# Patient Record
Sex: Female | Born: 1990 | Race: White | Hispanic: No | Marital: Married | Smoking: Never smoker
Health system: Southern US, Community
[De-identification: ages and names within clinical notes are randomized; demographics above are authoritative.]

## PROBLEM LIST (undated history)

## (undated) DIAGNOSIS — D219 Benign neoplasm of connective and other soft tissue, unspecified: Secondary | ICD-10-CM

## (undated) DIAGNOSIS — F419 Anxiety disorder, unspecified: Secondary | ICD-10-CM

## (undated) DIAGNOSIS — O139 Gestational [pregnancy-induced] hypertension without significant proteinuria, unspecified trimester: Secondary | ICD-10-CM

## (undated) DIAGNOSIS — F32A Depression, unspecified: Secondary | ICD-10-CM

## (undated) DIAGNOSIS — K219 Gastro-esophageal reflux disease without esophagitis: Secondary | ICD-10-CM

## (undated) DIAGNOSIS — Z8619 Personal history of other infectious and parasitic diseases: Secondary | ICD-10-CM

## (undated) HISTORY — DX: Anxiety disorder, unspecified: F41.9

## (undated) HISTORY — DX: Benign neoplasm of connective and other soft tissue, unspecified: D21.9

## (undated) HISTORY — DX: Gestational (pregnancy-induced) hypertension without significant proteinuria, unspecified trimester: O13.9

## (undated) HISTORY — DX: Personal history of other infectious and parasitic diseases: Z86.19

## (undated) HISTORY — PX: NECK SURGERY: SHX720

## (undated) HISTORY — DX: Depression, unspecified: F32.A

## (undated) HISTORY — DX: Gastro-esophageal reflux disease without esophagitis: K21.9

---
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
## 2010-10-11 LAB — HM PAP SMEAR: HM Pap smear: NEGATIVE

## 2012-04-10 HISTORY — PX: WISDOM TOOTH EXTRACTION: SHX21

## 2012-09-03 ENCOUNTER — Encounter (HOSPITAL_COMMUNITY): Payer: Self-pay | Admitting: Emergency Medicine

## 2012-09-03 ENCOUNTER — Emergency Department (HOSPITAL_COMMUNITY)
Admission: EM | Admit: 2012-09-03 | Discharge: 2012-09-03 | Disposition: A | Discharge status: Home / Self Care | Payer: 59 | Attending: Emergency Medicine | Admitting: Emergency Medicine

## 2012-09-03 ENCOUNTER — Emergency Department (HOSPITAL_COMMUNITY): Payer: 59

## 2012-09-03 DIAGNOSIS — Y9349 Activity, other involving dancing and other rhythmic movements: Secondary | ICD-10-CM | POA: Insufficient documentation

## 2012-09-03 DIAGNOSIS — S93409A Sprain of unspecified ligament of unspecified ankle, initial encounter: Secondary | ICD-10-CM | POA: Insufficient documentation

## 2012-09-03 DIAGNOSIS — Y9229 Other specified public building as the place of occurrence of the external cause: Secondary | ICD-10-CM | POA: Insufficient documentation

## 2012-09-03 DIAGNOSIS — S93401A Sprain of unspecified ligament of right ankle, initial encounter: Secondary | ICD-10-CM

## 2012-09-03 DIAGNOSIS — X503XXA Overexertion from repetitive movements, initial encounter: Secondary | ICD-10-CM | POA: Insufficient documentation

## 2012-09-03 MED ORDER — HYDROCODONE-ACETAMINOPHEN 5-325 MG PO TABS: 1.0000 | ORAL_TABLET | ORAL | Status: AC | PRN

## 2012-09-03 NOTE — ED Notes (Signed)
Pt c.o right foot/ankle pain/swelling after twisting it last night.

## 2012-09-04 NOTE — ED Provider Notes (Signed)
Medical screening examination/treatment/procedure(s) were performed by non-physician practitioner and as supervising physician I was immediately available for consultation/collaboration.   Benny Lennert, MD 09/04/12 2159

## 2012-09-04 NOTE — ED Provider Notes (Signed)
History     CSN: [id number]  Arrival date & time 09/03/12  7829   First MD Initiated Contact with Patient 09/03/12 1019      Chief Complaint  Patient presents with  . Foot Injury  . Ankle Pain    (Consider location/radiation/quality/duration/timing/severity/associated sxs/prior treatment) HPI Comments: Cynthia Robertson presents with pain and swelling of her right ankle and foot after twisting it last night during a local band performance.  She has taken naproxen and used elevation and ice without improvement in pain.  She has not been able to bear weight on the injured foot. She denies numbness or weakness distal to the injury site and denies any other injury.  Patient is a 21 y.o. female presenting with ankle pain. The history is provided by the patient and a parent.  Ankle Pain  Pertinent negatives include no numbness.    History reviewed. No pertinent past medical history.  History reviewed. No pertinent past surgical history.  No family history on file.  History  Substance Use Topics  . Smoking status: Never Smoker   . Smokeless tobacco: Not on file  . Alcohol Use: Yes     Comment: occasional    OB History    Grav Para Term Preterm Abortions TAB SAB Ect Mult Living                  Review of Systems  Musculoskeletal: Positive for joint swelling and arthralgias.  Skin: Negative for wound.  Neurological: Negative for weakness and numbness.    Allergies  Review of patient's allergies indicates no known allergies.  Home Medications   Current Outpatient Rx  Name  Route  Sig  Dispense  Refill  . NAPROXEN SODIUM 220 MG PO CAPS   Oral   Take 1 capsule by mouth daily as needed. Patient took 1 gelcap this morning for ankle pain         . NORETHIN ACE-ETH ESTRAD-FE 1-20 MG-MCG PO TABS   Oral   Take 1 tablet by mouth daily. 3 weeks on and 1 week off         . HYDROCODONE-ACETAMINOPHEN 5-325 MG PO TABS   Oral   Take 1 tablet by mouth every 4 (four) hours as  needed for pain.   15 tablet   0     BP 135/69  Pulse 96  Temp 98.4 F (36.9 C)  Resp 18  Ht 5\' 8"  (1.727 m)  Wt 192 lb (87.091 kg)  BMI 29.19 kg/m2  SpO2 98%  LMP 08/27/2012  Physical Exam  Nursing note and vitals reviewed. Constitutional: She appears well-developed and well-nourished.  HENT:  Head: Normocephalic.  Cardiovascular: Normal rate and intact distal pulses.  Exam reveals no decreased pulses.   Pulses:      Dorsalis pedis pulses are 2+ on the right side, and 2+ on the left side.       Posterior tibial pulses are 2+ on the right side, and 2+ on the left side.  Musculoskeletal: She exhibits edema and tenderness.       Right ankle: She exhibits decreased range of motion, swelling and ecchymosis. She exhibits no deformity and normal pulse. tenderness. Lateral malleolus tenderness found. No head of 5th metatarsal and no proximal fibula tenderness found. Achilles tendon normal.  Neurological: She is alert. No sensory deficit.  Skin: Skin is warm, dry and intact.    ED Course  Procedures (including critical care time)  Labs Reviewed - No data to display Dg  Ankle Complete Right  09/03/2012  *RADIOLOGY REPORT*  Clinical Data:  Fall.  Ankle injury and pain.  RIGHT ANKLE - COMPLETE 3+ VIEW  Comparison:  None.  Findings:  There is no evidence of fracture, dislocation, or joint effusion.  There is no evidence of arthropathy or other focal bone abnormality.  Soft tissues are unremarkable.  IMPRESSION: Negative.   Original Report Authenticated By: Myles Rosenthal, M.D.    Dg Foot Complete Right  09/03/2012  *RADIOLOGY REPORT*  Clinical Data: Fall.  Foot injury and pain.  RIGHT FOOT COMPLETE - 3+ VIEW  Comparison:  None.  Findings:  There is no evidence of fracture or dislocation.  There is no evidence of arthropathy or other focal bone abnormality. Mild soft tissue swelling is seen along the lateral aspect of the mid and hind foot.  IMPRESSION:  Mild lateral soft tissue swelling.  No  evidence of fracture or dislocation.   Original Report Authenticated By: Myles Rosenthal, M.D.      1. Right ankle sprain       MDM  xrays reviewed.  Pt given crutches,  [place].  Prescribed hydrocodone, encouraged continued naproxen.  RICE,  Recheck in 1 week if not improved.        [name], [place] 09/04/12 2111

## 2012-12-09 ENCOUNTER — Emergency Department (HOSPITAL_COMMUNITY)
Admission: EM | Admit: 2012-12-09 | Discharge: 2012-12-09 | Discharge status: Against Medical Advice | Payer: 59 | Attending: Emergency Medicine | Admitting: Emergency Medicine

## 2012-12-09 ENCOUNTER — Emergency Department (HOSPITAL_COMMUNITY): Payer: 59

## 2012-12-09 ENCOUNTER — Encounter (HOSPITAL_COMMUNITY): Payer: Self-pay | Admitting: Physical Medicine and Rehabilitation

## 2012-12-09 DIAGNOSIS — R000 Tachycardia, unspecified: Secondary | ICD-10-CM

## 2012-12-09 DIAGNOSIS — R002 Palpitations: Secondary | ICD-10-CM | POA: Insufficient documentation

## 2012-12-09 DIAGNOSIS — R079 Chest pain, unspecified: Secondary | ICD-10-CM | POA: Insufficient documentation

## 2012-12-09 DIAGNOSIS — Z79899 Other long term (current) drug therapy: Secondary | ICD-10-CM | POA: Insufficient documentation

## 2012-12-09 DIAGNOSIS — F410 Panic disorder [episodic paroxysmal anxiety] without agoraphobia: Secondary | ICD-10-CM | POA: Insufficient documentation

## 2012-12-09 DIAGNOSIS — R42 Dizziness and giddiness: Secondary | ICD-10-CM | POA: Insufficient documentation

## 2012-12-09 LAB — BASIC METABOLIC PANEL
BUN: 14 mg/dL (ref 6–23)
CO2: 25 mEq/L (ref 19–32)
Chloride: 102 mEq/L (ref 96–112)
GFR calc Af Amer: >90 mL/min (ref >90)
Glucose, Bld: 100 mg/dL — ABNORMAL HIGH (ref 70–99)
Potassium: 3.7 mEq/L (ref 3.5–5.1)

## 2012-12-09 LAB — CBC WITH DIFFERENTIAL/PLATELET
Basophils Relative: 1 % (ref 0–1)
HCT: 37.1 % (ref 36.0–46.0)
Hemoglobin: 12.8 g/dL (ref 12.0–15.0)
MCH: 31.8 pg (ref 26.0–34.0)
MCHC: 34.5 g/dL (ref 30.0–36.0)
Monocytes Absolute: 0.5 10*3/uL (ref 0.1–1.0)
Monocytes Relative: 6 % (ref 3–12)
Neutro Abs: 5.6 10*3/uL (ref 1.7–7.7)

## 2012-12-09 NOTE — ED Notes (Signed)
Pt presents to department for evaluation of SOB and chest tightness. Sudden onset this afternoon after eating crab meat in sushi roll. Upon arrival pt states chest tightness, heart racing and shortness of breath. Pt is conscious alert and oriented x4. Respirations unlabored. Speaking complete sentences.

## 2012-12-09 NOTE — ED Provider Notes (Signed)
History     CSN: [id number]  Arrival date & time 12/09/12  1724   First MD Initiated Contact with Patient 12/09/12 1805      Chief Complaint  Patient presents with  . Shortness of Breath    (Consider location/radiation/quality/duration/timing/severity/associated sxs/prior treatment) HPI Comments: 22 y.o. Female presents with heart palpitations and chest tightness after eating crab meat and sushi. Pt states she has eaten crab meat before, not sushi, did not think she was having an allergic reaction, but started to "feel funny," and then started to panic. Went out to parking lot to leave. Drove to hospital just to "get checked out." Took no interventions. Nothing made it better or worse. She admits feeling light-headed, hot, heart palpitating, felt like she was going to pass out. Denies visual changes, headache, nausea, no chest pain, shortness of breath.  Pt not in any distress, any pain right now.   No significant PMHx. Pt takes over the counter acid reflux meds.  Patient is a 22 y.o. female presenting with shortness of breath.  Shortness of Breath Associated symptoms: no chest pain, no diaphoresis, no fever, no headaches, no neck pain, no rash and no vomiting     No past medical history on file.  No past surgical history on file.  No family history on file.  History  Substance Use Topics  . Smoking status: Never Smoker   . Smokeless tobacco: Not on file  . Alcohol Use: Yes     Comment: occasional    OB History   Grav Para Term Preterm Abortions TAB SAB Ect Mult Living                  Review of Systems  Constitutional: Negative for fever and diaphoresis.  HENT: Negative for neck pain and neck stiffness.   Eyes: Negative for visual disturbance.  Respiratory: Positive for shortness of breath. Negative for apnea and chest tightness.   Cardiovascular: Negative for chest pain and palpitations.  Gastrointestinal: Negative for nausea, vomiting, diarrhea and constipation.   Genitourinary: Negative for dysuria.  Musculoskeletal: Negative for gait problem.  Skin: Negative for rash.  Neurological: Positive for light-headedness. Negative for dizziness, weakness, numbness and headaches.    Allergies  Review of patient's allergies indicates no known allergies.  Home Medications   Current Outpatient Rx  Name  Route  Sig  Dispense  Refill  . norethindrone-ethinyl estradiol (JUNEL FE,GILDESS FE,LOESTRIN FE) 1-20 MG-MCG tablet   Oral   Take 1 tablet by mouth daily. 3 weeks on and 1 week off           BP 146/80  Pulse 132  Temp(Src) 98.8 F (37.1 C) (Oral)  Resp 20  SpO2 100%  LMP 12/01/2012  Physical Exam  Nursing note and vitals reviewed. Constitutional: She is oriented to person, place, and time. She appears well-developed and well-nourished. No distress.  HENT:  Head: Normocephalic and atraumatic.  Eyes: EOM are normal. Pupils are equal, round, and reactive to light.  Neck: Normal range of motion. Neck supple.  No meningeal signs  Cardiovascular: Regular rhythm and normal heart sounds.  Exam reveals no gallop and no friction rub.   No murmur heard. Tachycardic at 102 on initial exam. Reg rate at 86 on re-evaluation  Pulmonary/Chest: Effort normal and breath sounds normal. No respiratory distress. She has no wheezes. She has no rales. She exhibits no tenderness.  Abdominal: Soft. Bowel sounds are normal. She exhibits no distension. There is no tenderness. There is no  rebound and no guarding.  Musculoskeletal: Normal range of motion. She exhibits no edema and no tenderness.  5/5 muscle strength throughout.  Neurological: She is alert and oriented to person, place, and time. No cranial nerve deficit.  No focal deficits. Sensation to light touch intact.  Skin: Skin is warm and dry. She is not diaphoretic. No erythema.    ED Course  Procedures (including critical care time)  Labs Reviewed  BASIC METABOLIC PANEL  CBC WITH DIFFERENTIAL   Dg  Chest 2 View  12/09/2012  *RADIOLOGY REPORT*  Clinical Data: Shortness of breath, chest pressure  CHEST - 2 VIEW  Comparison: None.  Findings: Lungs are clear. No pleural effusion or pneumothorax.  Cardiomediastinal silhouette is within normal limits.  Visualized osseous structures are within normal limits.  IMPRESSION: No evidence of acute cardiopulmonary disease.   Original Report Authenticated By: Charline Bills, M.D.     Date: 12/09/2012  Rate: 125  Rhythm: sinus tachycardia  QRS Axis: normal  Intervals: normal  ST/T Wave abnormalities: normal  Conduction Disutrbances: none  Narrative Interpretation: otherwise normal EKG  Old EKG Reviewed: None available    Diagnosis: palpitations, tachycardia   MDM  Consider ACS vs PE vs anaphylaxis vs anxiety. Quickly ruled out anaphylaxis. Patent airway, no swelling, no airway compromise/respiratory distress, no wheals, no pruritis, hemodynamically stable. R/o ACS: EKG showed sinus tach, otherwise normal EKG, denies chest pain, numbness, nausea/vomiting, HA, visual changes. Pt arrived with tachycardia, but quickly resolved into the 80s. Pt denies a history of travel, immobilization, surgery, fevers, cancer, oral contraceptives (stopped over a month ago) or hormone use, swelling of the legs. The patient has no history of venous thromboembolism.  Review of labs, imaging, EKG, physical exam including vital signs, determined that at this time there does not appear to be any evidence of an acute emergency medical condition and the patient appears stable for discharge with appropriate outpatient follow up. Provided resource list and discussed importance of establishing relationship with PCP. Diagnosis was discussed with patient who verbalizes understanding and is agreeable to discharge. Pt case discussed with Dr. Rubin Payor who agrees with my plan.     Glade Nurse, PA-C 12/09/12 2055

## 2012-12-10 NOTE — ED Provider Notes (Signed)
Medical screening examination/treatment/procedure(s) were performed by non-physician practitioner and as supervising physician I was immediately available for consultation/collaboration.  Juliet Rude. Rubin Payor, MD 12/10/12 0005

## 2013-01-22 ENCOUNTER — Encounter: Payer: Self-pay | Admitting: Obstetrics and Gynecology

## 2013-01-22 ENCOUNTER — Ambulatory Visit (INDEPENDENT_AMBULATORY_CARE_PROVIDER_SITE_OTHER): Payer: 59 | Admitting: Obstetrics and Gynecology

## 2013-01-22 VITALS — BP 136/80 | Ht 67.5 in | Wt 210.0 lb

## 2013-01-22 DIAGNOSIS — Z0000 Encounter for general adult medical examination without abnormal findings: Secondary | ICD-10-CM

## 2013-01-22 DIAGNOSIS — Z01419 Encounter for gynecological examination (general) (routine) without abnormal findings: Secondary | ICD-10-CM

## 2013-01-22 DIAGNOSIS — F411 Generalized anxiety disorder: Secondary | ICD-10-CM | POA: Insufficient documentation

## 2013-01-22 LAB — TSH: TSH: 3.731 u[IU]/mL (ref 0.350–4.500)

## 2013-01-22 LAB — POCT URINALYSIS DIPSTICK
Bilirubin, UA: NEGATIVE
Blood, UA: NEGATIVE
Glucose, UA: NEGATIVE
Urobilinogen, UA: NEGATIVE

## 2013-01-22 MED ORDER — NORGESTIMATE-ETH ESTRADIOL 0.25-35 MG-MCG PO TABS: 1.0000 | ORAL_TABLET | Freq: Every day | ORAL | Status: DC

## 2013-01-22 MED ORDER — CITALOPRAM HYDROBROMIDE 20 MG PO TABS: 20.0000 mg | ORAL_TABLET | Freq: Every day | ORAL | Status: DC

## 2013-01-22 NOTE — Patient Instructions (Signed)
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
## 2013-01-24 ENCOUNTER — Encounter (HOSPITAL_COMMUNITY): Payer: Self-pay | Admitting: *Deleted

## 2013-01-24 ENCOUNTER — Emergency Department (HOSPITAL_COMMUNITY): Payer: 59

## 2013-01-24 ENCOUNTER — Other Ambulatory Visit: Payer: Self-pay

## 2013-01-24 ENCOUNTER — Emergency Department (HOSPITAL_COMMUNITY)
Admission: EM | Admit: 2013-01-24 | Discharge: 2013-01-24 | Disposition: A | Discharge status: Home / Self Care | Payer: 59 | Attending: Emergency Medicine | Admitting: Emergency Medicine

## 2013-01-24 DIAGNOSIS — R209 Unspecified disturbances of skin sensation: Secondary | ICD-10-CM | POA: Insufficient documentation

## 2013-01-24 DIAGNOSIS — Z79899 Other long term (current) drug therapy: Secondary | ICD-10-CM | POA: Insufficient documentation

## 2013-01-24 DIAGNOSIS — F410 Panic disorder [episodic paroxysmal anxiety] without agoraphobia: Secondary | ICD-10-CM | POA: Insufficient documentation

## 2013-01-24 LAB — POCT I-STAT TROPONIN I: Troponin i, poc: 0 ng/mL (ref 0.00–0.08)

## 2013-01-24 LAB — POCT I-STAT, CHEM 8
HCT: 41 % (ref 36.0–46.0)
Hemoglobin: 13.9 g/dL (ref 12.0–15.0)
Potassium: 3.7 mEq/L (ref 3.5–5.1)
Sodium: 138 mEq/L (ref 135–145)

## 2013-01-24 LAB — IPS PAP TEST WITH HPV

## 2013-01-24 MED ORDER — LORAZEPAM 1 MG PO TABS
0.5000 mg | ORAL_TABLET | Freq: Once | ORAL | Status: AC
Start: 2013-01-24 — End: 2013-01-24
  Administered 2013-01-24: 0.5 mg via ORAL
  Filled 2013-01-24: qty 1

## 2013-01-24 MED ORDER — LORAZEPAM 1 MG PO TABS: 1.0000 mg | ORAL_TABLET | Freq: Three times a day (TID) | ORAL | Status: DC | PRN

## 2013-01-24 NOTE — ED Notes (Addendum)
PT was tx here in March for "fast HR" and chest pain.  Ever since Sat, pt has been having increasing chest tightness, sob, dizziness and whole body numbness. Pt states her obgyn gave her a celexa which she took on Monday aft and ended up having the worst "episode" she's had that night.

## 2013-01-24 NOTE — ED Notes (Signed)
Pt transported to XR.  

## 2013-01-24 NOTE — ED Notes (Signed)
PA-C at bedside assessing pt.

## 2013-01-24 NOTE — ED Notes (Signed)
Pt reports episodes of tachycardia that last for a few minutes. Pt reports during episode her entire body gets flushed and warm. Pt states these episodes started in March and happen intermittently. Pt is not reporting CP at this time, but had non radiating Cp yesterday.

## 2013-01-24 NOTE — ED Provider Notes (Signed)
History     CSN: [id number]  Arrival date & time 01/24/13  1254   First MD Initiated Contact with Patient 01/24/13 1358      Chief Complaint  Patient presents with  . Chest Pain    (Consider location/radiation/quality/duration/timing/severity/associated sxs/prior treatment) HPI Comments: Patient presents to the emergency department for recurrent episodes of chest pain and anxiety.  Pain is described as sharp, midsternal, non-radiating but is associated with paresthesias and burning sensation in her upper extremities, last episode occurring Monday evening.  Patient has hx of anxiety, last evaluated in the ED 12/18/12.  Patient was seen by her gynecologist on Monday, her birth control was changed and she was started on Celexa to control her anxiety.  Pt reports another anxiety attack after taking the first dose of Celexa, she elected not to take any more of the medication.  The patient continues to have fear of further anxiety attacks. Denies and current chest pain, shortness of breath, palpitations, dizziness, numbness or paresthesias of UE.  Family hx of panic disorder.  No recent increase in stress or familial strain.  The history is provided by the patient.    Past Medical History  Diagnosis Date  . Anxiety     Past Surgical History  Procedure Laterality Date  . Wisdom tooth extraction  04/2012    Family History  Problem Relation Age of Onset  . Hyperlipidemia Mother   . Stroke Maternal Grandfather     History  Substance Use Topics  . Smoking status: Never Smoker   . Smokeless tobacco: Not on file  . Alcohol Use: Yes     Comment: Occ Beer,Alcohol    OB History   Grav Para Term Preterm Abortions TAB SAB Ect Mult Living                  Review of Systems  Cardiovascular: Positive for chest pain.  Psychiatric/Behavioral: The patient is nervous/anxious.   All other systems reviewed and are negative.    Allergies  Review of patient's allergies indicates no known  allergies.  Home Medications   Current Outpatient Rx  Name  Route  Sig  Dispense  Refill  . omeprazole (PRILOSEC) 20 MG capsule   Oral   Take 20 mg by mouth daily as needed (for heartburn).          . citalopram (CELEXA) 20 MG tablet   Oral   Take 20 mg by mouth once.           BP 137/70  Pulse 67  Temp(Src) 98 F (36.7 C) (Oral)  Resp 20  Ht 5' 7.5" (1.715 m)  Wt 210 lb (95.255 kg)  BMI 32.39 kg/m2  SpO2 100%  LMP 01/02/2013  Physical Exam  Nursing note and vitals reviewed. Constitutional: She is oriented to person, place, and time. She appears well-developed and well-nourished.  HENT:  Head: Normocephalic and atraumatic.  Mouth/Throat: Oropharynx is clear and moist.  Eyes: Conjunctivae and EOM are normal. Pupils are equal, round, and reactive to light.  Neck: Normal range of motion.  Cardiovascular: Normal rate, regular rhythm and normal heart sounds.   Pulmonary/Chest: Effort normal and breath sounds normal.  Abdominal: Soft. Bowel sounds are normal.  Musculoskeletal: Normal range of motion.  Neurological: She is alert and oriented to person, place, and time. She has normal strength. She displays no tremor. No cranial nerve deficit or sensory deficit. She displays no seizure activity. Gait normal.  Skin: Skin is warm and dry.  Psychiatric:  Her speech is normal and behavior is normal. Her mood appears anxious.    ED Course  Procedures (including critical care time)   Date: 01/24/2013  Rate: 75  Rhythm: normal sinus rhythm  QRS Axis: normal  Intervals: normal  ST/T Wave abnormalities: normal  Conduction Disutrbances:none  Narrative Interpretation: normal EKG, no STEMI  Old EKG Reviewed: unchanged    Labs Reviewed  POCT I-STAT, CHEM 8  POCT I-STAT TROPONIN I   Dg Chest 2 View  01/24/2013  *RADIOLOGY REPORT*  Clinical Data: Chest pain and shortness of breath  CHEST - 2 VIEW  Comparison: 12/09/2012  Findings: The lungs are clear without focal  infiltrate, edema, pneumothorax or pleural effusion. The cardiopericardial silhouette is within normal limits for size. Imaged bony structures of the thorax are intact.  IMPRESSION: Stable.  No acute cardiopulmonary process.   Original Report Authenticated By: Kennith Center, M.D.      1. Anxiety attack       MDM   Patient presented to the emergency department for midsternal chest pain that appears to be anxiety related.  Vital signs are stable upon arrival, no neuro deficits, but the patient remains anxious and fearful of another panic attack.. Recently started on Celexa and had a panic attack after the first dose so discontinued the medication.  Will give 0.5mg  Ativan in the ED and observe.   Patient reports she is somewhat more relaxed after Ativan on but is still anxious about having another attack.  No episodes of chest pain, SOB, numbness or paresthesias of upper extremities while in the ED.  Low suspicion that pts episodes of chest pain are cardiac in nature.  Information given for behavioral health clinic if she would like further evaluation of anxiety disorder.  If not, patient has a previously scheduled followup appointment with her primary care physician.  Rx ativan PRN until evaluation.  Discussed with patient who is agreeable to plan. Return precautions advised.          Garlon Hatchet, PA-C 01/24/13 1800

## 2013-01-24 NOTE — ED Notes (Signed)
Pt is flushed in the face after medicine. Pt states her face is warm but the rest of her body is cold. Skin is bright red and warm to touch, temp is WNL.

## 2013-01-24 NOTE — ED Provider Notes (Signed)
Medical screening examination/treatment/procedure(s) were performed by non-physician practitioner and as supervising physician I was immediately available for consultation/collaboration.    Dione Booze, MD 01/24/13 2013

## 2013-01-24 NOTE — ED Notes (Signed)
Pt. returned from XR. 

## 2013-01-29 ENCOUNTER — Telehealth: Payer: Self-pay | Admitting: Obstetrics and Gynecology

## 2013-01-29 NOTE — Telephone Encounter (Signed)
Can you get me this chart?  Note in EPIC says TSH was checked.  I see no lab results from that day except HB.

## 2013-01-29 NOTE — Telephone Encounter (Signed)
Spoke with pt who saw CR for her AEX last Monday. Was put on Celexa, but pt says it made her "feel weird" so she stopped taking it. Pt was called and told her thyroid was low, but that she couldn't take medication for it because of the Celexa. Pt is wondering if she needs to be on thyroid medication since she is no longer taking Celexa. Please advise.

## 2013-01-29 NOTE — Telephone Encounter (Signed)
Disregard previous message.  Found the TSH.  It is NOT abnormal just on the LOW side of NORMAL--as stated in the note from Dr. Precious Bard.  She wanted the patient to retest it in three to four months to see if changing.  Right now, there is no reason to start thyroid medication.  May need another OV to discuss alternative antianxiety treatments since she stopped the Celexa.

## 2013-01-29 NOTE — Telephone Encounter (Signed)
Spoke with pt about thyroid level being on the low side of normal and that pt does not need medication at this time. Pt to come back in August to have it rechecked. Offered OV to discuss another antianxiety med, but pt says she is doing better with anxiety and would rather not take medication if possible. Pt to call back if she decides otherwise.

## 2013-01-29 NOTE — Telephone Encounter (Signed)
PT WANTS TO DISCUSS A MEDICATION SHE IS ON AND HAS QUESTIONS REGARDING THE HER THYRIOD

## 2013-02-05 ENCOUNTER — Telehealth: Payer: Self-pay | Admitting: Family Medicine

## 2013-02-06 ENCOUNTER — Ambulatory Visit (INDEPENDENT_AMBULATORY_CARE_PROVIDER_SITE_OTHER): Payer: 59 | Admitting: Family

## 2013-02-06 ENCOUNTER — Encounter: Payer: Self-pay | Admitting: Family

## 2013-02-06 VITALS — BP 148/78 | HR 106 | Temp 98.6°F | Resp 20 | Ht 68.25 in | Wt 199.1 lb

## 2013-02-06 DIAGNOSIS — F411 Generalized anxiety disorder: Secondary | ICD-10-CM

## 2013-02-06 MED ORDER — ALPRAZOLAM 0.5 MG PO TABS: 0.5000 mg | ORAL_TABLET | Freq: Two times a day (BID) | ORAL | Status: DC | PRN

## 2013-02-06 MED ORDER — ESCITALOPRAM OXALATE 10 MG PO TABS
10.0000 mg | ORAL_TABLET | Freq: Every day | ORAL | Status: DC
Start: 2013-02-06 — End: 2013-02-08

## 2013-02-06 NOTE — Progress Notes (Signed)
Subjective:    Patient ID: Cynthia Robertson, female    DOB: 21-Feb-1991, 22 y.o.   MRN: [id number]  HPI  Pt new to establish care. She is accompanied today by her mother's sister.  She was seen in the ED last week with episode of rapid heart rate, hyperventialtion, chest pain, tingling in her hands, feels like she can't breathe. She was diagnosed with anxiety attack. She reported similar symptoms recently to her GYN who prescribed citalopram. She only took one dose.  She had a panic attack that same dose and did not take any further doses. Episodes started around the first of March and have been increasing in frequency. Felt like Citalpram worsened her symptoms. TSH, BMET, CXR, EKG and hemoglobin were performed in the last few weeks and were all noted to be normal.    Pt's Aunt notes that last year one of the patient's best friends was murdered.  This has been very hard for her. The pt reports extreme fear of dying.  Review of Systems See HPI  Past Medical History  Diagnosis Date  . Anxiety   . History of chicken pox   . GERD (gastroesophageal reflux disease)     History   Social History  . Marital Status: Single    Spouse Name: N/A    Number of Children: N/A  . Years of Education: N/A   Occupational History  . Not on file.   Social History Main Topics  . Smoking status: Never Smoker   . Smokeless tobacco: Not on file  . Alcohol Use: Yes     Comment: Occ Beer,Alcohol  . Drug Use: No  . Sexually Active: Yes -- Female partner(s)    Birth Control/ Protection: Condom, Pill   Other Topics Concern  . 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 Wisdom tooth extraction  04/2012    Family History  Problem Relation Age of Onset  . Hyperlipidemia Mother   . Stroke  Maternal Grandfather     No Known Allergies  No current facility-administered medications on file prior to visit.   Current Outpatient Prescriptions on File Prior to Visit  Medication Sig Dispense Refill  . omeprazole (PRILOSEC) 20 MG capsule Take 20 mg by mouth daily as needed (for heartburn).         BP 148/78  Pulse 106  Temp(Src) 98.6 F (37 C) (Oral)  Resp 20  Ht 5' 8.25" (1.734 m)  Wt 199 lb 1.3 oz (90.302 kg)  BMI 30.03 kg/m2  SpO2 100%  LMP 02/04/2013       Objective:   Physical Exam  Constitutional: She is oriented to person, place, and time. She appears well-developed and well-nourished. No distress.  Cardiovascular: Normal rate and regular rhythm.   No murmur heard. Pulmonary/Chest: Effort normal and breath sounds normal. No respiratory distress. She has no wheezes. She has no rales. She exhibits no tenderness.  Musculoskeletal: She exhibits no edema.  Neurological: She is alert and oriented to person, place, and time.  Psychiatric: Her speech is normal and behavior is normal. Judgment and thought content normal. Her mood appears anxious. Cognition and memory are normal.          Assessment &  Plan:

## 2013-02-06 NOTE — Patient Instructions (Addendum)
Lexapro 10mg - start 1/2 tablet by mouth daily for one week, then increase to full tablet once daily on week two. You will be contacted about your referral to the therapist. Follow up in 1 month for a fasting physical. Welcome to [facility]!

## 2013-02-08 ENCOUNTER — Encounter (HOSPITAL_COMMUNITY): Payer: Self-pay | Admitting: *Deleted

## 2013-02-08 ENCOUNTER — Inpatient Hospital Stay (HOSPITAL_COMMUNITY)
Admission: AD | Admit: 2013-02-08 | Discharge: 2013-02-12 | DRG: 880 | Disposition: A | Discharge status: Home / Self Care | Payer: 59 | Source: Ambulatory Visit | Attending: Emergency Medicine | Admitting: Emergency Medicine

## 2013-02-08 ENCOUNTER — Encounter (HOSPITAL_COMMUNITY): Payer: Self-pay | Admitting: Emergency Medicine

## 2013-02-08 ENCOUNTER — Emergency Department (HOSPITAL_COMMUNITY)
Admission: EM | Admit: 2013-02-08 | Discharge: 2013-02-08 | Disposition: A | Discharge status: Other Acute Inpatient Hospital | Payer: 59 | Attending: Emergency Medicine | Admitting: Emergency Medicine

## 2013-02-08 DIAGNOSIS — Z79899 Other long term (current) drug therapy: Secondary | ICD-10-CM | POA: Insufficient documentation

## 2013-02-08 DIAGNOSIS — F410 Panic disorder [episodic paroxysmal anxiety] without agoraphobia: Secondary | ICD-10-CM

## 2013-02-08 DIAGNOSIS — R0602 Shortness of breath: Secondary | ICD-10-CM

## 2013-02-08 DIAGNOSIS — I498 Other specified cardiac arrhythmias: Secondary | ICD-10-CM | POA: Diagnosis present

## 2013-02-08 DIAGNOSIS — K219 Gastro-esophageal reflux disease without esophagitis: Secondary | ICD-10-CM | POA: Insufficient documentation

## 2013-02-08 DIAGNOSIS — F411 Generalized anxiety disorder: Secondary | ICD-10-CM | POA: Insufficient documentation

## 2013-02-08 DIAGNOSIS — Z3202 Encounter for pregnancy test, result negative: Secondary | ICD-10-CM | POA: Insufficient documentation

## 2013-02-08 DIAGNOSIS — F329 Major depressive disorder, single episode, unspecified: Secondary | ICD-10-CM | POA: Diagnosis present

## 2013-02-08 DIAGNOSIS — R000 Tachycardia, unspecified: Secondary | ICD-10-CM | POA: Clinically undetermined

## 2013-02-08 DIAGNOSIS — I471 Supraventricular tachycardia: Secondary | ICD-10-CM

## 2013-02-08 DIAGNOSIS — Z8619 Personal history of other infectious and parasitic diseases: Secondary | ICD-10-CM | POA: Insufficient documentation

## 2013-02-08 DIAGNOSIS — R45851 Suicidal ideations: Secondary | ICD-10-CM

## 2013-02-08 DIAGNOSIS — F3289 Other specified depressive episodes: Secondary | ICD-10-CM | POA: Diagnosis present

## 2013-02-08 LAB — COMPREHENSIVE METABOLIC PANEL
AST: 20 U/L (ref 0–37)
Albumin: 4.2 g/dL (ref 3.5–5.2)
Alkaline Phosphatase: 70 U/L (ref 39–117)
BUN: 6 mg/dL (ref 6–23)
Chloride: 105 mEq/L (ref 96–112)
Creatinine, Ser: 0.8 mg/dL (ref 0.50–1.10)
Potassium: 3.4 mEq/L — ABNORMAL LOW (ref 3.5–5.1)
Total Bilirubin: 0.5 mg/dL (ref 0.3–1.2)
Total Protein: 7.3 g/dL (ref 6.0–8.3)

## 2013-02-08 LAB — POCT PREGNANCY, URINE: Preg Test, Ur: NEGATIVE

## 2013-02-08 LAB — URINALYSIS, ROUTINE W REFLEX MICROSCOPIC
Bilirubin Urine: NEGATIVE
Glucose, UA: NEGATIVE mg/dL
Ketones, ur: >80 mg/dL — AB
Protein, ur: NEGATIVE mg/dL
Urobilinogen, UA: 0.2 mg/dL (ref 0.0–1.0)

## 2013-02-08 LAB — URINE MICROSCOPIC-ADD ON

## 2013-02-08 LAB — CBC
HCT: 38.1 % (ref 36.0–46.0)
Hemoglobin: 13.8 g/dL (ref 12.0–15.0)
MCH: 32.2 pg (ref 26.0–34.0)
MCHC: 36.2 g/dL — ABNORMAL HIGH (ref 30.0–36.0)
RBC: 4.28 MIL/uL (ref 3.87–5.11)

## 2013-02-08 LAB — RAPID URINE DRUG SCREEN, HOSP PERFORMED
Amphetamines: NOT DETECTED
Barbiturates: NOT DETECTED
Opiates: NOT DETECTED
Tetrahydrocannabinol: NOT DETECTED

## 2013-02-08 MED ORDER — ACETAMINOPHEN 325 MG PO TABS: 650.0000 mg | ORAL_TABLET | ORAL | Status: DC | PRN

## 2013-02-08 MED ORDER — HYDROXYZINE HCL 25 MG PO TABS
25.0000 mg | ORAL_TABLET | ORAL | Status: DC | PRN
  Administered 2013-02-08 – 2013-02-11 (×9): 25 mg via ORAL

## 2013-02-08 MED ORDER — NORGESTIMATE-ETH ESTRADIOL 0.25-35 MG-MCG PO TABS: 1.0000 | ORAL_TABLET | Freq: Every day | ORAL | Status: DC

## 2013-02-08 MED ORDER — NORETHINDRONE-ETH ESTRADIOL 0.4-35 MG-MCG PO TABS
1.0000 | ORAL_TABLET | Freq: Every day | ORAL | Status: DC
  Administered 2013-02-09 – 2013-02-12 (×4): 1 via ORAL
  Filled 2013-02-08 (×7): qty 1

## 2013-02-08 MED ORDER — PANTOPRAZOLE SODIUM 40 MG PO TBEC
40.0000 mg | DELAYED_RELEASE_TABLET | Freq: Every day | ORAL | Status: DC
  Administered 2013-02-08: 40 mg via ORAL
  Filled 2013-02-08: qty 1

## 2013-02-08 MED ORDER — NICOTINE 21 MG/24HR TD PT24
21.0000 mg | MEDICATED_PATCH | Freq: Every day | TRANSDERMAL | Status: DC
  Filled 2013-02-08: qty 1

## 2013-02-08 MED ORDER — ESCITALOPRAM OXALATE 10 MG PO TABS: 5.0000 mg | ORAL_TABLET | Freq: Every day | ORAL | Status: DC

## 2013-02-08 MED ORDER — IBUPROFEN 400 MG PO TABS: 600.0000 mg | ORAL_TABLET | Freq: Three times a day (TID) | ORAL | Status: DC | PRN

## 2013-02-08 MED ORDER — LORAZEPAM 2 MG/ML IJ SOLN
1.0000 mg | Freq: Once | INTRAMUSCULAR | Status: AC
  Administered 2013-02-08: 1 mg via INTRAMUSCULAR
  Filled 2013-02-08: qty 1

## 2013-02-08 MED ORDER — ALPRAZOLAM 0.25 MG PO TABS: 0.5000 mg | ORAL_TABLET | Freq: Three times a day (TID) | ORAL | Status: DC | PRN

## 2013-02-08 MED ORDER — ZOLPIDEM TARTRATE 5 MG PO TABS: 5.0000 mg | ORAL_TABLET | Freq: Every evening | ORAL | Status: DC | PRN

## 2013-02-08 MED ORDER — IBUPROFEN 600 MG PO TABS
600.0000 mg | ORAL_TABLET | Freq: Three times a day (TID) | ORAL | Status: DC
  Administered 2013-02-08 – 2013-02-12 (×11): 600 mg via ORAL
  Filled 2013-02-08 (×8): qty 1
  Filled 2013-02-08: qty 3
  Filled 2013-02-08 (×14): qty 1

## 2013-02-08 MED ORDER — ONDANSETRON HCL 8 MG PO TABS: 4.0000 mg | ORAL_TABLET | Freq: Three times a day (TID) | ORAL | Status: DC | PRN

## 2013-02-08 MED ORDER — ACETAMINOPHEN 325 MG PO TABS: 650.0000 mg | ORAL_TABLET | Freq: Four times a day (QID) | ORAL | Status: DC | PRN

## 2013-02-08 MED ORDER — LORAZEPAM 1 MG PO TABS
1.0000 mg | ORAL_TABLET | Freq: Three times a day (TID) | ORAL | Status: DC
  Administered 2013-02-08: 1 mg via ORAL
  Filled 2013-02-08: qty 1

## 2013-02-08 MED ORDER — ALUM & MAG HYDROXIDE-SIMETH 200-200-20 MG/5ML PO SUSP: 30.0000 mL | ORAL | Status: DC | PRN

## 2013-02-08 NOTE — BH Assessment (Signed)
Assessment Note  Update:  Received call from [facility] stating pt accepted by Shelda Jakes, PA to Dr. Daleen Bo to bed 502-2 and that pt could be transported to [facility].  Updated EDP Effie Shy and ED staff.  Updated assessment disposition and completed support paperwork.  Pt to be transported to [facility] via security, as pt is voluntary.    Disposition:  Disposition Initial Assessment Completed for this Encounter: Yes Disposition of Patient: Inpatient treatment program Type of inpatient treatment program: Adult Patient referred to: Other (Comment) (Pt accepted at [facility])  On Site Evaluation by:   Reviewed with Physician:  Tressia Danas, Rennis Harding 02/08/2013 3:01 PM

## 2013-02-08 NOTE — Progress Notes (Signed)
New admit; pt did not attend [employer].

## 2013-02-08 NOTE — ED Provider Notes (Signed)
She has been accepted at the behavioral health [facility]   Medical screening examination/treatment/procedure(s) were performed by non-physician practitioner and as supervising physician I was immediately available for consultation/collaboration.  Flint Melter, MD 02/08/13 [phone]
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
## 2013-02-08 NOTE — ED Notes (Signed)
C/o heart racing- "i am worried about my heart, it is racing ever since I ate." pulse= 100, reg. Pt reassured- continues to say " I am so worried about my heart! I am afraid something will happen to me and my heart!" told I would check on meds.

## 2013-02-08 NOTE — BH Assessment (Signed)
Assessment Note   Cynthia Robertson is an 22 y.o. female that presented today due to increased panic attacks.  Pt stated these have worsened over the last three weeks "and I can't go on living this way."  Pt reports she has SI with thoughts of overdosing on her anxiety medications to "make this all go away."  Pt does not feels safe alone.  Pt's panic attacks appear incapacitating.  She stated she has 4-8 per day and is "always thinking about the next one."  Pt also endorses sx of depression, stating she has been having crying spells, feels sad, is despondent, has lost 12 lbs in last month, and is not eating or sleeping.  Pt stated she is afraid she will have another attack and that "it is all I think about."  Pt has gone to her PCP and presented to the ED several weeks ago for same sx.  Pt was prescribed Celexa by her PCP, but stated she had the "worst panic attack I have ever had" after taking the first dose and she didn't take it again.  She was seen in ED and given Ativan that she did not take (all of these notes can be seen in [facility]) and she went back this past Tuesday, where her PCP prescribed Lexapro and Xanax.  Pt stated she tried the Xanax but it did not help when she took it for sleep at bedtime.  Pt stated she is afraid the medicine will kill her or that her heart will stop if she takes it.  Pt has no previous inpatient or outpatient psychiatric treatment.  [place] was going to make her an appt, but she never heard back from them by report.  Pt denies HI or psychosis.  When asked if she had any stressors currently, pt stated her father committed suicide 3 years ago and then her best friend died 2 years ago by a traumatic stabbing.  Pt stated she believes her anxiety started then and has been worsening.  However, pt denies current stressors.  Pt was pleasant, cooperative and wants treatment.  Pt's anxiety apparent.  She appeared restless and somewhat shaky.  Pt's mother is here and is very supportive.  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  Pt appears to be a danger to herself at this time and is appropriate for inpatient treatment.  Assessment completed and faxed to [facility] to run for possible admission.  Updated ED staff.  Axis I: 309.81 Posttraumatic Stress Disorder, 300.01 Panic Disorder Without Agoraphobia Axis II: Deferred Axis III:  Past Medical History  Diagnosis Date  . Anxiety   . History of chicken pox   . GERD (gastroesophageal reflux disease)    Axis IV: occupational problems, other psychosocial or environmental problems and problems related to social environment Axis V: 21-30 behavior considerably influenced by delusions or hallucinations OR serious impairment in judgment, communication OR inability to function in almost all areas  Past Medical History:  Past Medical History  Diagnosis Date  . Anxiety   . History of chicken pox   . GERD (gastroesophageal reflux disease)     Past Surgical History  Procedure Laterality Date  . Wisdom tooth extraction  04/2012    Family History:  Family History  Problem Relation Age of Onset  . Hyperlipidemia Mother   . Stroke Maternal Grandfather     Social History:  reports that she has never smoked. She does not have any smokeless tobacco history on file. She reports that  drinks alcohol. She reports that she does not use illicit drugs.  Additional Social History:  Alcohol / Drug Use Pain Medications: none Prescriptions: see MAR Over the Counter: see MAR History of alcohol / drug use?: No history of alcohol / drug abuse Longest period of sobriety (when/how long): na Negative Consequences of Use:  (na) Withdrawal Symptoms:  (na)  CIWA: CIWA-Ar BP: 134/82 mmHg Pulse Rate: 96 COWS:    Allergies: No Known Allergies  Home Medications:  (Not in a hospital admission)  OB/GYN Status:  Patient's last menstrual period was 02/04/2013.  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 [employer] person: self  Risk to self Suicidal Ideation: Yes-Currently Present Suicidal Intent: Yes-Currently Present Is patient at risk for suicide?: Yes Suicidal Plan?: Yes-Currently Present Specify Current Suicidal Plan: to overdose on her medications Access to Means: Yes Specify Access to Suicidal Means: has access to her medications What has been your use of drugs/alcohol within the last 12 months?: pt denies Previous Attempts/Gestures: No How many times?: 0 Other Self Harm Risks: pt denies Triggers for Past Attempts: None known Intentional Self Injurious Behavior: None Family Suicide History: Yes (Fther committed suicide) Recent stressful life event(s): Turmoil (Comment) (Increased panic attacks, SI) Persecutory voices/beliefs?: No Depression: Yes Depression Symptoms: Despondent;Insomnia;Tearfulness;Isolating;Fatigue;Guilt;Loss of interest in usual pleasures;Feeling worthless/self pity;Feeling angry/irritable Substance abuse history and/or treatment for substance abuse?: No Suicide prevention information given to non-admitted patients: Not applicable  Risk to Others Homicidal Ideation: No Thoughts of Harm to Others: No Current Homicidal Intent: No Current Homicidal Plan: No Access to Homicidal Means: No Identified Victim: pt denies History of harm to others?: No Assessment of Violence: None Noted Violent Behavior Description: na - pt calm, cooperative Does patient have access to weapons?: No Criminal Charges Pending?: No Does patient have a court date: No  Psychosis Hallucinations: None noted Delusions: None  noted  Mental Status Report Appear/Hygiene: Other (Comment) (casual in scrubs) Eye Contact: Good Motor Activity: Restlessness Speech: Logical/coherent Level of Consciousness: Alert;Restless Mood: Anxious;Depressed Affect: Appropriate to circumstance Anxiety Level: Panic Attacks Panic attack frequency: Daily Most recent panic attack: Today Thought Processes: Coherent;Relevant Judgement: Unimpaired Orientation: Person;Place;Time;Situation Obsessive Compulsive Thoughts/Behaviors: Severe (Feels she may die or family may die)  Cognitive Functioning Concentration: Decreased Memory: Recent Intact;Remote Intact IQ: Average Insight: Poor Impulse Control: Fair Appetite: Poor Weight Loss: 12 (Since mid-April) Weight Gain: 0 Sleep: Decreased Total Hours of Sleep: 3 (wakes through night) Vegetative Symptoms: None  ADLScreening [facility]) Patient's cognitive ability adequate to safely complete daily activities?: Yes Patient able to express need for assistance with ADLs?: Yes Independently performs ADLs?: Yes (appropriate for developmental age)  Abuse/Neglect [facility]) Physical Abuse: Denies Verbal Abuse: Denies Sexual Abuse: Denies  Prior Inpatient Therapy Prior Inpatient Therapy: No Prior Therapy Dates: na Prior Therapy Facilty/Provider(s): na Reason for Treatment: na  Prior Outpatient Therapy Prior Outpatient Therapy: No Prior Therapy Dates: na Prior Therapy Facilty/Provider(s): na Reason for Treatment: na  ADL Screening (condition at time of admission) Patient's cognitive ability adequate to safely complete daily activities?: Yes Patient able to express need for assistance with ADLs?: Yes Independently performs ADLs?: Yes (appropriate for developmental age)  Home Assistive Devices/Equipment  Home Assistive Devices/Equipment: None    Abuse/Neglect Assessment (Assessment to be complete while patient is alone) Physical Abuse: Denies Verbal Abuse:  Denies Sexual Abuse: Denies Exploitation of patient/patient's resources: Denies Self-Neglect: Denies Values / Beliefs Cultural Requests During Hospitalization: None Spiritual Requests During Hospitalization: None Consults Spiritual Care Consult Needed: No Social Work Consult Needed: No Merchant navy officer (For Healthcare) Advance Directive: Patient does not have advance directive;Patient would not like information    Additional Information 1:1 In Past 12 Months?: No CIRT Risk: No Elopement Risk: No Does patient have medical clearance?: Yes     Disposition:  Disposition Initial Assessment Completed for this Encounter: Yes Disposition of Patient: Referred to;Inpatient treatment program Type of inpatient treatment program: Adult Patient referred to: Other (Comment) (Pending [facility])  On Site Evaluation by:   Reviewed with Physician:  Tressia Danas, Rennis Harding 02/08/2013 1:09 PM

## 2013-02-08 NOTE — ED Notes (Signed)
Spoke with Misty Stanley, RN in pod A- report received.

## 2013-02-08 NOTE — ED Notes (Signed)
"  My symptoms have gotten a lot worse.  I think that I am going to die 24/7.  I have negative thoughts. My heart is beating a lot faster than normal.  I get scared my body is shutting down." Reports 4 "attacks" last night."

## 2013-02-08 NOTE — ED Provider Notes (Signed)
History     CSN: [id number]  Arrival date & time 02/08/13  0815   First MD Initiated Contact with Patient 02/08/13 [phone]      Chief Complaint  Patient presents with  . Panic Attack    (Consider location/radiation/quality/duration/timing/severity/associated sxs/prior treatment) HPI  Cynthia Robertson is a 22 y.o. female complaining of exacerbation of panic disorder worsening over the course of the last week. Patient has been taking Xanax before bedtime with no relief. She has not been taking it in the day. She states that it makes her sleepy, she is not eating or drinking. She states that she feels like she is going to die and something terrible was going to happen. Patient originally denied suicidal ideation but after considerable amount of time the patient now endorses suicidal ideation without a plan of overdosing. She states that her father committed suicide she denies any prior attempts, she denies homicidal ideation, hallucinations auditory or visual, drug or alcohol abuse.   Cynthia Robertson at [employer] Past Medical History  Diagnosis Date  . Anxiety   . History of chicken pox   . GERD (gastroesophageal reflux disease)     Past Surgical History  Procedure Laterality Date  . Wisdom tooth extraction  04/2012    Family History  Problem Relation Age of Onset  . Hyperlipidemia Mother   . Stroke Maternal Grandfather     History  Substance Use Topics  . Smoking status: Never Smoker   . Smokeless tobacco: Not on file  . Alcohol Use: Yes     Comment: Occ Beer,Alcohol    OB History   Grav Para Term Preterm Abortions TAB SAB Ect Mult Living                  Review of Systems  Allergies  Review of patient's allergies indicates no known allergies.  Home Medications   Current Outpatient Rx  Name  Route  Sig  Dispense  Refill  . ALPRAZolam (XANAX) 0.5 MG tablet   Oral   Take 0.5 mg by mouth 2 (two) times daily as needed for sleep.         Marland Kitchen escitalopram (LEXAPRO)  10 MG tablet   Oral   Take 5 mg by mouth daily.         Marland Kitchen LORazepam (ATIVAN) 1 MG tablet   Oral   Take 1 mg by mouth every 8 (eight) hours.         . SPRINTEC 28 0.25-35 MG-MCG tablet   Oral   Take 1 tablet by mouth daily.         Marland Kitchen omeprazole (PRILOSEC) 20 MG capsule   Oral   Take 20 mg by mouth daily as needed (for heartburn).            BP 156/74  Pulse 89  Temp(Src) 98.1 F (36.7 C) (Oral)  Resp 20  SpO2 100%  LMP 02/04/2013  Physical Exam  Nursing note and vitals reviewed. Constitutional: She is oriented to person, place, and time. She appears well-developed and well-nourished. No distress.  HENT:  Head: Normocephalic.  Mouth/Throat: Oropharynx is clear and moist.  Eyes: Conjunctivae and EOM are normal. Pupils are equal, round, and reactive to light.  Neck: Normal range of motion.  Cardiovascular: Normal rate and intact distal pulses.   Pulmonary/Chest: Effort normal and breath sounds normal. No stridor. No respiratory distress. She has no wheezes. She has no rales. She exhibits no tenderness.  Abdominal: Soft. Bowel sounds are normal.  Musculoskeletal: Normal range of motion.  Neurological: She is alert and oriented to person, place, and time.  Psychiatric: Her mood appears anxious. She is agitated. Thought content is not paranoid and not delusional. She expresses suicidal ideation. She expresses no homicidal ideation. She expresses suicidal plans. She expresses no homicidal plans.  Tearful, agitated    ED Course  Procedures (including critical care time)  Labs Reviewed  URINALYSIS, ROUTINE W REFLEX MICROSCOPIC - Abnormal; Notable for the following:    APPearance CLOUDY (*)    Hgb urine dipstick TRACE (*)    Ketones, ur >80 (*)    All other components within normal limits  SALICYLATE LEVEL - Abnormal; Notable for the following:    Salicylate Lvl <2.0 (*)    All other components within normal limits  CBC - Abnormal; Notable for the following:     MCHC 36.2 (*)    All other components within normal limits  COMPREHENSIVE METABOLIC PANEL - Abnormal; Notable for the following:    Potassium 3.4 (*)    All other components within normal limits  URINE MICROSCOPIC-ADD ON - Abnormal; Notable for the following:    Squamous Epithelial / LPF FEW (*)    Bacteria, UA MANY (*)    All other components within normal limits  URINE CULTURE  ACETAMINOPHEN LEVEL  URINE RAPID DRUG SCREEN (HOSP PERFORMED)  POCT PREGNANCY, URINE   No results found.   Date: 02/08/2013  Rate: 70  Rhythm: normal sinus rhythm  QRS Axis: normal  Intervals: normal  ST/T Wave abnormalities: normal  Conduction Disutrbances:none  Narrative Interpretation:   Old EKG Reviewed: none available   1. Suicidal ideation   2. Panic disorder       MDM   Cynthia Robertson is a 22 y.o. female with severe anxiety and panic disorder also having suicidal ideation with a vague plan. Patient has a father who has committed suicide. Ativan given IM, sitter ordered, along with psych clearance labs.   EKG is nonischemic, blood work is unremarkable, urine drug screen is negative. Urinalysis is consistent with UTI however the patient is asymptomatic. I will not treat pending results of urine culture.  Patient is medically cleared for psychiatric evaluation.  Home medications ordered, psych holding orders placed, act team consulted.   Filed Vitals:   02/08/13 0822  BP: 156/74  Pulse: 89  Temp: 98.1 F (36.7 C)  TempSrc: Oral  Resp: 20  SpO2: 100%      Wynetta Emery, PA-C 02/08/13 1237

## 2013-02-09 LAB — URINE CULTURE

## 2013-02-09 MED ORDER — SERTRALINE HCL 25 MG PO TABS
25.0000 mg | ORAL_TABLET | Freq: Every day | ORAL | Status: DC
  Administered 2013-02-09 – 2013-02-12 (×4): 25 mg via ORAL
  Filled 2013-02-09 (×7): qty 1

## 2013-02-09 MED ORDER — PANTOPRAZOLE SODIUM 40 MG PO TBEC
40.0000 mg | DELAYED_RELEASE_TABLET | Freq: Two times a day (BID) | ORAL | Status: DC
  Administered 2013-02-10 – 2013-02-12 (×5): 40 mg via ORAL
  Filled 2013-02-09 (×13): qty 1

## 2013-02-09 MED ORDER — ENSURE COMPLETE PO LIQD
237.0000 mL | Freq: Two times a day (BID) | ORAL | Status: DC
  Administered 2013-02-09 – 2013-02-11 (×3): 237 mL via ORAL

## 2013-02-09 MED ORDER — PROPRANOLOL HCL 10 MG PO TABS
10.0000 mg | ORAL_TABLET | Freq: Four times a day (QID) | ORAL | Status: DC | PRN
  Filled 2013-02-09 (×2): qty 1

## 2013-02-09 MED ORDER — ADULT MULTIVITAMIN W/MINERALS CH
1.0000 | ORAL_TABLET | Freq: Every day | ORAL | Status: DC
  Administered 2013-02-09 – 2013-02-12 (×4): 1 via ORAL
  Filled 2013-02-09 (×7): qty 1

## 2013-02-09 MED ORDER — TRAZODONE HCL 50 MG PO TABS: 50.0000 mg | ORAL_TABLET | Freq: Every day | ORAL | Status: DC

## 2013-02-09 MED ORDER — TRAZODONE HCL 50 MG PO TABS
50.0000 mg | ORAL_TABLET | Freq: Every evening | ORAL | Status: DC | PRN
  Filled 2013-02-09: qty 1

## 2013-02-09 MED ORDER — DOXYCYCLINE HYCLATE 100 MG PO TABS
100.0000 mg | ORAL_TABLET | Freq: Two times a day (BID) | ORAL | Status: DC
  Administered 2013-02-09 – 2013-02-12 (×7): 100 mg via ORAL
  Filled 2013-02-09 (×13): qty 1

## 2013-02-09 NOTE — [facility] Group Notes (Signed)
[facility] LCSW Group Therapy        Feelings Around Relapse        1:15-2:30 PM    02/09/2013 11:57 AM  Type of Therapy:  Group Therapy  Participation Level:  Active  Participation Quality:  Appropriate and Attentive  Affect:  Anxious and Depressed  Cognitive:  Alert and Appropriate  Insight:  Developing/Improving and Engaged  Engagement in Therapy:  Developing/Improving and Engaged  Modes of Intervention: Discussion, Education, Exploration, Problem-Solving, Rapport Building, Support  Summary of Progress/Problems:  Patient advised relapsing would mean remaining in a space where anxiety and panic continues to control her life.  She shared she is tired of living in fear and wants to get better.  Wynn Banker 02/09/2013, 11:57 AM

## 2013-02-09 NOTE — [facility] Suicide Risk Assessment (Signed)
[facility] INPATIENT:  Family/Significant Other Suicide Prevention Education  Suicide Prevention Education:  Education Completed; Melvenia Needles, Mother, [phone] has been identified by the patient as the family member/significant other with whom the patient will be residing, and identified as the person(s) who will aid the patient in the event of a mental health crisis (suicidal ideations/suicide attempt).  With written consent from the patient, the family member/significant other has been provided the following suicide prevention education, prior to the and/or following the discharge of the patient.  The suicide prevention education provided includes the following:  Suicide risk factors  Suicide prevention and interventions  National Suicide Hotline telephone number  [facility] assessment telephone number  [facility] Emergency Assistance 911  [facility] and/or Residential Mobile Crisis Unit telephone number  Request made of family/significant other to:  Remove weapons (e.g., guns, rifles, knives), all items previously/currently identified as safety concern.  Mother reports there are no guns in the home.  Remove drugs/medications (over-the-counter, prescriptions, illicit drugs), all items previously/currently identified as a safety concern.  The family member/significant other verbalizes understanding of the suicide prevention education information provided.  The family member/significant other agrees to remove the items of safety concern listed above.  Wynn Banker 02/09/2013, 1:09 PM
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
## 2013-02-09 NOTE — Progress Notes (Signed)
Nutrition Brief Note  Patient identified on the Malnutrition Screening Tool (MST) Report  BMI:  30.8.   Patient meets criteria for obesity grade 1 based on current BMI.   Current diet order is regular, patient is consuming approximately poor% of meals at this time. Labs and medications reviewed.   Patient reports poor intake for the last 2 weeks secondary to increased anxiety.  Weight loss from UBW of 210 lbs to 196 lbs.  States that eating increases panic at times.   (Had a panic attack the first time after she ate.)  Current intake is poor  Encouraged increased intake of meals and snacks.  Meals per patient preference.  Snacks tid.  Will add Ensure Complete bid and MVI daily.  If further nutrition issues arise, please consult RD.   Oran Rein, RD, LDN Clinical Inpatient Dietitian Pager:  [phone] Weekend and after hours pager:  [phone]

## 2013-02-09 NOTE — Progress Notes (Signed)
Patient ID: Cynthia Robertson, female   DOB: 1990/11/01, 22 y.o.   MRN: [id number] D: pt. Lying in bed, reports anxiety. R: Writer introduced self to client, encouraged her to rest as she had previous received meds for anxiety. Staff will monitor q13min for safety. R: Pt. Is safe on the unit.
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
## 2013-02-09 NOTE — Progress Notes (Signed)
D: Patient appropriate and cooperative with staff and peers. Patient's affect/mood is anxious. Patient complained of anxiety 7/10 and a racing heart rate. She reported on the self inventory sheet that her sleep is fair, appetite is poor, energy level is normal and ability to pay attention is improving. Patient rated feelings of hopelessness "5". Patient stated, "I'm feeling a little weird out about being here". She's attending groups and compliant with medication regimen. Patient verbalized that she's really concerned about heart and would like to speak to the doctor or NP; Writer spoke to Chili, NP regarding the patient's concerns.  A: Support and encouragement provided to patient. Scheduled medications administered per MD orders. Maintain Q15 minute checks for safety.  R: Patient receptive. Denies SI/HI/AVH. Patient remains safe.

## 2013-02-09 NOTE — Progress Notes (Signed)
Adult Psychoeducational Group Note  Date:  02/09/2013 Time:  9:18 PM  Group Topic/Focus:  Goals Group:   The focus of this group is to help patients establish daily goals to achieve during treatment and discuss how the patient can incorporate goal setting into their daily lives to aide in recovery.  Participation Level:  Active  Participation Quality:  Appropriate  Affect:  Appropriate  Cognitive:  Appropriate  Insight: Appropriate  Engagement in Group:  Engaged  Modes of Intervention:  Discussion  Additional Comments:  Pt. Stated that she needs to take care of situations and problems as they come and not get so worked up over every little thing. Pt. Stated that taking time for herself and trying to stay and think positive it will help decrease the occurrences of her anxiety.  Terie Purser R 02/09/2013, 9:18 PM

## 2013-02-09 NOTE — [facility] Suicide Risk Assessment (Signed)
Suicide Risk Assessment  Admission Assessment     Nursing information obtained from:  Patient Demographic factors:  Caucasian Current Mental Status:    Loss Factors:  Loss of significant relationship Historical Factors:  Family history of suicide Risk Reduction Factors:  Sense of responsibility to family;Employed;Living with another person, especially a relative;Positive social support;Positive therapeutic relationship  CLINICAL FACTORS:   Severe Anxiety and/or Agitation  COGNITIVE FEATURES THAT CONTRIBUTE TO RISK:  Thought constriction (tunnel vision)    SUICIDE RISK:   Minimal: No identifiable suicidal ideation.  Patients presenting with no risk factors but with morbid ruminations; may be classified as minimal risk based on the severity of the depressive symptoms  PLAN OF CARE: Start medications as appropriate. Provide supportive counselling and education.  I certify that inpatient services furnished can reasonably be expected to improve the patient's condition.  Cynthia Robertson 02/09/2013, 12:24 PM

## 2013-02-09 NOTE — Progress Notes (Signed)
D.  Pt anxious on approach, complaint of anxiety and chest tightness.  Pt also wanted to have what appeared to be a bug bite just below her chest looked at.  Pt is on doxycycline for this but feels that it is getting worse and would like the doctor to see it tomorrow.  Pt became nauseated and vomited about an hour after taking scheduled antibiotic.  Pt did admit that she has hardly eaten anything today.  Denies SI/HI/hallucinations at this time.  Positive for evening group, interacting appropriately on unit.  A.  Encouraged Pt to eat before next taking antibiotic.  Gave Pt a printout on newly ordered medication, because Pt was afraid to take it.  Support and encouragement offered  R.  Pt did not wish to take newly ordered Inderal.  She is currently in bed resting, no acute distress noted.  Will continue to monitor.

## 2013-02-09 NOTE — Assessment & Plan Note (Addendum)
Recommended that she try SSRI again and give it a little more time. Can use xanax sparingly as needed for panic attacks.  Will also refer to therapist.   Lexapro 10mg - I instructed pt to start 1/2 tablet once daily for 1 week and then increase to a full tablet once daily on week two as tolerated.  We discussed common side effects such as nausea, drowsiness and weight gain.  Also discussed rare but serious side effect of suicide ideation.  She is instructed to discontinue medication go directly to ED if this occurs.  Pt verbalizes understanding.  Plan follow up in 1 month to evaluate progress.    25 minutes spent with pt today.  >50% of the time was spent counseling pt on her anxiety.

## 2013-02-09 NOTE — [facility] Group Notes (Signed)
[facility] LCSW Aftercare Discharge Planning Group Note   02/09/2013 11:54 AM  Participation Quality:  Appropriate  Mood/Affect:  Anxious, Appropriate and Depressed  Depression Rating: 1  Anxiety Rating:  6-7  Thoughts of Suicide:  No  Will you contract for safety?   NA  Current AVH:  Yes  Plan for Discharge/Comments:  Patient reports admitting to hospital due to severe panic attacks and fear that she will die as a result.  She advised that she does not have outpatient providers and would be okay with a referral in [place].  Patient has home, transportation and access to medications.  Transportation Means: Patient has transportation.  Supports:  Patient has a good support system.   Cynthia Robertson, Joesph July

## 2013-02-09 NOTE — Progress Notes (Signed)
Adult Psychoeducational Group Note  Date:  02/09/2013 Time:  11:16 AM  Group Topic/Focus:  Wellness Toolbox:   The focus of this group is to discuss various aspects of wellness, balancing those aspects and exploring ways to increase the ability to experience wellness.  Patients will create a wellness toolbox for use upon discharge.  Participation Level:  Active  Participation Quality:  Appropriate  Affect:  Appropriate  Cognitive:  Alert and Oriented  Insight: Good  Engagement in Group:  Engaged  Modes of Intervention:  Activity and Support  Additional Comments:  Pt stated her goal is to control her anxiety.The activity today helped her to minimize the anxiety.  Bernetta Sutley T 02/09/2013, 11:16 AM

## 2013-02-10 ENCOUNTER — Inpatient Hospital Stay (HOSPITAL_COMMUNITY): Payer: 59

## 2013-02-10 ENCOUNTER — Encounter (HOSPITAL_COMMUNITY): Payer: Self-pay | Admitting: *Deleted

## 2013-02-10 DIAGNOSIS — F411 Generalized anxiety disorder: Principal | ICD-10-CM

## 2013-02-10 DIAGNOSIS — F329 Major depressive disorder, single episode, unspecified: Secondary | ICD-10-CM

## 2013-02-10 LAB — BASIC METABOLIC PANEL
BUN: 5 mg/dL — ABNORMAL LOW (ref 6–23)
CO2: 20 mEq/L (ref 19–32)
Calcium: 9.7 mg/dL (ref 8.4–10.5)
Creatinine, Ser: 0.9 mg/dL (ref 0.50–1.10)
Glucose, Bld: 125 mg/dL — ABNORMAL HIGH (ref 70–99)

## 2013-02-10 LAB — CBC
HCT: 37.9 % (ref 36.0–46.0)
Hemoglobin: 13.7 g/dL (ref 12.0–15.0)
MCH: 32.4 pg (ref 26.0–34.0)
MCV: 89.6 fL (ref 78.0–100.0)
RBC: 4.23 MIL/uL (ref 3.87–5.11)

## 2013-02-10 NOTE — Progress Notes (Signed)
[facility] Group Notes:  (Nursing/MHT/Case Management/Adjunct)  Date:  02/10/2013  Time:  10:14 PM  Type of Therapy:  Psychoeducational Skills  Participation Level:  Active  Participation Quality:  Appropriate  Affect:  Appropriate  Cognitive:  Appropriate  Insight:  Appropriate  Engagement in Group:  Improving  Modes of Intervention:  Discussion and Support  Summary of Progress/Problems:Very good day most concerned about her health but she knows her health is good  Cynthia Robertson 02/10/2013, 10:14 PM

## 2013-02-10 NOTE — Progress Notes (Addendum)
Pt's HR was 139 and 150 on dinamap so charge RN took manuel pulse and it was in the 80s but was irregular.  Pt refused to take ordered Inderal again because she stated she did not want to take something that was a heart medication if there is nothing wrong with her heart.  Attempted to explain about use for anxiety but Pt was steadfast in her decision.  She wishes to speak to doctor today as well as her aunt who is an Charity fundraiser before taking anything else.  Will continue to monitor.  Charge nurse then took Apical pulse and it was 94.  Pt should not have pulse taken on dinomap in order for it to be accurate

## 2013-02-10 NOTE — Progress Notes (Signed)
D.  Pt remains anxious and states she really can't eat or drink much due to this.  She states she has felt better today though since going to the emergency room and discovering that her heart is healthy "as a horse".  She waited until 2200 to take her doxycycline because she was afraid that she would become sick again from the medication.  Denies SI/HI/hallucinations at this time.  Interacting appropriately within the milieu and positive for group.  A.  Gave Pt crackers and ginger ale with her doxycycline.  Support and encouragement offered  R.  Pt resting in bed in no distress at this time, respirations unlabored.  Will continue to monitor.

## 2013-02-10 NOTE — ED Provider Notes (Signed)
History     CSN: [id number]  Arrival date & time 02/10/13  1504   First MD Initiated Contact with Patient 02/10/13 1623      Chief Complaint  Patient presents with  . Chest Pain  . sent from [facility] for eval     (Consider location/radiation/quality/duration/timing/severity/associated sxs/prior treatment) The history is provided by the patient.  Cynthia Robertson is a 22 y.o. female history of anxiety, reflux here presenting with shortness of breath and chest pain. She has been having palpitations and shortness of breath for the last several days. She has worsening shortness of breath when she walks and when she takes a deep breath. She was recently put back on oral contraceptives. Denies any history of blood clots. She was seen here 2 days ago and was sent to behavioral health for suicidal ideations. Sent in from behavior health for continual symptoms and possible decreased breath sounds. Denies fevers or chills or cough.   Past Medical History  Diagnosis Date  . Anxiety   . History of chicken pox   . GERD (gastroesophageal reflux disease)     Past Surgical History  Procedure Laterality Date  . Wisdom tooth extraction  04/2012    Family History  Problem Relation Age of Onset  . Hyperlipidemia Mother   . Stroke Maternal Grandfather     History  Substance Use Topics  . Smoking status: Never Smoker   . Smokeless tobacco: Not on file  . Alcohol Use: Yes     Comment: Occ Beer,Alcohol    OB History   Grav Para Term Preterm Abortions TAB SAB Ect Mult Living                  Review of Systems  Respiratory: Positive for shortness of breath.   Cardiovascular: Positive for chest pain.  All other systems reviewed and are negative.    Allergies  Review of patient's allergies indicates no known allergies.  Home Medications   Current Outpatient Rx  Name  Route  Sig  Dispense  Refill  . ALPRAZolam (XANAX) 0.5 MG tablet   Oral   Take 0.5 mg by mouth 2 (two) times daily as  needed for sleep.         Marland Kitchen escitalopram (LEXAPRO) 10 MG tablet   Oral   Take 5 mg by mouth daily.         Marland Kitchen LORazepam (ATIVAN) 1 MG tablet   Oral   Take 1 mg by mouth every 8 (eight) hours as needed for anxiety.          Marland Kitchen omeprazole (PRILOSEC) 20 MG capsule   Oral   Take 20 mg by mouth daily as needed (for heartburn).          . SPRINTEC 28 0.25-35 MG-MCG tablet   Oral   Take 1 tablet by mouth daily.           BP 118/71  Pulse 102  Temp(Src) 97.5 F (36.4 C) (Oral)  Resp 22  Ht 5\' 7"  (1.702 m)  SpO2 100%  LMP 02/04/2013  Physical Exam  Nursing note and vitals reviewed. Constitutional: She is oriented to person, place, and time. She appears well-developed and well-nourished.  Slightly anxious   HENT:  Head: Normocephalic.  Mouth/Throat: Oropharynx is clear and moist.  Eyes: Conjunctivae are normal. Pupils are equal, round, and reactive to light.  Neck: Normal range of motion. Neck supple.  Cardiovascular: Regular rhythm and normal heart sounds.   Slightly tachy  Pulmonary/Chest: Effort normal and breath sounds normal. No respiratory distress. She has no wheezes. She has no rales. She exhibits no tenderness.  Abdominal: Soft. Bowel sounds are normal. She exhibits no distension. There is no tenderness. There is no rebound and no guarding.  Musculoskeletal: Normal range of motion. She exhibits no edema.  Neurological: She is alert and oriented to person, place, and time.  Skin: Skin is warm and dry.  Small vesicle on central chest, no surrounding erythema.   Psychiatric: She has a normal mood and affect. Her behavior is normal. Judgment and thought content normal.    ED Course  Procedures (including critical care time)  Labs Reviewed  CBC - Abnormal; Notable for the following:    MCHC 36.1 (*)    All other components within normal limits  BASIC METABOLIC PANEL  D-DIMER, QUANTITATIVE  POCT I-STAT TROPONIN I   Dg Chest 2 View  02/10/2013  *RADIOLOGY  REPORT*  Clinical Data: Chest pain with tightness and shortness of breath for 2 days.  CHEST - 2 VIEW  Comparison: 01/24/2013 and 12/09/2012.  Findings: The heart size and mediastinal contours are normal. The lungs are clear. There is no pleural effusion or pneumothorax. No acute osseous findings are identified.  IMPRESSION: Stable examination.  No active cardiopulmonary process.   Original Report Authenticated By: Carey Bullocks, M.D.      1. Generalized anxiety disorder     Date: 02/10/2013  Rate: 104  Rhythm: sinus tachycardia  QRS Axis: normal  Intervals: normal  ST/T Wave abnormalities: nonspecific ST changes  Conduction Disutrbances:none  Narrative Interpretation:   Old EKG Reviewed: unchanged     MDM  Cynthia Robertson is a 21 y.o. female here with chest pain, SOB. EKG unchanged, trop neg x 1 and symptoms going on for days. D-dimer neg. I think the tachycardia is likely from anxiety. I recommend putting her back on xanax or increase vistaril.          Richardean Canal, MD 02/10/13 [phone]

## 2013-02-10 NOTE — Progress Notes (Addendum)
D Pt presents to this nurse stating " they didn't listen to me yesterday.Marland KitchenMarland KitchenI know what anxiey feels like and this is not anxiety...the patient states her heart is " racing" at rest, that when it was taken this AM on dinemapp machine, it registered " over 159" . HR checked manually using apical HR = 104. Pt says " would you look at this bump on my abdomen, it's been there over 2 months and I'm really worried about it? NP made aware and has ordered pt to go to [facility] ED  For cxray and then assessment of postule.   A Verbal report called to ED charge and then transportation arranged with security per protocol.    R Pt refuses PO vistaril after NP suggested she take to help calm her down.

## 2013-02-10 NOTE — Progress Notes (Signed)
[facility] MD Progress Note  02/10/2013 1:42 PM Cynthia Robertson  MRN:  [id number] Subjective:  Slept fair--hard to sleep with 15 min checks, appetite fair, denies depression, 4/10 anxiety, concerned about her shortness of breath and pain in the chest --generalized all over chest cavity--sending for chest xray and evaluation, denies suicidal ideations, one panic attack since yesterday, no vistaril today for anxiety Diagnosis:   Axis I: Depressive Disorder NOS and Generalized Anxiety Disorder Axis II: Deferred Axis III:  Past Medical History  Diagnosis Date  . Anxiety   . History of chicken pox   . GERD (gastroesophageal reflux disease)    Axis IV: other psychosocial or environmental problems, problems related to social environment and problems with primary support group Axis V: 41-50 serious symptoms  ADL's:  Intact  Sleep: Fair  Appetite:  Fair  Suicidal Ideation:  None Homicidal Ideation:  None  Psychiatric Specialty Exam: Review of Systems  Constitutional: Negative.   HENT: Negative.   Eyes: Negative.   Respiratory: Negative.   Cardiovascular: Negative.   Gastrointestinal: Negative.   Genitourinary: Negative.   Musculoskeletal: Negative.   Skin: Negative.   Neurological: Negative.   Endo/Heme/Allergies: Negative.   Psychiatric/Behavioral: Positive for depression. The patient is nervous/anxious.     Blood pressure 112/79, pulse 150, temperature 98.1 F (36.7 C), temperature source Oral, resp. rate 16, height 5\' 7"  (1.702 m), last menstrual period 02/04/2013, SpO2 99.00%.There is no weight on file to calculate BMI.  General Appearance: Casual  Eye Contact::  Good  Speech:  Normal Rate  Volume:  Normal  Mood:  Anxious and Depressed  Affect:  Congruent  Thought Process:  Coherent  Orientation:  Full (Time, Place, and Person)  Thought Content:  WDL  Suicidal Thoughts:  No  Homicidal Thoughts:  No  Memory:  Immediate;   Fair Recent;   Fair Remote;   Fair  Judgement:   Fair  Insight:  Fair  Psychomotor Activity:  Decreased  Concentration:  Fair  Recall:  Fair  Akathisia:  No  Handed:  Right  AIMS (if indicated):     Assets:  Communication Skills Desire for Improvement Physical Health Resilience Social Support  Sleep:  Number of Hours: 6   Current Medications: Current Facility-Administered Medications  Medication Dose Route Frequency Provider Last Rate Last Dose  . acetaminophen (TYLENOL) tablet 650 mg  650 mg Oral Q6H PRN Larena Sox, MD      . doxycycline (VIBRA-TABS) tablet 100 mg  100 mg Oral Q12H Nanine Means, NP   100 mg at 02/10/13 0845  . feeding supplement (ENSURE COMPLETE) liquid 237 mL  237 mL Oral BID BM Jeoffrey Massed, RD   237 mL at 02/09/13 1551  . hydrOXYzine (ATARAX/VISTARIL) tablet 25 mg  25 mg Oral Q4H PRN Larena Sox, MD   25 mg at 02/09/13 2234  . ibuprofen (ADVIL,MOTRIN) tablet 600 mg  600 mg Oral TID Larena Sox, MD   600 mg at 02/10/13 1207  . multivitamin with minerals tablet 1 tablet  1 tablet Oral Daily Jeoffrey Massed, RD   1 tablet at 02/10/13 0845  . norethindrone-ethinyl estradiol (OVCON-35,BALZIVA,BRIELLYN) tablet 1 tablet  1 tablet Oral Daily Himabindu Ravi, MD   1 tablet at 02/10/13 0800  . pantoprazole (PROTONIX) EC tablet 40 mg  40 mg Oral BID Nanine Means, NP   40 mg at 02/10/13 0845  . propranolol (INDERAL) tablet 10 mg  10 mg Oral QID PRN Court Joy, PA-C      .  sertraline (ZOLOFT) tablet 25 mg  25 mg Oral Daily Nanine Means, NP   25 mg at 02/10/13 0845  . traZODone (DESYREL) tablet 50 mg  50 mg Oral QHS PRN Nanine Means, NP        Lab Results: No results found for this or any previous visit (from the past 48 hour(s)).  Physical Findings: AIMS: Facial and Oral Movements Muscles of Facial Expression: None, normal Lips and Perioral Area: None, normal Jaw: None, normal Tongue: None, normal,Extremity Movements Upper (arms, wrists, hands, fingers): None, normal Lower (legs, knees, ankles,  toes): None, normal, Trunk Movements Neck, shoulders, hips: None, normal, Overall Severity Severity of abnormal movements (highest score from questions above): None, normal Incapacitation due to abnormal movements: None, normal Patient's awareness of abnormal movements (rate only patient's report): No Awareness, Dental Status Current problems with teeth and/or dentures?: No Does patient usually wear dentures?: No  CIWA:    COWS:     Treatment Plan Summary: Daily contact with patient to assess and evaluate symptoms and progress in treatment Medication management  Plan:  Review of chart, vital signs, medications, and notes. 1-Individual and group therapy 2-Medication management for depression and anxiety:  Medications reviewed with the patient and she stated no untoward effects, no changes made 3-Coping skills for depression and anxiety  4-Continue crisis stabilization and management 5-Address health issues--monitoring vital signs, stable 6-Treatment plan in progress to prevent relapse of depression and anxiety  Medical Decision Making Problem Points:  Established problem, stable/improving (1) and Review of psycho-social stressors (1) Data Points:  Review of medication regiment & side effects (2)  I certify that inpatient services furnished can reasonably be expected to improve the patient's condition.   Nanine Means, PMH-NP 02/10/2013, 1:42 PM

## 2013-02-10 NOTE — ED Notes (Addendum)
Pt reports she is being seen and treated at [facility] for anxiety. Reports cental chest pain 7/10 since last night. Reports SOB, states this does not feel like the shortness of breath she gets from her attacks.  [facility] reported pts lung sounds were not bil and equal.

## 2013-02-11 MED ORDER — POTASSIUM CHLORIDE CRYS ER 10 MEQ PO TBCR
10.0000 meq | EXTENDED_RELEASE_TABLET | Freq: Once | ORAL | Status: AC
  Administered 2013-02-11: 10 meq via ORAL
  Filled 2013-02-11: qty 1

## 2013-02-11 MED ORDER — PROPRANOLOL HCL 10 MG PO TABS
10.0000 mg | ORAL_TABLET | Freq: Two times a day (BID) | ORAL | Status: DC
  Administered 2013-02-11 – 2013-02-12 (×2): 10 mg via ORAL
  Filled 2013-02-11 (×8): qty 1

## 2013-02-11 NOTE — Progress Notes (Signed)
[name] is having a better day today. She reports she rested " much better" last night. She is more calm as evidenced by her ability to stand still and carry on conversation, without moving from side to side.    A SHe denies SI within the past 24 hrs, she rates her depression and hopelessness " 0 / 1 " and states her DC plan is to " take meds when needed and to stop focusing on my anxiety".   R Safety is in place and DC plan  Already arraanced per case manager.

## 2013-02-11 NOTE — Progress Notes (Signed)
Reviewed the information documented and agree with the treatment plan.  Cynthia Robertson,JANARDHAHA R. 02/11/2013 1:30 PM 

## 2013-02-11 NOTE — Progress Notes (Signed)
Patient ID: Cynthia Robertson, female   DOB: 09-29-1991, 22 y.o.   MRN: [id number] [facility] MD Progress Note  02/11/2013 5:17 PM Cynthia Robertson  MRN:  [id number] Subjective: "I'm Good!" patient states. Objective:  Patient is up and active on unit milieu. States last night was the first night she has slept since being here. Mood is euthymic, denies depression and notes that anxiety is her biggest problem and today it is a 3/10. Review of chart shows chronic tachycardia since arrival, and mild hypokalemia. EKG is reviewed and shows NS tachy.  Diagnosis:   Axis I: Depressive Disorder NOS and Generalized Anxiety Disorder Axis II: Deferred Axis III:  Past Medical History  Diagnosis Date  . Anxiety   . History of chicken pox   . GERD (gastroesophageal reflux disease)    Axis IV: other psychosocial or environmental problems, problems related to social environment and problems with primary support group Axis V: 41-50 serious symptoms  ADL's:  Intact  Sleep: good  Appetite:  Fair  Suicidal Ideation:  None Homicidal Ideation:  None  Psychiatric Specialty Exam: Review of Systems  Constitutional: Negative.  Negative for fever, chills, weight loss, malaise/fatigue and diaphoresis.  HENT: Negative.  Negative for congestion and sore throat.   Eyes: Negative.  Negative for blurred vision, double vision and photophobia.  Respiratory: Negative.  Negative for cough, shortness of breath and wheezing.   Cardiovascular: Negative.  Negative for chest pain, palpitations and PND.  Gastrointestinal: Negative.  Negative for heartburn, nausea, vomiting, abdominal pain, diarrhea and constipation.  Genitourinary: Negative.   Musculoskeletal: Negative.  Negative for myalgias, joint pain and falls.  Skin: Negative.   Neurological: Negative.  Negative for dizziness, tingling, tremors, sensory change, speech change, focal weakness, seizures, loss of consciousness, weakness and headaches.  Endo/Heme/Allergies:  Negative.  Negative for polydipsia. Does not bruise/bleed easily.  Psychiatric/Behavioral: Positive for depression. Negative for suicidal ideas, hallucinations, memory loss and substance abuse. The patient is nervous/anxious. The patient does not have insomnia.     Blood pressure 124/79, pulse 142, temperature 98.1 F (36.7 C), temperature source Oral, resp. rate 16, height 5\' 7"  (1.702 m), last menstrual period 02/04/2013, SpO2 100.00%.There is no weight on file to calculate BMI.  General Appearance: Casual  Eye Contact::  Good  Speech:  Normal Rate  Volume:  Normal  Mood:  Anxious denies depression  Affect:  Congruent  Thought Process:  Coherent  Orientation:  Full (Time, Place, and Person)  Thought Content:  WDL  Suicidal Thoughts:  No  Homicidal Thoughts:  No  Memory:  Immediate;   Fair Recent;   Fair Remote;   Fair  Judgement:  Fair  Insight:  Fair  Psychomotor Activity:  Decreased  Concentration:  Fair  Recall:  Fair  Akathisia:  No  Handed:  Right  AIMS (if indicated):     Assets:  Communication Skills Desire for Improvement Physical Health Resilience Social Support  Sleep:  Number of Hours: 5.5   Current Medications: Current Facility-Administered Medications  Medication Dose Route Frequency Provider Last Rate Last Dose  . acetaminophen (TYLENOL) tablet 650 mg  650 mg Oral Q6H PRN Larena Sox, MD      . doxycycline (VIBRA-TABS) tablet 100 mg  100 mg Oral Q12H Nanine Means, NP   100 mg at 02/11/13 0810  . feeding supplement (ENSURE COMPLETE) liquid 237 mL  237 mL Oral BID BM Gwyneth Sprout, MD   237 mL at 02/11/13 1250  . hydrOXYzine (ATARAX/VISTARIL) tablet 25  mg  25 mg Oral Q4H PRN Larena Sox, MD   25 mg at 02/11/13 1303  . ibuprofen (ADVIL,MOTRIN) tablet 600 mg  600 mg Oral TID Larena Sox, MD   600 mg at 02/11/13 1703  . multivitamin with minerals tablet 1 tablet  1 tablet Oral Daily Gwyneth Sprout, MD   1 tablet at 02/11/13 0810  .  norethindrone-ethinyl estradiol (OVCON-35,BALZIVA,BRIELLYN) tablet 1 tablet  1 tablet Oral Daily Himabindu Ravi, MD   1 tablet at 02/11/13 0800  . pantoprazole (PROTONIX) EC tablet 40 mg  40 mg Oral BID Nanine Means, NP   40 mg at 02/11/13 1703  . propranolol (INDERAL) tablet 10 mg  10 mg Oral QID PRN Court Joy, PA-C      . sertraline (ZOLOFT) tablet 25 mg  25 mg Oral Daily Nanine Means, NP   25 mg at 02/11/13 0810  . traZODone (DESYREL) tablet 50 mg  50 mg Oral QHS PRN Nanine Means, NP        Lab Results:  Results for orders placed during the hospital encounter of 02/08/13 (from the past 48 hour(s))  CBC     Status: Abnormal   Collection Time    02/10/13  3:25 PM      Result Value Range   WBC 8.3  4.0 - 10.5 K/uL   RBC 4.23  3.87 - 5.11 MIL/uL   Hemoglobin 13.7  12.0 - 15.0 g/dL   HCT 16.1  09.6 - 04.5 %   MCV 89.6  78.0 - 100.0 fL   MCH 32.4  26.0 - 34.0 pg   MCHC 36.1 (*) 30.0 - 36.0 g/dL   RDW 40.9  81.1 - 91.4 %   Platelets 279  150 - 400 K/uL  BASIC METABOLIC PANEL     Status: Abnormal   Collection Time    02/10/13  3:25 PM      Result Value Range   Sodium 137  135 - 145 mEq/L   Potassium 3.2 (*) 3.5 - 5.1 mEq/L   Chloride 104  96 - 112 mEq/L   CO2 20  19 - 32 mEq/L   Glucose, Bld 125 (*) 70 - 99 mg/dL   BUN 5 (*) 6 - 23 mg/dL   Creatinine, Ser 7.82  0.50 - 1.10 mg/dL   Calcium 9.7  8.4 - 95.6 mg/dL   GFR calc non Af Amer 89 (*) >90 mL/min   GFR calc Af Amer >90  >90 mL/min   Comment:            The eGFR has been calculated     using the CKD EPI equation.     This calculation has not been     validated in all clinical     situations.     eGFR's persistently     <90 mL/min signify     possible Chronic Kidney Disease.  D-DIMER, QUANTITATIVE     Status: None   Collection Time    02/10/13  3:25 PM      Result Value Range   D-Dimer, Quant 0.30  0.00 - 0.48 ug/mL-FEU   Comment:            AT THE INHOUSE ESTABLISHED CUTOFF     VALUE OF 0.48 ug/mL FEU,      THIS ASSAY HAS BEEN DOCUMENTED     IN THE LITERATURE TO HAVE     A SENSITIVITY AND NEGATIVE     PREDICTIVE VALUE OF AT LEAST  98 TO 99%.  THE TEST RESULT     SHOULD BE CORRELATED WITH     AN ASSESSMENT OF THE CLINICAL     PROBABILITY OF DVT / VTE.  POCT I-STAT TROPONIN I     Status: None   Collection Time    02/10/13  3:30 PM      Result Value Range   Troponin i, poc 0.00  0.00 - 0.08 ng/mL   Comment 3            Comment: Due to the release kinetics of cTnI,     a negative result within the first hours     of the onset of symptoms does not rule out     myocardial infarction with certainty.     If myocardial infarction is still suspected,     repeat the test at appropriate intervals.    Physical Findings: AIMS: Facial and Oral Movements Muscles of Facial Expression: None, normal Lips and Perioral Area: None, normal Jaw: None, normal Tongue: None, normal,Extremity Movements Upper (arms, wrists, hands, fingers): None, normal Lower (legs, knees, ankles, toes): None, normal, Trunk Movements Neck, shoulders, hips: None, normal, Overall Severity Severity of abnormal movements (highest score from questions above): None, normal Incapacitation due to abnormal movements: None, normal Patient's awareness of abnormal movements (rate only patient's report): No Awareness, Dental Status Current problems with teeth and/or dentures?: No Does patient usually wear dentures?: No  CIWA:    COWS:     Treatment Plan Summary: Daily contact with patient to assess and evaluate symptoms and progress in treatment Medication management  Plan:  Review of chart, vital signs, medications, and notes. 1-Individual and group therapy 2-Medication management for depression and anxiety:  Medications reviewed with the patient and she stated no untoward effects, no changes made 3-Coping skills for depression and anxiety  4-Continue crisis stabilization and management 5-Address health issues--monitoring  vital signs, stable 6-Treatment plan in progress to prevent relapse of depression and anxiety 7. EKG, CXR, Labs, D-dimer reviewed. 8. Will recommend Inderal 10mg  po BID vs. Prn as the patient is not taking it, and continues to be tachycardic. 9. Will also treat hypokalemia with Kdur. 10. Did discuss with IM MD Dr. Cletus Gash who recommended TSH so will order this as well. Medical Decision Making Problem Points:  Established problem, stable/improving (1), New problem, with additional work-up planned (4), New problem, with no additional work-up planned (3) and Review of psycho-social stressors (1) Data Points:  Review of medication regiment & side effects (2)  I certify that inpatient services furnished can reasonably be expected to improve the patient's condition.   Rona Ravens. Kathie Posa RPAC 5:24 PM 02/11/2013

## 2013-02-11 NOTE — Progress Notes (Signed)
Adult Psychoeducational Group Note  Date:  02/11/2013 Time:  6:26 PM  Group Topic/Focus:  Conflict Resolution:   The focus of this group is to discuss the conflict resolution process and how it may be used upon discharge.  Participation Level:  Active  Participation Quality:  Appropriate  Affect:  Appropriate  Cognitive:  Appropriate  Insight: Good  Engagement in Group:  Engaged  Modes of Intervention:  Problem-solving  Additional Comments:  Patient stated that her definition of a conflict was a "struggle or argument with yourself or someone else." Patient stated was able to effectively identify and evaluate her current conflict aloud in group.   Lyndee Hensen 02/11/2013, 6:26 PM

## 2013-02-11 NOTE — [facility] Group Notes (Signed)
[facility] Group Notes:  (Clinical Social Work)  02/11/2013   3:00-4:00PM  Summary of Progress/Problems:   The main focus of today's process group was to   identify the patient's current support system and decide on other supports that can be put in place.  Four definitions/levels of support were discussed and an exercise was utilized to show how much stronger we become with additional supports.  An emphasis was placed on using counselor, doctor, therapy groups, 12-step groups, and problem-specific support groups to expand supports, as well as doing something different than has been done before. The patient expressed a willingness to ADD a therapist downstairs at [facility], which has already been set up.  She also wants to go to a psychiatrist, and wants to get back into church.  Finally, an uncle from whom she has been estranged has come to visit her in the hospital, and she wants to rekindle that relationship and give/get support.  Type of Therapy:  Process Group  Participation Level:  Active  Participation Quality:  Appropriate, Attentive and Sharing  Affect:  Blunted  Cognitive:  Alert, Appropriate and Oriented  Insight:  Engaged  Engagement in Therapy:  Engaged  Modes of Intervention:  Education,  Support and [employer], LCSW 02/11/2013, 5:11 PM

## 2013-02-12 DIAGNOSIS — R000 Tachycardia, unspecified: Secondary | ICD-10-CM | POA: Clinically undetermined

## 2013-02-12 MED ORDER — TRAZODONE HCL 50 MG PO TABS: 50.0000 mg | ORAL_TABLET | Freq: Every evening | ORAL | Status: DC | PRN

## 2013-02-12 MED ORDER — SERTRALINE HCL 25 MG PO TABS: 25.0000 mg | ORAL_TABLET | Freq: Every day | ORAL | Status: DC

## 2013-02-12 MED ORDER — DOXYCYCLINE HYCLATE 100 MG PO TABS: 100.0000 mg | ORAL_TABLET | Freq: Two times a day (BID) | ORAL | Status: AC

## 2013-02-12 MED ORDER — PROPRANOLOL HCL 10 MG PO TABS: 10.0000 mg | ORAL_TABLET | Freq: Two times a day (BID) | ORAL | Status: DC

## 2013-02-12 MED ORDER — HYDROXYZINE HCL 25 MG PO TABS: 25.0000 mg | ORAL_TABLET | ORAL | Status: DC | PRN

## 2013-02-12 MED ORDER — NORETHINDRONE-ETH ESTRADIOL 0.4-35 MG-MCG PO TABS: 1.0000 | ORAL_TABLET | Freq: Every day | ORAL | Status: DC

## 2013-02-12 NOTE — Discharge Summary (Signed)
Physician Discharge Summary Note  Patient:  Cynthia Robertson is an 22 y.o., female MRN:  [id number] DOB:  Sep 09, 1991 Patient phone:  [phone] (home)  Patient address:   [address] [place] [address],   Date of Admission:  02/08/2013 Date of Discharge: 02/12/2013  Reason for Admission:  Suicidal, panic attacks  Discharge Diagnoses: Principal Problem:   Generalized anxiety disorder Active Problems:   Sinus tachycardia  Review of Systems  Constitutional: Negative.   HENT: Negative.   Eyes: Negative.   Respiratory: Negative.   Cardiovascular: Negative.   Gastrointestinal: Negative.   Genitourinary: Negative.   Musculoskeletal: Negative.   Skin: Negative.   Neurological: Negative.   Endo/Heme/Allergies: Negative.   Psychiatric/Behavioral: The patient is nervous/anxious.    Axis Diagnosis:   AXIS I:  Depressive Disorder NOS, Generalized Anxiety Disorder and Panic Disorder AXIS II:  Deferred AXIS III:   Past Medical History  Diagnosis Date  . Anxiety   . History of chicken pox   . GERD (gastroesophageal reflux disease)    AXIS IV:  other psychosocial or environmental problems, problems related to social environment and problems with primary support group AXIS V:  61-70 mild symptoms  Level of Care:  OP  Hospital Course:  On admission:  Cynthia Robertson presented with anxiety increased over the past two weeks, 4-8 panic attacks daily, ativan prescribed and she only took one at night, continued to increase, appetite decreased. Cynthia Robertson then went to [facility] and the provider placed her on Lexapro 10 mg and Xanax. The Xanax helped her to get to sleep but caused her to have panic attacks at night. Wednesday night her anxiety got worse and she became suicidal with the panic.  During hospitalization:  Medication managed--Prior to admission, Ativan 1 mg q8H PRN anxiety ordered on 4/28 and Xanax 0.5 mg BID and Lexapro 10 mg daily ordered on 5/1 for her anxiety and depression.  These were  discontinued during inpatient (had only received one dose of Lexapro) and atarax 25 mg q4H PRN anxiety and Zoloft 25 mg daily for depression ordered.  Due to her steady rise in heart rate, propranolol 10 mg BID ordered for tachycardia and anxiety, Trazodone 50 mg at bedtime for sleep PRN.  Doxycline 100 mg BID started for an infected nodule on her midchest.  Cynthia Robertson was very concerned that something was wrong with her physically and fixated on this point, reinforced when she started have chest discomfort when a nurse thought it might be a blood clot due to her birth control---no risk factors noted--non-smoker, no history,etc.  This Clinical research associate tried to convince her this was not the case  but on Saturday when another nurse listened to her breath sounds--she thought there was some inequalities bilaterally and with the carotid pulse--this practitioner assessed her and did not find congruent findings but due to the patient's fixation on her health--she was sent to the ED for a chest x-ray and further evaluation--all results were negative.  However, her pulse rate steadily increased with no resolution and the propranolol started.  Once she returned from the ED, she had a vast improvement.  Breathing exercises were practiced with this client to assist during her anxious states.  She did participate in group therapy and will continue her care at [facility] outpatient services.  Patient denied suicidal/homicidal ideations and auditory/visual hallucinations, follow-up appointments encouraged to attend, Rx given.  Cynthia Robertson is mentally and physically stable for discharge.  Consults:  None  Significant Diagnostic Studies:  labs: Completed and reviewed,  stable  Discharge Vitals:   Blood pressure 124/83, pulse 60, temperature 98.7 F (37.1 C), temperature source Oral, resp. rate 16, height 5\' 7"  (1.702 m), last menstrual period 02/04/2013, SpO2 100.00%. There is no weight on file to calculate BMI. Lab Results:   Results for  orders placed during the hospital encounter of 02/08/13 (from the past 72 hour(s))  CBC     Status: Abnormal   Collection Time    02/10/13  3:25 PM      Result Value Range   WBC 8.3  4.0 - 10.5 K/uL   RBC 4.23  3.87 - 5.11 MIL/uL   Hemoglobin 13.7  12.0 - 15.0 g/dL   HCT 30.8  65.7 - 84.6 %   MCV 89.6  78.0 - 100.0 fL   MCH 32.4  26.0 - 34.0 pg   MCHC 36.1 (*) 30.0 - 36.0 g/dL   RDW 96.2  95.2 - 84.1 %   Platelets 279  150 - 400 K/uL  BASIC METABOLIC PANEL     Status: Abnormal   Collection Time    02/10/13  3:25 PM      Result Value Range   Sodium 137  135 - 145 mEq/L   Potassium 3.2 (*) 3.5 - 5.1 mEq/L   Chloride 104  96 - 112 mEq/L   CO2 20  19 - 32 mEq/L   Glucose, Bld 125 (*) 70 - 99 mg/dL   BUN 5 (*) 6 - 23 mg/dL   Creatinine, Ser 3.24  0.50 - 1.10 mg/dL   Calcium 9.7  8.4 - 40.1 mg/dL   GFR calc non Af Amer 89 (*) >90 mL/min   GFR calc Af Amer >90  >90 mL/min   Comment:            The eGFR has been calculated     using the CKD EPI equation.     This calculation has not been     validated in all clinical     situations.     eGFR's persistently     <90 mL/min signify     possible Chronic Kidney Disease.  D-DIMER, QUANTITATIVE     Status: None   Collection Time    02/10/13  3:25 PM      Result Value Range   D-Dimer, Quant 0.30  0.00 - 0.48 ug/mL-FEU   Comment:            AT THE INHOUSE ESTABLISHED CUTOFF     VALUE OF 0.48 ug/mL FEU,     THIS ASSAY HAS BEEN DOCUMENTED     IN THE LITERATURE TO HAVE     A SENSITIVITY AND NEGATIVE     PREDICTIVE VALUE OF AT LEAST     98 TO 99%.  THE TEST RESULT     SHOULD BE CORRELATED WITH     AN ASSESSMENT OF THE CLINICAL     PROBABILITY OF DVT / VTE.  POCT I-STAT TROPONIN I     Status: None   Collection Time    02/10/13  3:30 PM      Result Value Range   Troponin i, poc 0.00  0.00 - 0.08 ng/mL   Comment 3            Comment: Due to the release kinetics of cTnI,     a negative result within the first hours     of the  onset of symptoms does not rule out     myocardial infarction with certainty.  If myocardial infarction is still suspected,     repeat the test at appropriate intervals.  TSH     Status: None   Collection Time    02/11/13  7:40 PM      Result Value Range   TSH 3.657  0.350 - 4.500 uIU/mL    Physical Findings: AIMS: Facial and Oral Movements Muscles of Facial Expression: None, normal Lips and Perioral Area: None, normal Jaw: None, normal Tongue: None, normal,Extremity Movements Upper (arms, wrists, hands, fingers): None, normal Lower (legs, knees, ankles, toes): None, normal, Trunk Movements Neck, shoulders, hips: None, normal, Overall Severity Severity of abnormal movements (highest score from questions above): None, normal Incapacitation due to abnormal movements: None, normal Patient's awareness of abnormal movements (rate only patient's report): No Awareness, Dental Status Current problems with teeth and/or dentures?: No Does patient usually wear dentures?: No  CIWA:    COWS:     Psychiatric Specialty Exam: See Psychiatric Specialty Exam and Suicide Risk Assessment completed by Attending Physician prior to discharge.  Discharge destination:  Home  Is patient on multiple antipsychotic therapies at discharge:  No   Has Patient had three or more failed trials of antipsychotic monotherapy by history:  No Recommended Plan for Multiple Antipsychotic Therapies:  N/A  Discharge Orders   Future Appointments Provider Department Dept Phone   03/09/2013 2:30 PM Alison Murray, MD [facility] C[facility] [phone]   03/13/2013 10:00 AM Sandford Craze, NP [facility] [phone]   05/28/2013 9:45 AM [facility] Ginette Otto [facility] [phone]   01/29/2014 2:45 PM Alison Murray, MD [facility] [phone]   Future Orders Complete By Expires     Activity as tolerated - No restrictions  As directed     Diet - low  sodium heart healthy  As directed         Medication List    STOP taking these medications       ALPRAZolam 0.5 MG tablet  Commonly known as:  XANAX     escitalopram 10 MG tablet  Commonly known as:  LEXAPRO     LORazepam 1 MG tablet  Commonly known as:  ATIVAN     SPRINTEC 28 0.25-35 MG-MCG tablet  Generic drug:  norgestimate-ethinyl estradiol      TAKE these medications     Indication   doxycycline 100 MG tablet  Commonly known as:  VIBRA-TABS  Take 1 tablet (100 mg total) by mouth every 12 (twelve) hours.   Indication:  infected gland     hydrOXYzine 25 MG tablet  Commonly known as:  ATARAX/VISTARIL  Take 1 tablet (25 mg total) by mouth every 4 (four) hours as needed for anxiety.      norethindrone-ethinyl estradiol 0.4-35 MG-MCG tablet  Commonly known as:  OVCON-35,BALZIVA,BRIELLYN  Take 1 tablet by mouth daily.   Indication:  to prevent pregnancy     omeprazole 20 MG capsule  Commonly known as:  PRILOSEC  Take 20 mg by mouth daily as needed (for heartburn).      propranolol 10 MG tablet  Commonly known as:  INDERAL  Take 1 tablet (10 mg total) by mouth 2 (two) times daily.   Indication:  anxiety and elevated heart rate     sertraline 25 MG tablet  Commonly known as:  ZOLOFT  Take 1 tablet (25 mg total) by mouth daily.   Indication:  Anxiety Disorder, Major Depressive Disorder     traZODone 50 MG  tablet  Commonly known as:  DESYREL  Take 1 tablet (50 mg total) by mouth at bedtime as needed for sleep.   Indication:  Trouble Sleeping           Follow-up Information   Follow up with Dr. Lolly Mustache- [facility] On 02/14/2013. (You are scheduled with Dr. Lolly Mustache on Wednesday, Feb 14, 2013 at 11:AM.  Please arrive at 9:30 AM to complete reisgtration forrms.  At the end of this appointment, you will be scheduled with a therapist.)    Contact information:   [address] [place], [place]   [address]  [phone]      Follow-up recommendations:   Activity:  As tolerated Diet:  Low-sodium heart healthy diet  Comments:  Patient will continue her care at [facility] IOP  Total Discharge Time:  Greater than 30 minutes.  SignedNanine Means, PMH-NP 02/12/2013, 2:12 PM

## 2013-02-12 NOTE — Progress Notes (Signed)
Date: 02/12/2013  Time: 11:00am  Group Topic/Focus:  Making Healthy Choices: The focus of this group is to help patients identify negative/unhealthy choices they were using prior to admission and identify positive/healthier coping strategies to replace them upon discharge.  Participation Level: Active  Participation Quality: Appropriate, Sharing and Supportive  Affect: Appropriate  Cognitive: Appropriate  Insight: Appropriate  Engagement in Group: Engaged and Supportive  Modes of Intervention: Discussion, Education, Problem-solving and Support  Additional Comments: none  Shalin Linders M  02/12/2013, 1:25 PM  

## 2013-02-12 NOTE — [facility] Suicide Risk Assessment (Signed)
Suicide Risk Assessment  Discharge Assessment     Demographic Factors:  Female, caucasian, employed  Mental Status Per Nursing Assessment::   On Admission:     Current Mental Status by Physician: Patient alert and oriented to 4. Denies AH/VH/SI/HI.  Loss Factors: Loss of significant relationship  Historical Factors: Family history of mental illness or substance abuse and Impulsivity  Risk Reduction Factors:   Employed, Positive social support and Positive coping skills or problem solving skills  Continued Clinical Symptoms:  Anxiety stabilized  Cognitive Features That Contribute To Risk:  Cognitively intact  Suicide Risk:  Minimal: No identifiable suicidal ideation.  Patients presenting with no risk factors but with morbid ruminations; may be classified as minimal risk based on the severity of the depressive symptoms  Discharge Diagnoses:   AXIS I:  Anxiety Disorder NOS AXIS II:  Deferred AXIS III:   Past Medical History  Diagnosis Date  . Anxiety   . History of chicken pox   . GERD (gastroesophageal reflux disease)    AXIS IV:  other psychosocial or environmental problems AXIS V:  61-70 mild symptoms  Plan Of Care/Follow-up recommendations:  Activity:  as tolerated Diet:  regular Follow up with outpatient appointments.  Is patient on multiple antipsychotic therapies at discharge:  No   Has Patient had three or more failed trials of antipsychotic monotherapy by history:  No  Recommended Plan for Multiple Antipsychotic Therapies: NA  Devin Foskey 02/12/2013, 10:18 AM

## 2013-02-12 NOTE — Tx Team (Signed)
Interdisciplinary Treatment Plan Update   Date Reviewed:  02/12/2013  Time Reviewed:  9:56 AM  Progress in Treatment:   Attending groups: Yes Participating in groups: Yes Taking medication as prescribed: Yes  Tolerating medication: Yes Family/Significant other contact made: Yes, contact made with mother.  Patient understands diagnosis: Yes  Discussing patient identified problems/goals with staff: Yes Medical problems stabilized or resolved: Yes Denies suicidal/homicidal ideation: Yes Patient has not harmed self or others: Yes  For review of initial/current patient goals, please see plan of care.  Estimated Length of Stay:  discharge home today  Reasons for Continued Hospitalization:   New Problems/Goals identified:    Discharge Plan or Barriers:   Home with outpatient follow up with Dr. Lolly Mustache   Additional Comments:   Patient reports doing well today and denies SI/HI.  She is rating depression at zero and anxiety at one.  Attendees:  Patient: Cynthia Robertson 02/12/2013 9:56 AM   Signature: Patrick North, MD 02/12/2013 9:56 AM  Signature: 02/12/2013 9:56 AM  Signature: 02/12/2013 9:56 AM  RNSignature:Beverly Terrilee Croak, RN 02/12/2013 9:56 AM  Signature:  Neill Loft RN 02/12/2013 9:56 AM  Signature:  Juline Patch, LCSW 02/12/2013 9:56 AM  Signature: Silverio Decamp, PMH-NP 02/12/2013 9:56 AM  Signature:  02/12/2013 9:56 AM  Signature:  02/12/2013 9:56 AM  Signature:    Signature:    Signature:      Scribe for Treatment Team:   Juline Patch,  02/12/2013 9:56 AM

## 2013-02-12 NOTE — [facility] Group Notes (Signed)
[facility] LCSW Aftercare Discharge Planning Group Note   02/12/2013 10:55 AM  Participation Quality:  appropriate  Mood/Affect:  Appropriate  Depression Rating:  0   Anxiety Rating:  1  Thoughts of Suicide:  No  Will you contract for safety?   NA  Current AVH:  No  Plan for Discharge/Comments:  Patient reports doing well today and being ready to discharge home.  Transportation Means: Patient has transportation.   Supports:  Patient has a good support system.   Marceil Welp, Joesph July

## 2013-02-12 NOTE — Progress Notes (Addendum)
D: Pt reports feeling wonderful this evening with her anxiety controlled for the shift. Pt observed interacting appropriately within the milieu. Pt is denying any SI at this time. Pt has no concerns she wishes for this writer to address at this time.  A: Scheduled antibiotic administered to this pt with encouragement for food intake to off set any possible GI symptoms. Continued support and availability as needed was extended to this pt.  R: Pt receptive to tx. Pt remains safe at this time with q57min checks.

## 2013-02-12 NOTE — Progress Notes (Addendum)
[facility] Adult Case Management Discharge Plan :  Will you be returning to the same living situation after discharge: Yes, patient will return to her home. At discharge, do you have transportation home?:Yes,  Patient will arrange transportation home. Do you have the ability to pay for your medications:  Yes, patient is able to obtain medications.   Release of information consent forms completed and in the chart;  Patient's signature needed at discharge.  Patient to Follow up at: Follow-up Information   Follow up with Dr. Lolly Mustache- [facility] On 02/14/2013. (You are scheduled with Dr. Lolly Mustache on Wednesday, Feb 14, 2013 at 11:AM.  Please arrive at 9:30 AM to complete reisgtration forrms.  At the end of this appointment, you will be scheduled with a therapist.)    Contact information:   [address] [place], [place]   [address]  [phone]      Patient denies SI/HI:  Patient no longer endorsing SI/HI or other thoughts of self harm.  Safety Planning and Suicide Prevention discussed:  .Reviewed with all patients during discharge planning group.   Wynn Banker 02/12/2013, 10:19 AM

## 2013-02-12 NOTE — Progress Notes (Signed)
Patient ID: Cynthia Robertson, female   DOB: 10-28-1990, 22 y.o.   MRN: [id number] Patient was discharged ambulatory to go home to live with mother.  She denies SI/HI.  She verbalizes understanding of her discharge meds and followup.  She was given scripts and her birth control pills were returned. She is hopeful about the future.

## 2013-02-14 ENCOUNTER — Encounter (HOSPITAL_COMMUNITY): Payer: Self-pay | Admitting: Psychiatry

## 2013-02-14 ENCOUNTER — Ambulatory Visit (INDEPENDENT_AMBULATORY_CARE_PROVIDER_SITE_OTHER): Payer: 59 | Admitting: Psychiatry

## 2013-02-14 VITALS — BP 127/61 | HR 57 | Ht 67.0 in | Wt 195.8 lb

## 2013-02-14 DIAGNOSIS — F411 Generalized anxiety disorder: Secondary | ICD-10-CM

## 2013-02-14 DIAGNOSIS — F410 Panic disorder [episodic paroxysmal anxiety] without agoraphobia: Secondary | ICD-10-CM

## 2013-02-14 DIAGNOSIS — K219 Gastro-esophageal reflux disease without esophagitis: Secondary | ICD-10-CM | POA: Insufficient documentation

## 2013-02-14 MED ORDER — SERTRALINE HCL 50 MG PO TABS: 50.0000 mg | ORAL_TABLET | Freq: Every day | ORAL | Status: DC
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
## 2013-02-15 NOTE — Progress Notes (Signed)
Patient Discharge Instructions:  Next Level Care Provider Has Access to the EMR, 02/15/13 Records provided to [facility] via [facility]/Epic access  Jerelene Redden, 02/15/2013, 2:38 PM

## 2013-02-16 ENCOUNTER — Telehealth (HOSPITAL_COMMUNITY): Payer: Self-pay

## 2013-02-19 ENCOUNTER — Telehealth (HOSPITAL_COMMUNITY): Payer: Self-pay | Admitting: Psychiatry

## 2013-02-19 ENCOUNTER — Telehealth (HOSPITAL_COMMUNITY): Payer: Self-pay

## 2013-02-19 NOTE — Telephone Encounter (Signed)
Call returned. Spoke to patient, c/o tingling and blurring of vision wihen took zoloft with out food. Had a a same episode 4 weeks ago and her doctor believe it was anxiety related.Rec To take 25 mg bid with food. Stop inderal and call us back if symptoms persist. Rec to call her internist if not improved. Agreed

## 2013-02-26 ENCOUNTER — Emergency Department (HOSPITAL_COMMUNITY): Payer: 59

## 2013-02-26 ENCOUNTER — Other Ambulatory Visit: Payer: Self-pay

## 2013-02-26 ENCOUNTER — Encounter (HOSPITAL_COMMUNITY): Payer: Self-pay | Admitting: *Deleted

## 2013-02-26 ENCOUNTER — Emergency Department (HOSPITAL_COMMUNITY)
Admission: EM | Admit: 2013-02-26 | Discharge: 2013-02-26 | Disposition: A | Discharge status: Home / Self Care | Payer: 59 | Attending: Emergency Medicine | Admitting: Emergency Medicine

## 2013-02-26 DIAGNOSIS — R0789 Other chest pain: Secondary | ICD-10-CM

## 2013-02-26 DIAGNOSIS — R0602 Shortness of breath: Secondary | ICD-10-CM | POA: Insufficient documentation

## 2013-02-26 DIAGNOSIS — K219 Gastro-esophageal reflux disease without esophagitis: Secondary | ICD-10-CM | POA: Insufficient documentation

## 2013-02-26 DIAGNOSIS — Z79899 Other long term (current) drug therapy: Secondary | ICD-10-CM | POA: Insufficient documentation

## 2013-02-26 DIAGNOSIS — F411 Generalized anxiety disorder: Secondary | ICD-10-CM | POA: Insufficient documentation

## 2013-02-26 DIAGNOSIS — Z8619 Personal history of other infectious and parasitic diseases: Secondary | ICD-10-CM | POA: Insufficient documentation

## 2013-02-26 LAB — BASIC METABOLIC PANEL
CO2: 17 mEq/L — ABNORMAL LOW (ref 19–32)
Calcium: 9.3 mg/dL (ref 8.4–10.5)
Chloride: 108 mEq/L (ref 96–112)
Glucose, Bld: 98 mg/dL (ref 70–99)
Sodium: 140 mEq/L (ref 135–145)

## 2013-02-26 LAB — CBC WITH DIFFERENTIAL/PLATELET
Eosinophils Relative: 1 % (ref 0–5)
HCT: 39 % (ref 36.0–46.0)
Lymphocytes Relative: 23 % (ref 12–46)
Lymphs Abs: 1.6 10*3/uL (ref 0.7–4.0)
MCV: 91.8 fL (ref 78.0–100.0)
Monocytes Absolute: 0.4 10*3/uL (ref 0.1–1.0)
Neutro Abs: 4.7 10*3/uL (ref 1.7–7.7)
Platelets: 226 10*3/uL (ref 150–400)
RBC: 4.25 MIL/uL (ref 3.87–5.11)
WBC: 6.7 10*3/uL (ref 4.0–10.5)

## 2013-02-26 NOTE — ED Provider Notes (Signed)
History     CSN: [id number]  Arrival date & time 02/26/13  0807   First MD Initiated Contact with Patient 02/26/13 [phone]      Chief Complaint  Patient presents with  . Irregular Heart Beat    (Consider location/radiation/quality/duration/timing/severity/associated sxs/prior treatment) HPI  Pt is a 22 yo F PMHx significant for anxiety presenting for two episodes of sharp diffuse chest pain w/ palpitations each lasting ten minutes in duration. Pt states both times she was sitting at rest when the CP came on. States in ten minutes the CP resolved with time. Pt states she had an instance of SOB and anxiety with each episode of CP, but those resolved in time as well. Pt states she had been feeling well. Denies any recent medication changes. Endorses she has been taking all home medications as prescribed. Denies RD or ETOH use. Denies fevers, chills, nausea, vomiting, diarrhea, abdominal pain, headache, visual disturbance, diaphoresis.   Past Medical History  Diagnosis Date  . Anxiety   . History of chicken pox   . GERD (gastroesophageal reflux disease)     Past Surgical History  Procedure Laterality Date  . Wisdom tooth extraction  04/2012    Family History  Problem Relation Age of Onset  . Hyperlipidemia Mother   . Stroke Maternal Grandfather   . Depression Father   . Alcohol abuse Father     History  Substance Use Topics  . Smoking status: Never Smoker   . Smokeless tobacco: Not on file  . Alcohol Use: Yes     Comment: Occ Beer,Alcohol    OB History   Grav Para Term Preterm Abortions TAB SAB Ect Mult Living                  Review of Systems  Constitutional: Negative for fever and chills.  HENT: Negative for neck pain and neck stiffness.   Eyes: Negative for photophobia, pain and visual disturbance.  Respiratory: Positive for chest tightness and shortness of breath.   Cardiovascular: Positive for chest pain and palpitations.  Gastrointestinal: Negative for  abdominal pain.  Musculoskeletal: Negative.   Neurological: Negative for syncope, speech difficulty, weakness, light-headedness and headaches.    Allergies  Review of patient's allergies indicates no known allergies.  Home Medications   Current Outpatient Rx  Name  Route  Sig  Dispense  Refill  . hydrOXYzine (ATARAX/VISTARIL) 25 MG tablet   Oral   Take 1 tablet (25 mg total) by mouth every 4 (four) hours as needed for anxiety.   30 tablet   0   . norethindrone-ethinyl estradiol (OVCON-35,BALZIVA,BRIELLYN) 0.4-35 MG-MCG tablet   Oral   Take 1 tablet by mouth daily.   1 Package   0   . omeprazole (PRILOSEC) 20 MG capsule   Oral   Take 20 mg by mouth daily as needed (for heartburn).          . propranolol (INDERAL) 10 MG tablet   Oral   Take 10 mg by mouth daily.         . sertraline (ZOLOFT) 50 MG tablet   Oral   Take 1 tablet (50 mg total) by mouth daily.   30 tablet   0     BP 122/57  Pulse 56  Temp(Src) 98.1 F (36.7 C) (Oral)  Resp 17  SpO2 100%  LMP 02/04/2013  Physical Exam  Constitutional: She is oriented to person, place, and time. She appears well-developed and well-nourished. No distress.  HENT:  Head: Normocephalic and atraumatic.  Mouth/Throat: Oropharynx is clear and moist.  Eyes: EOM are normal. Pupils are equal, round, and reactive to light.  Neck: Neck supple. Carotid bruit is not present.  Cardiovascular: Normal rate, regular rhythm, normal heart sounds and intact distal pulses.   Pulmonary/Chest: Effort normal and breath sounds normal. No respiratory distress. She exhibits no tenderness.  Abdominal: Soft. Bowel sounds are normal. There is no tenderness.  Musculoskeletal: Normal range of motion. She exhibits no edema.  Neurological: She is alert and oriented to person, place, and time.  Skin: Skin is warm and dry. She is not diaphoretic. No erythema.  Psychiatric: She has a normal mood and affect.    ED Course  Procedures (including  critical care time)   Date: 02/26/2013  Rate: 76  Rhythm: normal sinus rhythm  QRS Axis: normal  Intervals: normal  ST/T Wave abnormalities: normal  Conduction Disutrbances:none  Narrative Interpretation:   Old EKG Reviewed: sinus tachy on last EKG    Labs Reviewed  BASIC METABOLIC PANEL - Abnormal; Notable for the following:    CO2 17 (*)    All other components within normal limits  CBC WITH DIFFERENTIAL   Dg Chest 2 View  02/26/2013   *RADIOLOGY REPORT*  Clinical Data: Shortness of breath  CHEST - 2 VIEW  Comparison: 02/10/2013  Findings: Cardiomediastinal silhouette is stable.  No acute infiltrate or pleural effusion.  No pulmonary edema.  Bony thorax is unremarkable.  IMPRESSION: .  No active disease.  No significant change.   Original Report Authenticated By: Natasha Mead, M.D.     1. Non-cardiac chest pain       MDM  Patient is to be discharged with recommendation to follow up with PCP in regards to today's hospital visit. Chest pain is not likely of cardiac or pulmonary etiology d/t presentation, perc negative, VSS, no tracheal deviation, no JVD or new murmur, RRR, breath sounds equal bilaterally, EKG without acute abnormalities and negative CXR. Pt has been advised to return to the ED is CP becomes exertional, associated with diaphoresis or nausea, radiates to left jaw/arm, worsens or becomes concerning in any way. Pt appears reliable for follow up and is agreeable to discharge. Case has been discussed with and seen by Dr. Rhunette Croft who agrees with the above plan to discharge.          Jeannetta Ellis, PA-C 02/26/13 1639  Medical screening examination/treatment/procedure(s) were performed by non-physician practitioner and as supervising physician I was immediately available for consultation/collaboration.  Derwood Kaplan, MD 02/28/13 1024

## 2013-02-26 NOTE — ED Notes (Signed)
Patient states last night she started having what felt like irregular heart beat, patient states history of anxiety and rapid heart rate but now can control panic attacks and is on medication for rate control, patient NSR at this time

## 2013-02-28 ENCOUNTER — Encounter (HOSPITAL_COMMUNITY): Payer: Self-pay | Admitting: Psychiatry

## 2013-02-28 ENCOUNTER — Ambulatory Visit (INDEPENDENT_AMBULATORY_CARE_PROVIDER_SITE_OTHER): Payer: 59 | Admitting: Psychiatry

## 2013-02-28 VITALS — BP 125/73 | HR 77 | Ht 67.0 in | Wt 189.6 lb

## 2013-02-28 DIAGNOSIS — F411 Generalized anxiety disorder: Secondary | ICD-10-CM

## 2013-02-28 DIAGNOSIS — F410 Panic disorder [episodic paroxysmal anxiety] without agoraphobia: Secondary | ICD-10-CM

## 2013-02-28 MED ORDER — SERTRALINE HCL 100 MG PO TABS: 100.0000 mg | ORAL_TABLET | Freq: Every day | ORAL | Status: DC

## 2013-02-28 NOTE — Progress Notes (Signed)
[facility] [id number] Progress Note  Cynthia Robertson [id number] 22 y.o.  02/28/2013 10:53 AM  Chief Complaint: I went to emergency room for chest pain.  I have a lot of anxiety.  I felt I was dying.  History of Present Illness: Patient is 22 year old Caucasian female who was seen first time on May 7th for her initial evaluation. [facility] to May 5 due to significant anxiety symptoms.  Her Zoloft was increased on the last visit.  Patient was feeling better and her anxiety and depression until 2 days ago she had chest pain and she was seen in emergency room.  Her blood results were normal.  Patient feels that her anxiety is much better.  She is tolerating the Zoloft 50 mg and Inderal 10 mg daily.  Daily takes Vistaril.  Patient do not know what trigger the anxiety attack.  However she is relieved that her blood work were normal.  Patient admitted less depressive symptoms, she sleeping better.  She has agitation or anger.  She scheduled to see therapist next week.  She denies any active or passive suicidal thoughts.  She's working on her anxiety symptoms but she realized that it will take some time.  Patient denies any tremors or shakes.  She is now open to increase her Zoloft.  Suicidal Ideation: No Plan Formed: No Patient has means to carry out plan: No  Homicidal Ideation: No Plan Formed: No Patient has means to carry out plan: No  Review of Systems  HENT: Negative.   Respiratory: Negative.   Cardiovascular: Positive for palpitations.  Gastrointestinal: Positive for heartburn.  Musculoskeletal: Negative.   Skin: Negative.   Neurological: Negative.   Psychiatric/Behavioral: Positive for depression. The patient is nervous/anxious.     Psychiatric: Agitation: No Hallucination: No Depressed Mood: Yes Insomnia: No Hypersomnia: No Altered Concentration: No Feels Worthless: No Grandiose Ideas: No Belief In Special Powers:  No New/Increased Substance Abuse: No Compulsions: No  Neurologic: Headache: No Seizure: No Paresthesias: No  Medical History: Patient is diagnosed with sinus tachycardia and acid reflux disease.  Psychosocial history. This was born in [place].  Her.  Assertive divorced when she was only 22 years old.  She denies any history of physical sexual or verbal abuse.  She is single and living with her mother and boyfriend.  She has no children.  Alcohol and substance use history. Patient admitted drinking in March to see her to help anxiety and panic attack however claims to be sober since release from the hospital.  Patient has a history of binge drinking, DWI or any withdrawal symptoms.  Education and work history. Patient has an associate degree in business at the session.  She's working as a Optician, dispensing for 16 months.  Outpatient Encounter Prescriptions as of 02/28/2013  Medication Sig Dispense Refill  . hydrOXYzine (ATARAX/VISTARIL) 25 MG tablet Take 1 tablet (25 mg total) by mouth every 4 (four) hours as needed for anxiety.  30 tablet  0  . norethindrone-ethinyl estradiol (OVCON-35,BALZIVA,BRIELLYN) 0.4-35 MG-MCG tablet Take 1 tablet by mouth daily.  1 Package  0  . omeprazole (PRILOSEC) 20 MG capsule Take 20 mg by mouth daily as needed (for heartburn).       . propranolol (INDERAL) 10 MG tablet Take 10 mg by mouth daily.      . sertraline (ZOLOFT) 100 MG tablet Take 1 tablet (100 mg total) by mouth daily.  30 tablet  0  . [  DISCONTINUED] sertraline (ZOLOFT) 50 MG tablet Take 1 tablet (50 mg total) by mouth daily.  30 tablet  0   No facility-administered encounter medications on file as of 02/28/2013.    Past Psychiatric History/Hospitalization(s): Patient denies any history of psychiatric inpatient treatment other than [facility] due to significant anxiety and passive suicidal thinking. She denies any history of paranoia,  hallucination, mania, aggression, violence, suicidal at times. She has never seen a psychiatrist before psychiatric inpatient. She took one dose of Celexa and wonders of Lexapro by her primary care provider. She took a few doses of Xanax and then she stopped because it was making her worse. Anxiety: Yes Bipolar Disorder: No Depression: Yes Mania: No Psychosis: No Schizophrenia: No Personality Disorder: No Hospitalization for psychiatric illness: Yes History of Electroconvulsive Shock Therapy: No Prior Suicide Attempts: No  Physical Exam: Constitutional:  BP 125/73  Pulse 77  Ht 5\' 7"  (1.702 m)  Wt 189 lb 9.6 oz (86.002 kg)  BMI 29.69 kg/m2  LMP 02/04/2013  General Appearance: alert, oriented, no acute distress and well nourished  Musculoskeletal: Strength & Muscle Tone: within normal limits Gait & Station: normal Patient leans: N/A  Psychiatric: Speech (describe rate, volume, coherence, spontaneity, and abnormalities if any): Soft clear and coherent with normal tone volume.  Thought Process (describe rate, content, abstract reasoning, and computation): Logical organized goal-directed.  Associations: Coherent, Relevant and Intact  Thoughts: normal  Mental Status: Orientation: oriented to person, place, time/date and situation Mood & Affect: anxiety Attention Span & Concentration: Good  Medical Decision Making (Choose Three): Established Problem, Stable/Improving (1), Review of Psycho-Social Stressors (1), Review or order clinical lab tests (1), Review of Last Therapy Session (1), Review of Medication Regimen & Side Effects (2) and Review of New Medication or Change in Dosage (2)  Assessment: Axis I: Generalized anxiety disorder , panic disorder without agoraphobia .    Axis II: Deferred  Axis III:  Patient Active Problem List   Diagnosis Date Noted  . GERD (gastroesophageal reflux disease) 02/14/2013  . Sinus tachycardia 02/12/2013  . Generalized anxiety disorder  01/22/2013    Axis IV: Mild to moderate  Axis V: 70-75   Plan: I review her symptoms, recent discharge summary from the emergency department and blood work results.  Her vitals are stable. Reassurance given.  I would increase Zoloft to 75 mg for 2 weeks and then would increase to 100 mg daily.  For now she will continue Inderal 10 mg daily.  Patient is scheduled to see therapist next week.  Explain in detail the risk and benefits of medication and recommend to call us back if she is any question or concern or if she feels worsening of the symptom.  Safety plan discussed in detail.  Time spent 25 minutes.  More than 50% of the time spent and psychoeducation consonant and coordination of care.  Lavender Stanke T., MD 02/28/2013

## 2013-03-01 ENCOUNTER — Ambulatory Visit: Payer: 59 | Admitting: Internal Medicine

## 2013-03-06 ENCOUNTER — Ambulatory Visit (INDEPENDENT_AMBULATORY_CARE_PROVIDER_SITE_OTHER): Payer: 59 | Admitting: Licensed Clinical Social Worker

## 2013-03-06 ENCOUNTER — Encounter (HOSPITAL_COMMUNITY): Payer: Self-pay | Admitting: Licensed Clinical Social Worker

## 2013-03-06 DIAGNOSIS — F410 Panic disorder [episodic paroxysmal anxiety] without agoraphobia: Secondary | ICD-10-CM | POA: Insufficient documentation

## 2013-03-06 DIAGNOSIS — F411 Generalized anxiety disorder: Secondary | ICD-10-CM
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
## 2013-03-09 ENCOUNTER — Ambulatory Visit: Payer: 59 | Admitting: Family Medicine

## 2013-03-09 ENCOUNTER — Ambulatory Visit: Payer: 59 | Admitting: Obstetrics and Gynecology

## 2013-03-13 ENCOUNTER — Ambulatory Visit (INDEPENDENT_AMBULATORY_CARE_PROVIDER_SITE_OTHER): Payer: 59 | Admitting: Family

## 2013-03-13 ENCOUNTER — Encounter: Payer: Self-pay | Admitting: Family

## 2013-03-13 VITALS — BP 106/68 | HR 57 | Temp 98.4°F | Resp 16 | Ht 68.25 in | Wt 189.0 lb

## 2013-03-13 DIAGNOSIS — N611 Abscess of the breast and nipple: Secondary | ICD-10-CM

## 2013-03-13 DIAGNOSIS — Z0000 Encounter for general adult medical examination without abnormal findings: Secondary | ICD-10-CM

## 2013-03-13 DIAGNOSIS — N61 Mastitis without abscess: Secondary | ICD-10-CM

## 2013-03-13 DIAGNOSIS — F411 Generalized anxiety disorder: Secondary | ICD-10-CM

## 2013-03-13 DIAGNOSIS — K219 Gastro-esophageal reflux disease without esophagitis: Secondary | ICD-10-CM

## 2013-03-13 LAB — LIPID PANEL
HDL: 48 mg/dL (ref >39)
Total CHOL/HDL Ratio: 3.8 Ratio
VLDL: 43 mg/dL — ABNORMAL HIGH (ref 0–40)

## 2013-03-13 LAB — BASIC METABOLIC PANEL WITH GFR
CO2: 22 mEq/L (ref 19–32)
Chloride: 108 mEq/L (ref 96–112)
GFR, Est Non African American: >89 mL/min
Potassium: 4.2 mEq/L (ref 3.5–5.3)
Sodium: 136 mEq/L (ref 135–145)

## 2013-03-13 LAB — HEPATIC FUNCTION PANEL
AST: 23 U/L (ref 0–37)
Albumin: 4 g/dL (ref 3.5–5.2)
Alkaline Phosphatase: 62 U/L (ref 39–117)
Bilirubin, Direct: 0.1 mg/dL (ref 0.0–0.3)
Indirect Bilirubin: 0.4 mg/dL (ref 0.0–0.9)
Total Bilirubin: 0.5 mg/dL (ref 0.3–1.2)

## 2013-03-13 LAB — URINALYSIS, ROUTINE W REFLEX MICROSCOPIC
Glucose, UA: NEGATIVE mg/dL
Hgb urine dipstick: NEGATIVE
Leukocytes, UA: NEGATIVE
Protein, ur: NEGATIVE mg/dL
Urobilinogen, UA: 1 mg/dL (ref 0.0–1.0)

## 2013-03-13 MED ORDER — CEPHALEXIN 500 MG PO CAPS: 500.0000 mg | ORAL_CAPSULE | Freq: Four times a day (QID) | ORAL | Status: AC

## 2013-03-13 MED ORDER — OMEPRAZOLE 40 MG PO CPDR: 40.0000 mg | DELAYED_RELEASE_CAPSULE | Freq: Every day | ORAL | Status: DC

## 2013-03-13 NOTE — Assessment & Plan Note (Signed)
Continue healthy diet, exercise.  Obtain fasting labs.  Pap up to date.

## 2013-03-13 NOTE — Assessment & Plan Note (Signed)
Procedure including risks/benefits explained to patient.  Questions were answered. After informed consent was obtained and a time out completed, site was cleansed with betadine and then alcohol. 1% Lidocaine with epinephrine was injected under lesion and incision and drainage was performed. A small amount of firm purulence was expressed.  Pt tolerated procedure well.   Pt instructed  to contact us if he develops redness, drainage or swelling at the site.  Pt may use tylenol as needed for discomfort today.

## 2013-03-13 NOTE — Assessment & Plan Note (Signed)
Improved on zoloft. Continue same along with outpatient counseling at behavioral.

## 2013-03-13 NOTE — Progress Notes (Signed)
Subjective:    Patient ID: Cynthia Robertson, female    DOB: Jun 30, 1991, 22 y.o.   MRN: [id number]  HPI  [name] is a 22 yr old female who presents today for cpx.  Immunizations: last tetanus was "in high school."  Diet: Reports healthy diet.  Exercise: walks daily.  Pap Smear: current  GERD-  Currently on omeprazole.  She has been taking x >1 year.    Cyst- Reports "infected sweat gland" near left breast, completed abx given at hospital but still has "pus every day"  Anxiety- s/p inpatient hospitalization. She is currently maintaine on zoloft and reports feeling "much better."      Review of Systems  Constitutional:       Reports 20 pound weight loss since April which she attributed to anxiety.   HENT: Positive for ear pain.   Respiratory: Negative for cough and shortness of breath.   Cardiovascular: Negative for chest pain.  Gastrointestinal: Negative for nausea and vomiting.  Genitourinary: Negative for dysuria and menstrual problem.  Musculoskeletal: Negative for myalgias and arthralgias.  Skin: Negative for rash.  Neurological: Positive for headaches.  Hematological: Negative for adenopathy.  Psychiatric/Behavioral:       Denies depression   Past Medical History  Diagnosis Date  . Anxiety   . History of chicken pox   . GERD (gastroesophageal reflux disease)     History   Social History  . Marital Status: Single    Spouse Name: N/A    Number of Children: N/A  . Years of Education: N/A   Occupational History  . Not on file.   Social History Main Topics  . Smoking status: Never Smoker   . Smokeless tobacco: Not on file  . Alcohol Use: No     Comment: Occ Beer,Alcohol, stopped drinking April 11th, due to increased panic attacks. Drank excessively after her father committed suicide   . Drug Use: No  . Sexually Active: Yes -- Female partner(s)    Birth Control/ Protection: Condom, Pill   Other Topics Concern  . 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 Wisdom tooth extraction  04/2012    Family History  Problem Relation Age of Onset  . Hyperlipidemia Mother   . Stroke Maternal Grandfather   . Depression Father   . Alcohol abuse Father   . Suicidality Father     No Known Allergies  Current Outpatient Prescriptions on File Prior to Visit  Medication Sig Dispense Refill  . hydrOXYzine (ATARAX/VISTARIL) 25 MG tablet Take 1 tablet (25 mg total) by mouth every 4 (four) hours as needed for anxiety.  30 tablet  0  . norethindrone-ethinyl estradiol (OVCON-35,BALZIVA,BRIELLYN) 0.4-35 MG-MCG tablet Take 1 tablet by mouth daily.  1 Package  0  . propranolol (INDERAL) 10 MG tablet Take 10 mg by mouth daily.       No current facility-administered medications on file prior to visit.    BP 106/68  Pulse 57  Temp(Src) 98.4 F (36.9 C) (Oral)  Resp 16  Ht 5' 8.25" (1.734 m)  Wt 189 lb (85.73 kg)  BMI 28.51 kg/m2  SpO2 99%  LMP 02/27/2013        Objective:  Physical Exam  Physical Exam  Constitutional: She is oriented to person, place, and time. She appears well-developed and well-nourished. No distress.  HENT:  Head: Normocephalic and atraumatic.  Right Ear: Tympanic membrane and ear canal normal.  Left Ear: Tympanic membrane and ear canal normal.  Mouth/Throat: Oropharynx is clear and moist.  Eyes: Pupils are equal, round, and reactive to light. No scleral icterus.  Neck: Normal range of motion. No thyromegaly present.  Cardiovascular: Normal rate and regular rhythm.   No murmur heard. Pulmonary/Chest: Effort normal and breath sounds normal. No respiratory distress. He has no wheezes. She has no rales. She exhibits no tenderness.  Abdominal: Soft. Bowel sounds are normal. He exhibits no distension and no mass.  There is no tenderness. There is no rebound and no guarding.  Musculoskeletal: She exhibits no edema.  Lymphadenopathy:    She has no cervical adenopathy.  Neurological: She is alert and oriented to person, place, and time.She exhibits normal muscle tone. Coordination normal.  Skin: Skin is warm and dry. firm, pea sized abscess left anterior chest wall left side of sternum. Psychiatric: She has a normal mood and affect. Her behavior is normal. Judgment and thought content normal.  Breast/pelvic- deferred to GYN.       Assessment & Plan:         Assessment & Plan:

## 2013-03-13 NOTE — Assessment & Plan Note (Signed)
Uncontrolled.  Increase omeprazole from 20mg  to 40mg .  Discussed GERD dietary changes.

## 2013-03-13 NOTE — Patient Instructions (Addendum)
Call if increased pain, redness, drainage from left breast cyst or if it does not resolve in 1 week. Complete lab work prior to leaving.  Start increased dose of omeprozole. Follow up in 3 months.

## 2013-03-16 ENCOUNTER — Encounter: Payer: Self-pay | Admitting: Family

## 2013-03-25 ENCOUNTER — Other Ambulatory Visit (HOSPITAL_COMMUNITY): Payer: Self-pay | Admitting: Psychiatry

## 2013-03-26 ENCOUNTER — Other Ambulatory Visit (HOSPITAL_COMMUNITY): Payer: Self-pay | Admitting: Psychiatry

## 2013-03-27 ENCOUNTER — Ambulatory Visit (HOSPITAL_COMMUNITY): Payer: Self-pay | Admitting: Licensed Clinical Social Worker

## 2013-03-28 ENCOUNTER — Ambulatory Visit (HOSPITAL_COMMUNITY): Payer: Self-pay | Admitting: Psychiatry

## 2013-04-03 ENCOUNTER — Encounter (HOSPITAL_COMMUNITY): Payer: Self-pay | Admitting: Psychiatry

## 2013-04-03 ENCOUNTER — Ambulatory Visit (INDEPENDENT_AMBULATORY_CARE_PROVIDER_SITE_OTHER): Payer: 59 | Admitting: Psychiatry

## 2013-04-03 VITALS — BP 113/59 | HR 60 | Ht 67.0 in | Wt 189.2 lb

## 2013-04-03 DIAGNOSIS — F411 Generalized anxiety disorder: Secondary | ICD-10-CM

## 2013-04-03 DIAGNOSIS — F410 Panic disorder [episodic paroxysmal anxiety] without agoraphobia: Secondary | ICD-10-CM

## 2013-04-03 MED ORDER — PROPRANOLOL HCL 10 MG PO TABS: 10.0000 mg | ORAL_TABLET | Freq: Every day | ORAL | Status: DC

## 2013-04-03 MED ORDER — SERTRALINE HCL 100 MG PO TABS: 100.0000 mg | ORAL_TABLET | Freq: Every day | ORAL | Status: DC

## 2013-04-03 NOTE — Progress Notes (Signed)
[facility] [id number] Progress Note  NEETU Robertson [id number] 22 y.o.  04/03/2013 3:20 PM  Chief Complaint: Medication management and followup.    History of Present Illness: Patient is 22 year old Caucasian female who came for her followup appointment.  She's taking Zoloft 100 mg daily.  She is less anxious and less depressed.  She says any recent panic attack or any visit to the emergency room.  She sleeping better.  She denies any recent crying spells or any depressive thoughts.  She has annual physical he was normal.  She has blood work which was also normal.  She's taking Inderal 10 mg daily.  She is no longer taking Vistaril .  She sleeping better.  She has any concerns or any side effects of medication.  She's not drinking or using any illegal substance.  She saw Baxter Hire but missed last appointment however she scheduled to see her again next week.  Suicidal Ideation: No Plan Formed: No Patient has means to carry out plan: No  Homicidal Ideation: No Plan Formed: No Patient has means to carry out plan: No  Review of Systems  HENT: Negative.   Respiratory: Negative.   Musculoskeletal: Negative.   Skin: Negative.   Neurological: Negative.     Psychiatric: Agitation: No Hallucination: No Depressed Mood: No Insomnia: No Hypersomnia: No Altered Concentration: No Feels Worthless: No Grandiose Ideas: No Belief In Special Powers: No New/Increased Substance Abuse: No Compulsions: No  Neurologic: Headache: No Seizure: No Paresthesias: No  Medical History: Patient is diagnosed with sinus tachycardia and acid reflux disease.  Psychosocial history. This was born in [place].  Her.  Assertive divorced when she was only 22 years old.  She denies any history of physical sexual or verbal abuse.  She is single and living with her mother and boyfriend.  She has no children.  Alcohol and substance use history. Patient admitted drinking in March to see her to  help anxiety and panic attack however claims to be sober since release from the hospital.  Patient has a history of binge drinking, DWI or any withdrawal symptoms.  Education and work history. Patient has an associate degree in business at the session.  She's working as a Optician, dispensing for 16 months.  Outpatient Encounter Prescriptions as of 04/03/2013  Medication Sig Dispense Refill  . esomeprazole (NEXIUM) 20 MG capsule Take 20 mg by mouth daily before breakfast.      . norethindrone-ethinyl estradiol (OVCON-35,BALZIVA,BRIELLYN) 0.4-35 MG-MCG tablet Take 1 tablet by mouth daily.  1 Package  0  . propranolol (INDERAL) 10 MG tablet Take 1 tablet (10 mg total) by mouth daily.  30 tablet  1  . sertraline (ZOLOFT) 100 MG tablet Take 1 tablet (100 mg total) by mouth daily.  30 tablet  1  . [DISCONTINUED] propranolol (INDERAL) 10 MG tablet Take 10 mg by mouth daily.      . [DISCONTINUED] sertraline (ZOLOFT) 100 MG tablet Take 50 mg by mouth daily.      Marland Kitchen omeprazole (PRILOSEC) 40 MG capsule Take 1 capsule (40 mg total) by mouth daily.  30 capsule  3  . [DISCONTINUED] hydrOXYzine (ATARAX/VISTARIL) 25 MG tablet Take 1 tablet (25 mg total) by mouth every 4 (four) hours as needed for anxiety.  30 tablet  0   No facility-administered encounter medications on file as of 04/03/2013.    Past Psychiatric History/Hospitalization(s): Patient [facility] due to significant anxiety and passive suicidal thinking.  She denies any history of paranoia, hallucination, mania, aggression, violence, suicidal at times. She has never seen a psychiatrist before psychiatric inpatient. She tried Celexa, Lexapro from primary care physician .  She also took Xanax in the past from primary care physician but stopped because it was not working.   Anxiety: Yes Bipolar Disorder: No Depression: Yes Mania: No Psychosis: No Schizophrenia: No Personality Disorder:  No Hospitalization for psychiatric illness: Yes History of Electroconvulsive Shock Therapy: No Prior Suicide Attempts: No  Physical Exam: Constitutional:  BP 113/59  Pulse 60  Ht 5\' 7"  (1.702 m)  Wt 189 lb 3.2 oz (85.821 kg)  BMI 29.63 kg/m2  LMP 02/27/2013  General Appearance: alert, oriented, no acute distress and well nourished  Musculoskeletal: Strength & Muscle Tone: within normal limits Gait & Station: normal Patient leans: N/A  Psychiatric: Speech (describe rate, volume, coherence, spontaneity, and abnormalities if any): Soft clear and coherent with normal tone volume.  Thought Process (describe rate, content, abstract reasoning, and computation): Logical organized goal-directed.  Associations: Coherent, Relevant and Intact  Thoughts: normal  Mental Status: Orientation: oriented to person, place, time/date and situation Mood & Affect: anxiety Attention Span & Concentration: Good  Medical Decision Making (Choose Three): Established Problem, Stable/Improving (1), Review or order clinical lab tests (1), Review of Last Therapy Session (1) and Review of New Medication or Change in Dosage (2)  Assessment: Axis I: Generalized anxiety disorder , panic disorder without agoraphobia .    Axis II: Deferred  Axis III:  Patient Active Problem List   Diagnosis Date Noted  . Routine general medical examination at a health care facility 03/13/2013  . Abscess of breast 03/13/2013  . Panic disorder without agoraphobia 03/06/2013  . GERD (gastroesophageal reflux disease) 02/14/2013  . Sinus tachycardia 02/12/2013  . Generalized anxiety disorder 01/22/2013    Axis IV: Mild to moderate  Axis V: 70-75   Plan:  Patient is doing better from the past.  She's taking Zoloft 100 mg daily.  I will discontinue Vistaril since she is no longer taking it.  I will continue Inderal 10 mg daily.  Recommend to see therapist for coping and social skills.  I will see her again in 2 months.   Recommend to call us back if she is any question or concern if she would worsening of the symptoms.  Renezmae Canlas T., MD 04/03/2013

## 2013-04-10 ENCOUNTER — Ambulatory Visit (HOSPITAL_COMMUNITY): Payer: Self-pay | Admitting: Licensed Clinical Social Worker

## 2013-04-24 ENCOUNTER — Ambulatory Visit (HOSPITAL_COMMUNITY): Payer: Self-pay | Admitting: Licensed Clinical Social Worker

## 2013-05-08 ENCOUNTER — Ambulatory Visit (HOSPITAL_COMMUNITY): Payer: Self-pay | Admitting: Licensed Clinical Social Worker

## 2013-05-28 ENCOUNTER — Other Ambulatory Visit: Payer: 59

## 2013-05-29 ENCOUNTER — Other Ambulatory Visit: Payer: Self-pay | Admitting: *Deleted

## 2013-05-29 ENCOUNTER — Telehealth (HOSPITAL_COMMUNITY): Payer: Self-pay | Admitting: *Deleted

## 2013-05-29 ENCOUNTER — Ambulatory Visit (INDEPENDENT_AMBULATORY_CARE_PROVIDER_SITE_OTHER): Payer: 59 | Admitting: Family

## 2013-05-29 ENCOUNTER — Encounter: Payer: Self-pay | Admitting: Family

## 2013-05-29 VITALS — BP 120/62 | HR 63 | Temp 97.9°F | Ht 67.0 in | Wt 179.1 lb

## 2013-05-29 DIAGNOSIS — J019 Acute sinusitis, unspecified: Secondary | ICD-10-CM

## 2013-05-29 MED ORDER — AMOXICILLIN-POT CLAVULANATE 875-125 MG PO TABS: 1.0000 | ORAL_TABLET | Freq: Two times a day (BID) | ORAL | Status: DC

## 2013-05-29 NOTE — Patient Instructions (Addendum)

## 2013-05-29 NOTE — Assessment & Plan Note (Signed)
Will rx with augmentin, advised pt to use back up birth control due to risk of interaction with OCP. Also recommended trial of claritin and nasal saline spray.

## 2013-05-29 NOTE — Progress Notes (Signed)
  Subjective:    Patient ID: Cynthia Robertson, female    DOB: 02-26-1991, 22 y.o.   MRN: [id number]  HPI  [name] is a 22 yr old female who presents today with chief complaint of sinus congestion. Has been present x 2 weeks, worse x 3-4 days.  Associated low grade temp 99.2 yesterday.  Denies cough. Reports associated sore throat and ear pain.    Review of Systems    see HPI  Past Medical History  Diagnosis Date  . Anxiety   . History of chicken pox   . GERD (gastroesophageal reflux disease)     History   Social History  . Marital Status: Single    Spouse Name: N/A    Number of Children: N/A  . Years of Education: N/A   Occupational History  . Not on file.   Social History Main Topics  . Smoking status: Never Smoker   . Smokeless tobacco: Never Used  . Alcohol Use: No     Comment: Occ Beer,Alcohol, stopped drinking April 11th, due to increased panic attacks. Drank excessively after her father committed suicide   . Drug Use: No  . Sexual Activity: Yes    Partners: Male    Birth Control/ Protection: Condom, Pill   Other Topics Concern  . 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 Wisdom tooth extraction  04/2012    Family History  Problem Relation Age of Onset  . Hyperlipidemia Mother   . Stroke Maternal Grandfather   . Depression Father   . Alcohol abuse Father   . Suicidality Father     No Known Allergies  Current Outpatient Prescriptions on File Prior to Visit  Medication Sig Dispense Refill  . norethindrone-ethinyl estradiol (OVCON-35,BALZIVA,BRIELLYN) 0.4-35 MG-MCG tablet Take 1 tablet by mouth daily.  1 Package  0  . propranolol (INDERAL) 10 MG tablet Take 1 tablet (10 mg total) by mouth daily.  30 tablet  1   No current  facility-administered medications on file prior to visit.    BP 120/62  Pulse 63  Temp(Src) 97.9 F (36.6 C) (Oral)  Ht 5\' 7"  (1.702 m)  Wt 179 lb 1.3 oz (81.23 kg)  BMI 28.04 kg/m2  SpO2 96%  LMP 05/22/2013    Objective:   Physical Exam  Constitutional: She is oriented to person, place, and time. She appears well-developed and well-nourished. No distress.  HENT:  Head: Normocephalic and atraumatic.  Right Ear: Tympanic membrane is not retracted.  Left Ear: Tympanic membrane and ear canal normal.  Mouth/Throat: No oropharyngeal exudate, posterior oropharyngeal edema or posterior oropharyngeal erythema.  No maxillary or frontal sinus tenderness to palpation  Cardiovascular: Normal rate and regular rhythm.   No murmur heard. Pulmonary/Chest: Effort normal and breath sounds normal. No respiratory distress. She has no wheezes. She has no rales. She exhibits no tenderness.  Lymphadenopathy:    She has no cervical adenopathy.  Neurological: She is alert and oriented to person, place, and time.  Psychiatric: She has a normal mood and affect. Her behavior is normal. Judgment and thought content normal.          Assessment & Plan:

## 2013-05-29 NOTE — Progress Notes (Signed)
Pt called, at pharmacy & Rx sent today not received; phoned in order to [facility] in [place] per pt request/SLS

## 2013-05-29 NOTE — Addendum Note (Signed)
Addended by: Sandford Craze on: 05/29/2013 02:19 PM   Modules accepted: Orders

## 2013-05-29 NOTE — Telephone Encounter (Signed)
Verifying refill status

## 2013-06-04 ENCOUNTER — Ambulatory Visit (HOSPITAL_COMMUNITY): Payer: Self-pay | Admitting: Psychiatry

## 2013-06-12 ENCOUNTER — Other Ambulatory Visit: Payer: 59

## 2013-06-13 ENCOUNTER — Telehealth: Payer: Self-pay | Admitting: Obstetrics & Gynecology

## 2013-06-13 NOTE — Telephone Encounter (Signed)
Patient dnka TSH reck appointment 06/12/13. Patient rescheduled to 06/27/13 at 9:45.

## 2013-06-17 ENCOUNTER — Other Ambulatory Visit (HOSPITAL_COMMUNITY): Payer: Self-pay | Admitting: Psychiatry

## 2013-06-18 ENCOUNTER — Telehealth (HOSPITAL_COMMUNITY): Payer: Self-pay

## 2013-06-18 NOTE — Telephone Encounter (Signed)
06/18/13 Patient called and left msg at 4:48pm in reference to scheduling an appt and medicatin refill - returned call at 4:53pm the pt mailbox is full.Marland KitchenMarguerite Robertson

## 2013-06-19 ENCOUNTER — Other Ambulatory Visit (HOSPITAL_COMMUNITY): Payer: Self-pay | Admitting: Psychiatry

## 2013-06-19 DIAGNOSIS — F411 Generalized anxiety disorder: Secondary | ICD-10-CM

## 2013-06-19 MED ORDER — PROPRANOLOL HCL 10 MG PO TABS: 10.0000 mg | ORAL_TABLET | Freq: Every day | ORAL | Status: DC

## 2013-06-26 ENCOUNTER — Encounter: Payer: Self-pay | Admitting: Family

## 2013-06-26 ENCOUNTER — Ambulatory Visit (INDEPENDENT_AMBULATORY_CARE_PROVIDER_SITE_OTHER): Payer: 59 | Admitting: Family

## 2013-06-26 VITALS — BP 120/70 | HR 57 | Temp 98.2°F | Resp 16 | Wt 181.1 lb

## 2013-06-26 DIAGNOSIS — N390 Urinary tract infection, site not specified: Secondary | ICD-10-CM

## 2013-06-26 DIAGNOSIS — R300 Dysuria: Secondary | ICD-10-CM

## 2013-06-26 LAB — POCT URINALYSIS DIPSTICK
Bilirubin, UA: NEGATIVE
Ketones, UA: NEGATIVE
Nitrite, UA: POSITIVE
Protein, UA: 100
pH, UA: 6.5

## 2013-06-26 MED ORDER — CIPROFLOXACIN HCL 500 MG PO TABS: 500.0000 mg | ORAL_TABLET | Freq: Two times a day (BID) | ORAL | Status: DC

## 2013-06-26 NOTE — Addendum Note (Signed)
Addended by: Regis Bill on: 06/26/2013 01:45 PM   Modules accepted: Orders

## 2013-06-26 NOTE — Patient Instructions (Addendum)
Urinary Tract Infection °A urinary tract infection (UTI) can occur any place along the urinary tract. The tract includes the kidneys, ureters, bladder, and urethra. A type of germ called bacteria often causes a UTI. UTIs are often helped with antibiotic medicine.  °HOME CARE  °· If given, take antibiotics as told by your doctor. Finish them even if you start to feel better. °· Drink enough fluids to keep your pee (urine) clear or pale yellow. °· Avoid tea, drinks with caffeine, and bubbly (carbonated) drinks. °· Pee often. Avoid holding your pee in for a long time. °· Pee before and after having sex (intercourse). °· Wipe from front to back after you poop (bowel movement) if you are a woman. Use each tissue only once. °GET HELP RIGHT AWAY IF:  °· You have back pain. °· You have lower belly (abdominal) pain. °· You have chills. °· You feel sick to your stomach (nauseous). °· You throw up (vomit). °· Your burning or discomfort with peeing does not go away. °· You have a fever. °· Your symptoms are not better in 3 days. °MAKE SURE YOU:  °· Understand these instructions. °· Will watch your condition. °· Will get help right away if you are not doing well or get worse. °Document Released: 03/15/2008 Document Revised: 06/21/2012 Document Reviewed: 04/27/2012 °Exit[employer]® Patient Information ©2014 [employer]. ° °

## 2013-06-26 NOTE — Assessment & Plan Note (Signed)
New.  UA notes mod leuks, large blood + nitrites.  Will rx with cipro and plan to send urine for culture.

## 2013-06-26 NOTE — Progress Notes (Signed)
  Subjective:    Patient ID: Cynthia [facility], female    DOB: May 30, 1991, 22 y.o.   MRN: [id number]  HPI  Dysuria, bladder pain x 1 day. + frequency. Denies associated fever. Denis blood in urine, some blood with wiping but had period until yesterday. She denies low back pain.     Review of Systems See HPI  Past Medical History  Diagnosis Date  . Anxiety   . History of chicken pox   . GERD (gastroesophageal reflux disease)     History   Social History  . Marital Status: Single    Spouse Name: N/A    Number of Children: N/A  . Years of Education: N/A   Occupational History  . Not on file.   Social History Main Topics  . Smoking status: Never Smoker   . Smokeless tobacco: Never Used  . Alcohol Use: No     Comment: Occ Beer,Alcohol, stopped drinking April 11th, due to increased panic attacks. Drank excessively after her father committed suicide   . Drug Use: No  . Sexual Activity: Yes    Partners: Male    Birth Control/ Protection: Condom, Pill   Other Topics Concern  . 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 Wisdom tooth extraction  04/2012    Family History  Problem Relation Age of Onset  . Hyperlipidemia Mother   . Stroke Maternal Grandfather   . Depression Father   . Alcohol abuse Father   . Suicidality Father     No Known Allergies  Current Outpatient Prescriptions on File Prior to Visit  Medication Sig Dispense Refill  . norethindrone-ethinyl estradiol (OVCON-35,BALZIVA,BRIELLYN) 0.4-35 MG-MCG tablet Take 1 tablet by mouth daily.  1 Package  0  . propranolol (INDERAL) 10 MG tablet Take 1 tablet (10 mg total) by mouth daily.  30 tablet  0  . sertraline (ZOLOFT) 100 MG tablet Take 50 mg by mouth daily.       No current  facility-administered medications on file prior to visit.    BP 120/70  Pulse 57  Temp(Src) 98.2 F (36.8 C) (Oral)  Resp 16  Wt 181 lb 1.3 oz (82.137 kg)  BMI 28.35 kg/m2  SpO2 99%  LMP 05/16/2013       Objective:   Physical Exam  Constitutional: She appears well-developed and well-nourished. No distress.  Cardiovascular: Normal rate and regular rhythm.   No murmur heard. Pulmonary/Chest: Effort normal and breath sounds normal. No respiratory distress. She has no wheezes. She has no rales. She exhibits no tenderness.  Abdominal: Soft. There is no tenderness.  Genitourinary:  Neg CVAT bilaterally  Neurological: She is alert.          Assessment & Plan:

## 2013-06-27 ENCOUNTER — Other Ambulatory Visit: Payer: 59

## 2013-06-27 ENCOUNTER — Telehealth: Payer: Self-pay | Admitting: Obstetrics & Gynecology

## 2013-06-27 ENCOUNTER — Encounter: Payer: Self-pay | Admitting: Obstetrics & Gynecology

## 2013-06-27 NOTE — Telephone Encounter (Signed)
Patient dnka TSH appointment today. I was unable to leave a message on patients voice mail. I mailed the patient a letter.

## 2013-06-28 LAB — URINE CULTURE

## 2013-06-29 ENCOUNTER — Encounter: Payer: Self-pay | Admitting: Family

## 2013-07-18 ENCOUNTER — Ambulatory Visit (HOSPITAL_COMMUNITY): Payer: Self-pay | Admitting: Psychiatry

## 2013-07-20 ENCOUNTER — Ambulatory Visit (HOSPITAL_COMMUNITY): Payer: Self-pay | Admitting: Psychiatry

## 2013-07-23 ENCOUNTER — Encounter (HOSPITAL_COMMUNITY): Payer: Self-pay | Admitting: Psychiatry

## 2013-07-23 ENCOUNTER — Ambulatory Visit (INDEPENDENT_AMBULATORY_CARE_PROVIDER_SITE_OTHER): Payer: 59 | Admitting: Psychiatry

## 2013-07-23 VITALS — BP 122/58 | HR 80 | Ht 68.0 in | Wt 182.2 lb

## 2013-07-23 DIAGNOSIS — F410 Panic disorder [episodic paroxysmal anxiety] without agoraphobia: Secondary | ICD-10-CM

## 2013-07-23 DIAGNOSIS — F411 Generalized anxiety disorder: Secondary | ICD-10-CM

## 2013-07-23 MED ORDER — PROPRANOLOL HCL 10 MG PO TABS: 10.0000 mg | ORAL_TABLET | Freq: Every day | ORAL | Status: DC

## 2013-07-23 MED ORDER — SERTRALINE HCL 100 MG PO TABS: 100.0000 mg | ORAL_TABLET | Freq: Every day | ORAL | Status: DC

## 2013-07-23 NOTE — Progress Notes (Addendum)
Cynthia Robertson [id number] Progress Note  Cynthia Robertson [id number] 22 y.o.  07/23/2013 4:47 PM  Chief Complaint: Medication management and followup.    History of Present Illness: Patient is 22 year old Caucasian female who came for her followup appointment.  Patient was last seen in June .  Patient told she was very busy at work.  She got promotion at work.  She has not seeing therapist.  She is taking Zoloft 50 mg because she has no longer prescription of 100 mg.  She is taking Inderal 10 mg daily.  She denies any recent panic attack, irritability, crying spells, insomnia or any chest pain.  She likes her current medication.  Her appetite and sleep is unchanged from the past.  She is not drinking or using any illegal substance.  However she wants to see therapist again since she felt much improvement with counseling.  She has no tremors or shakes to the medication.  Suicidal Ideation: No Plan Formed: No Patient has means to carry out plan: No  Homicidal Ideation: No Plan Formed: No Patient has means to carry out plan: No  Review of Systems  HENT: Negative.   Respiratory: Negative.   Musculoskeletal: Negative.   Skin: Negative.   Neurological: Negative.     Psychiatric: Agitation: No Hallucination: No Depressed Mood: No Insomnia: No Hypersomnia: No Altered Concentration: No Feels Worthless: No Grandiose Ideas: No Belief In Special Powers: No New/Increased Substance Abuse: No Compulsions: No  Neurologic: Headache: No Seizure: No Paresthesias: No  Medical History: Patient is diagnosed with sinus tachycardia and acid reflux disease.  Psychosocial history. This was born in [place].  Her.  Assertive divorced when she was only 22 years old.  She denies any history of physical sexual or verbal abuse.  She is single and living with her mother and boyfriend.  She has no children.  Alcohol and substance use history. Patient admitted drinking in March to see  her to help anxiety and panic attack however claims to be sober since release from the hospital.  Patient has a history of binge drinking, DWI or any withdrawal symptoms.  Education and work history. Patient has an associate degree in business at the [employer].  She's working as a Optician, dispensing for 16 months.  Outpatient Encounter Prescriptions as of 07/23/2013  Medication Sig Dispense Refill  . norethindrone-ethinyl estradiol (OVCON-35,BALZIVA,BRIELLYN) 0.4-35 MG-MCG tablet Take 1 tablet by mouth daily.  1 Package  0  . [DISCONTINUED] propranolol (INDERAL) 10 MG tablet Take 1 tablet (10 mg total) by mouth daily.  30 tablet  0  . [DISCONTINUED] sertraline (ZOLOFT) 100 MG tablet Take 50 mg by mouth daily.      . propranolol (INDERAL) 10 MG tablet Take 1 tablet (10 mg total) by mouth daily.  30 tablet  2  . sertraline (ZOLOFT) 100 MG tablet Take 1 tablet (100 mg total) by mouth daily.  30 tablet  2  . [DISCONTINUED] ciprofloxacin (CIPRO) 500 MG tablet Take 1 tablet (500 mg total) by mouth 2 (two) times daily.  6 tablet  0   No facility-administered encounter medications on file as of 07/23/2013.    Past Psychiatric History/Hospitalization(s): Patient has one psychiatric admission to behavioral [facility] due to significant anxiety and passive suicidal thinking. She denies any history of paranoia, hallucination, mania, aggression, violence, suicidal at times. She has never seen a psychiatrist before psychiatric inpatient. She tried Celexa, Lexapro from primary care physician .  She also took Xanax in the  past from primary care physician but stopped because it was not working.   Anxiety: Yes Bipolar Disorder: No Depression: Yes Mania: No Psychosis: No Schizophrenia: No Personality Disorder: No Hospitalization for psychiatric illness: Yes History of Electroconvulsive Shock Therapy: No Prior Suicide Attempts: No  Physical Exam: Constitutional:  BP 122/58  Pulse 80  Ht 5\' 8"   (1.727 m)  Wt 182 lb 3.2 oz (82.645 kg)  BMI 27.71 kg/m2  LMP 05/16/2013  General Appearance: alert, oriented, no acute distress and well nourished  Musculoskeletal: Strength & Muscle Tone: within normal limits Gait & Station: normal Patient leans: N/A  Psychiatric: Speech (describe rate, volume, coherence, spontaneity, and abnormalities if any): Soft clear and coherent with normal tone volume.  Thought Process (describe rate, content, abstract reasoning, and computation): Logical organized goal-directed.  Associations: Coherent, Relevant and Intact  Thoughts: normal  Mental Status: Orientation: oriented to person, place, time/date and situation Mood & Affect: anxiety Attention Span & Concentration: Good  Medical Decision Making (Choose Three): Established Problem, Stable/Improving (1), Review or order clinical lab tests (1), Review of Last Therapy Session (1) and Review of New Medication or Change in Dosage (2)  Assessment: Axis I: Generalized anxiety disorder , panic disorder without agoraphobia .    Axis II: Deferred  Axis III:  Patient Active Problem List   Diagnosis Date Noted  . UTI (urinary tract infection) 06/26/2013  . Sinusitis, acute 05/29/2013  . Routine general medical examination at a [facility] facility 03/13/2013  . Abscess of breast 03/13/2013  . Panic disorder without agoraphobia 03/06/2013  . GERD (gastroesophageal reflux disease) 02/14/2013  . Sinus tachycardia 02/12/2013  . Generalized anxiety disorder 01/22/2013    Axis IV: Mild to moderate  Axis V: 70-75   Plan:  I recommended to go back on Zoloft 100 mg sense 50 mg it is a small dose and she may have relapsed into her depression and anxiety symptoms.  She requires a new prescription of Inderal 10 mg.  Recommend to see Cynthia [facility] for counseling.  We will provide a new prescription of Zoloft and Inderal.  Follow up in 3 months.  Recommend to call us back if she has any question or any concern.     ARFEEN,SYED T., MD 07/23/2013

## 2013-08-16 ENCOUNTER — Other Ambulatory Visit: Payer: Self-pay

## 2013-08-20 ENCOUNTER — Encounter (HOSPITAL_COMMUNITY): Payer: Self-pay | Admitting: Licensed Clinical Social Worker

## 2013-08-20 ENCOUNTER — Ambulatory Visit (HOSPITAL_COMMUNITY): Payer: Self-pay | Admitting: Licensed Clinical Social Worker

## 2013-08-20 NOTE — Progress Notes (Signed)
Patient ID: Cynthia [facility], female   DOB: 1991/09/16, 22 y.o.   MRN: [id number] Patient cancelled late.

## 2013-10-14 ENCOUNTER — Other Ambulatory Visit (HOSPITAL_COMMUNITY): Payer: Self-pay | Admitting: Psychiatry

## 2013-10-15 NOTE — Telephone Encounter (Signed)
Chart reviewed, refill not appropriate at this time. Will be refilled at appointment 10/23/13.

## 2013-10-22 ENCOUNTER — Encounter: Payer: Self-pay | Admitting: Family

## 2013-10-22 ENCOUNTER — Ambulatory Visit (INDEPENDENT_AMBULATORY_CARE_PROVIDER_SITE_OTHER): Payer: 59 | Admitting: Family

## 2013-10-22 VITALS — BP 110/64 | HR 53 | Temp 98.0°F | Resp 18 | Ht 67.0 in | Wt 178.0 lb

## 2013-10-22 DIAGNOSIS — H6691 Otitis media, unspecified, right ear: Secondary | ICD-10-CM

## 2013-10-22 DIAGNOSIS — H6690 Otitis media, unspecified, unspecified ear: Secondary | ICD-10-CM

## 2013-10-22 MED ORDER — AMOXICILLIN 500 MG PO CAPS: 500.0000 mg | ORAL_CAPSULE | Freq: Three times a day (TID) | ORAL | Status: DC

## 2013-10-22 NOTE — Progress Notes (Signed)
Pre visit review using our clinic review tool, if applicable. No additional management support is needed unless otherwise documented below in the visit note. 

## 2013-10-22 NOTE — Patient Instructions (Signed)
Add Claritin D for headache and congestion. Start amoxicillin.  Use back up birth control as amoxicillin may interfere with your birth control pill. Call if symptoms worsen or if not improved in 2-3 days.

## 2013-10-22 NOTE — Progress Notes (Signed)
   Subjective:    Patient ID: Cynthia [facility], female    DOB: 02-21-1991, 23 y.o.   MRN: [id number]  HPI  Cynthia Robertson is a 23 yr old female who presents today with c/o 4 day hx eye pain and headache. Reports + AM but this improves as the day goes on.  Denies associated fever. Has not tried any otc meds.  Declines flu shot.    Review of Systems See HPI  Past Medical History  Diagnosis Date  . Anxiety   . History of chicken pox   . GERD (gastroesophageal reflux disease)     History   Social History  . Marital Status: Single    Spouse Name: N/A    Number of Children: N/A  . Years of Education: N/A   Occupational History  . Not on file.   Social History Main Topics  . Smoking status: Never Smoker   . Smokeless tobacco: Never Used  . Alcohol Use: No     Comment: Occ Beer,Alcohol, stopped drinking April 11th, due to increased panic attacks. Drank excessively after her father committed suicide   . Drug Use: No  . Sexual Activity: Yes    Partners: Male    Birth Control/ Protection: Condom, Pill   Other Topics Concern  . 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 Wisdom tooth extraction  04/2012    Family History  Problem Relation Age of Onset  . Hyperlipidemia Mother   . Stroke Maternal Grandfather   . Depression Father   . Alcohol abuse Father   . Suicidality Father     No Known Allergies  Current Outpatient Prescriptions on File Prior to Visit  Medication Sig Dispense Refill  . norethindrone-ethinyl estradiol (OVCON-35,BALZIVA,BRIELLYN) 0.4-35 MG-MCG tablet Take 1 tablet by mouth daily.  1 Package  0  . propranolol (INDERAL) 10 MG tablet Take 1 tablet (10 mg total) by mouth daily.  30 tablet  2   No current facility-administered medications on file  prior to visit.    BP 110/64  Pulse 53  Temp(Src) 98 F (36.7 C) (Oral)  Resp 18  Ht 5\' 7"  (1.702 m)  Wt 178 lb (80.74 kg)  BMI 27.87 kg/m2  SpO2 98%       Objective:   Physical Exam  Constitutional: She is oriented to person, place, and time. She appears well-developed and well-nourished. No distress.  HENT:  Head: Normocephalic and atraumatic.  Right Ear: Tympanic membrane is erythematous and bulging.  Left Ear: Tympanic membrane and ear canal normal.  Mouth/Throat: No oropharyngeal exudate, posterior oropharyngeal edema or posterior oropharyngeal erythema.  Cardiovascular: Normal rate and regular rhythm.   No murmur heard. Pulmonary/Chest: Effort normal and breath sounds normal. No respiratory distress. She has no wheezes. She has no rales. She exhibits no tenderness.  Neurological: She is alert and oriented to person, place, and time.  Psychiatric: She has a normal mood and affect. Her behavior is normal. Judgment and thought content normal.          Assessment & Plan:

## 2013-10-22 NOTE — Assessment & Plan Note (Signed)
Rx with amoxicillin. Recommended condoms for back up birth control due to potential interaction with OCP.

## 2013-10-23 ENCOUNTER — Encounter (HOSPITAL_COMMUNITY): Payer: Self-pay | Admitting: Psychiatry

## 2013-10-23 ENCOUNTER — Ambulatory Visit (INDEPENDENT_AMBULATORY_CARE_PROVIDER_SITE_OTHER): Payer: 59 | Admitting: Psychiatry

## 2013-10-23 VITALS — BP 118/58 | HR 60 | Ht 67.0 in | Wt 178.2 lb

## 2013-10-23 DIAGNOSIS — F410 Panic disorder [episodic paroxysmal anxiety] without agoraphobia: Secondary | ICD-10-CM

## 2013-10-23 DIAGNOSIS — F411 Generalized anxiety disorder: Secondary | ICD-10-CM

## 2013-10-23 MED ORDER — PROPRANOLOL HCL 10 MG PO TABS: 10.0000 mg | ORAL_TABLET | Freq: Every day | ORAL | Status: DC

## 2013-10-23 MED ORDER — SERTRALINE HCL 50 MG PO TABS: ORAL_TABLET | ORAL | Status: DC

## 2013-10-23 NOTE — Progress Notes (Signed)
[facility] [phone] Progress Note  Cynthia [facility] [id number] 23 y.o.  10/23/2013 4:18 PM  Chief Complaint: Medication management and followup.    History of Present Illness: Cynthia [facility] came for her followup appointment.  She is taking Zoloft and Inderal.  Despite recommendation she did not tried Zoloft 100 mg and continued 50 mg.  Patient admitted that she does not want to take higher dose however realized that her anxiety is getting worse.  She is in the process of buying a house in [place].  She is very anxious about moving.  She is also very busy at work .  She got promoted but also she has more responsibility.  She feels anxious and nervous but denies any significant panic attack.  She denies any side effects of medication.  She is sleeping okay.  She is now drinking or using any illegal substances.  She has not seen therapists in a while because she has been busy at work.  She denied any crying spells, irritability or anger but admitted sometime feeling very anxious and nervous.  She recently given antibiotic for infection.  She is hoping to close the house next month.  She is living with her mother and boyfriend.  Suicidal Ideation: No Plan Formed: No Patient has means to carry out plan: No  Homicidal Ideation: No Plan Formed: No Patient has means to carry out plan: No  ROS  Psychiatric: Agitation: No Hallucination: No Depressed Mood: No Insomnia: No Hypersomnia: No Altered Concentration: No Feels Worthless: No Grandiose Ideas: No Belief In Special Powers: No New/Increased Substance Abuse: No Compulsions: No  Neurologic: Headache: No Seizure: No Paresthesias: No  Medical History: Patient is diagnosed with sinus tachycardia and acid reflux disease.  Outpatient Encounter Prescriptions as of 10/23/2013  Medication Sig  . amoxicillin (AMOXIL) 500 MG capsule Take 1 capsule (500 mg total) by mouth 3 (three) times daily.  . norethindrone-ethinyl estradiol  (OVCON-35,BALZIVA,BRIELLYN) 0.4-35 MG-MCG tablet Take 1 tablet by mouth daily.  . [DISCONTINUED] propranolol (INDERAL) 10 MG tablet Take 1 tablet (10 mg total) by mouth daily.  . [DISCONTINUED] sertraline (ZOLOFT) 100 MG tablet Take 50 mg by mouth daily.  . propranolol (INDERAL) 10 MG tablet Take 1 tablet (10 mg total) by mouth daily.  . sertraline (ZOLOFT) 50 MG tablet Take 1 and /1/2    Past Psychiatric History/Hospitalization(s): Patient has one psychiatric admission to behavioral [facility] due to significant anxiety and passive suicidal thinking. She denies any history of paranoia, hallucination, mania, aggression, violence, suicidal at times. She has never seen a psychiatrist before psychiatric inpatient. She tried Celexa, Lexapro from primary care physician .  She also took Xanax in the past from primary care physician but stopped because it was not working.   Anxiety: Yes Bipolar Disorder: No Depression: Yes Mania: No Psychosis: No Schizophrenia: No Personality Disorder: No Hospitalization for psychiatric illness: Yes History of Electroconvulsive Shock Therapy: No Prior Suicide Attempts: No  Physical Exam: Constitutional:  BP 118/58  Pulse 60  Ht 5\' 7"  (1.702 m)  Wt 178 lb 3.2 oz (80.831 kg)  BMI 27.90 kg/m2  General Appearance: alert, oriented, no acute distress and well nourished  Musculoskeletal: Strength & Muscle Tone: within normal limits Gait & Station: normal Patient leans: N/A  Psychiatric: Speech (describe rate, volume, coherence, spontaneity, and abnormalities if any): Soft clear and coherent with normal tone volume.  Thought Process (describe rate, content, abstract reasoning, and computation): Logical organized goal-directed.  Associations: Coherent, Relevant and Intact  Thoughts:  normal  Mental Status: Orientation: oriented to person, place, time/date and situation Mood & Affect: anxiety Attention Span & Concentration: Good  Medical Decision  Making (Choose Three): Established Problem, Stable/Improving (1), Review or order clinical lab tests (1), Review of Last Therapy Session (1) and Review of New Medication or Change in Dosage (2)  Assessment: Axis I: Generalized anxiety disorder , panic disorder without agoraphobia .    Axis II: Deferred  Axis III:  Patient Active Problem List   Diagnosis Date Noted  . Right otitis media 10/22/2013  . UTI (urinary tract infection) 06/26/2013  . Sinusitis, acute 05/29/2013  . Routine general medical examination at a health care facility 03/13/2013  . Abscess of breast 03/13/2013  . Panic disorder without agoraphobia 03/06/2013  . GERD (gastroesophageal reflux disease) 02/14/2013  . Sinus tachycardia 02/12/2013  . Generalized anxiety disorder 01/22/2013    Axis IV: Mild to moderate  Axis V: 70-75   Plan:  I recommended to try a 75 mg Zoloft if she is not like to take 100 mg.  Continue Inderal as prescribed.  Recommend to call us back if she has any question or any concern.  Followup in 3 months.  Cynthia [facility] T., MD 10/23/2013

## 2014-01-09 ENCOUNTER — Telehealth: Payer: Self-pay | Admitting: Obstetrics & Gynecology

## 2014-01-09 NOTE — Telephone Encounter (Signed)
Patient is requesting a refill of birth control until her appointment 01/29/14 @ 2:45 with Dr.Miller. [facility],  [place]

## 2014-01-10 MED ORDER — NORETHINDRONE-ETH ESTRADIOL 0.4-35 MG-MCG PO TABS: 1.0000 | ORAL_TABLET | Freq: Every day | ORAL | Status: DC

## 2014-01-10 NOTE — Telephone Encounter (Signed)
Last AEX 01/22/2013  Last refill 01/24/2013 #3 packs/ 3 refills. Next appt 01/29/2014.  Will refill once until appt 01/29/2014.  - patient notified.

## 2014-01-14 ENCOUNTER — Telehealth: Payer: Self-pay | Admitting: Obstetrics & Gynecology

## 2014-01-14 MED ORDER — NORGESTIMATE-ETH ESTRADIOL 0.25-35 MG-MCG PO TABS: 1.0000 | ORAL_TABLET | Freq: Every day | ORAL | Status: DC

## 2014-01-14 NOTE — Telephone Encounter (Signed)
Pt says the wrong birth control was called into the pharmacy. Sprintec 28 tabs should have been called into [facility] at [phone].

## 2014-01-14 NOTE — Telephone Encounter (Addendum)
Unable to contact pharmacy. - Sprintec Rx sent electronically. - Patient notified.

## 2014-01-15 ENCOUNTER — Other Ambulatory Visit (HOSPITAL_COMMUNITY): Payer: Self-pay | Admitting: *Deleted

## 2014-01-15 DIAGNOSIS — F411 Generalized anxiety disorder: Secondary | ICD-10-CM

## 2014-01-15 MED ORDER — PROPRANOLOL HCL 10 MG PO TABS: 10.0000 mg | ORAL_TABLET | Freq: Every day | ORAL | Status: DC

## 2014-01-15 NOTE — Telephone Encounter (Signed)
Patient left VM requesting refill of Propanolol. Contacted patient.Advised pt that as prescription was sent 1/13 with 2 refills and appt is 4/13, medicine should last until appt.Pt states her propanolol will run out this Thursday and appt is not unitl Monday.30 day supply will be sent to pharmacy so that pt may have continuation of therapy.

## 2014-01-21 ENCOUNTER — Ambulatory Visit (HOSPITAL_COMMUNITY): Payer: Self-pay | Admitting: Psychiatry

## 2014-01-23 NOTE — Telephone Encounter (Signed)
Opened in error

## 2014-01-28 ENCOUNTER — Ambulatory Visit (HOSPITAL_COMMUNITY): Payer: Self-pay | Admitting: Psychiatry

## 2014-01-29 ENCOUNTER — Ambulatory Visit: Payer: 59 | Admitting: Obstetrics & Gynecology

## 2014-01-29 ENCOUNTER — Ambulatory Visit: Payer: 59 | Admitting: Obstetrics and Gynecology

## 2014-02-05 ENCOUNTER — Ambulatory Visit (INDEPENDENT_AMBULATORY_CARE_PROVIDER_SITE_OTHER): Payer: 59 | Admitting: Psychiatry

## 2014-02-05 ENCOUNTER — Encounter (HOSPITAL_COMMUNITY): Payer: Self-pay | Admitting: Psychiatry

## 2014-02-05 VITALS — BP 111/75 | HR 57 | Ht 68.0 in | Wt 182.2 lb

## 2014-02-05 DIAGNOSIS — F410 Panic disorder [episodic paroxysmal anxiety] without agoraphobia: Secondary | ICD-10-CM

## 2014-02-05 DIAGNOSIS — F411 Generalized anxiety disorder: Secondary | ICD-10-CM

## 2014-02-05 MED ORDER — SERTRALINE HCL 50 MG PO TABS: ORAL_TABLET | ORAL | Status: DC

## 2014-02-05 MED ORDER — PROPRANOLOL HCL 10 MG PO TABS: 10.0000 mg | ORAL_TABLET | Freq: Every day | ORAL | Status: DC

## 2014-02-05 NOTE — Progress Notes (Signed)
[facility] [phone] Progress Note  Cynthia [facility] [id number] 23 y.o.  02/05/2014 11:29 AM  Chief Complaint: Medication management and followup.    History of Present Illness: Cynthia [facility] came for her followup appointment.  She is taking Zoloft 75 mg and feeling much better.  She is also taking Inderal.  She denies any recent panic attack or nervousness.  She moved to her new house in February .  She admitted feeling much better .  She is living with her boyfriend.  She continues to see her mother twice a week .  Her job is going very well.  She denies any tremors or shakes.  She is not drinking or using any illegal substances.  She denies any crying spells or any feeling of hopelessness or worthlessness.  She was to continue Zoloft and Inderal.  Her weight and appetite is unchanged from the past.  Suicidal Ideation: No Plan Formed: No Patient has means to carry out plan: No  Homicidal Ideation: No Plan Formed: No Patient has means to carry out plan: No  ROS  Psychiatric: Agitation: No Hallucination: No Depressed Mood: No Insomnia: No Hypersomnia: No Altered Concentration: No Feels Worthless: No Grandiose Ideas: No Belief In Special Powers: No New/Increased Substance Abuse: No Compulsions: No  Neurologic: Headache: No Seizure: No Paresthesias: No  Medical History: Patient is diagnosed with sinus tachycardia and acid reflux disease.  Outpatient Encounter Prescriptions as of 02/05/2014  Medication Sig  . norethindrone-ethinyl estradiol (OVCON-35,BALZIVA,BRIELLYN) 0.4-35 MG-MCG tablet Take 1 tablet by mouth daily.  . norgestimate-ethinyl estradiol (SPRINTEC 28) 0.25-35 MG-MCG tablet Take 1 tablet by mouth daily.  . propranolol (INDERAL) 10 MG tablet Take 1 tablet (10 mg total) by mouth daily.  . sertraline (ZOLOFT) 50 MG tablet Take 1 and /1/2  . [DISCONTINUED] amoxicillin (AMOXIL) 500 MG capsule Take 1 capsule (500 mg total) by mouth 3 (three) times daily.  .  [DISCONTINUED] propranolol (INDERAL) 10 MG tablet Take 1 tablet (10 mg total) by mouth daily.  . [DISCONTINUED] sertraline (ZOLOFT) 50 MG tablet Take 1 and /1/2    Past Psychiatric History/Hospitalization(s): Patient has one psychiatric admission to behavioral [place] due to significant anxiety and passive suicidal thinking. She denies any history of paranoia, hallucination, mania, aggression, violence, suicidal at times. She has never seen a psychiatrist before psychiatric inpatient. She tried Celexa, Lexapro from primary care physician .  She also took Xanax in the past from primary care physician but stopped because it was not working.   Anxiety: Yes Bipolar Disorder: No Depression: Yes Mania: No Psychosis: No Schizophrenia: No Personality Disorder: No Hospitalization for psychiatric illness: Yes History of Electroconvulsive Shock Therapy: No Prior Suicide Attempts: No  Physical Exam: Constitutional:  BP 111/75  Pulse 57  Ht 5\' 8"  (1.727 m)  Wt 182 lb 3.2 oz (82.645 kg)  BMI 27.71 kg/m2  General Appearance: alert, oriented, no acute distress and well nourished  Musculoskeletal: Strength & Muscle Tone: within normal limits Gait & Station: normal Patient leans: N/A  Psychiatric: Speech (describe rate, volume, coherence, spontaneity, and abnormalities if any): Soft clear and coherent with normal tone volume.  Thought Process (describe rate, content, abstract reasoning, and computation): Logical organized goal-directed.  Associations: Coherent, Relevant and Intact  Thoughts: normal  Mental Status: Orientation: oriented to person, place, time/date and situation Mood & Affect: anxiety Attention Span & Concentration: Good  Established Problem, Stable/Improving (1), Review of Last Therapy Session (1) and Review of New Medication or Change in Dosage (2)  Assessment: Axis I: Generalized anxiety disorder , panic disorder without agoraphobia .    Axis II:  Deferred  Axis III:  Patient Active Problem List   Diagnosis Date Noted  . Right otitis media 10/22/2013  . UTI (urinary tract infection) 06/26/2013  . Sinusitis, acute 05/29/2013  . Routine general medical examination at a health care facility 03/13/2013  . Abscess of breast 03/13/2013  . Panic disorder without agoraphobia 03/06/2013  . GERD (gastroesophageal reflux disease) 02/14/2013  . Sinus tachycardia 02/12/2013  . Generalized anxiety disorder 01/22/2013    Axis IV: Mild to moderate  Axis V: 70-75   Plan:  Patient is doing better on her current dose of Zoloft and Inderal.  I will continue Zoloft 75 mg daily and Inderal 10 mg daily.  Recommend to call us back if she has any question or any concern.  Followup in 3 months.  Charitie Hinote T., MD 02/05/2014

## 2014-02-08 ENCOUNTER — Encounter: Payer: Self-pay | Admitting: Obstetrics & Gynecology

## 2014-02-08 ENCOUNTER — Ambulatory Visit (INDEPENDENT_AMBULATORY_CARE_PROVIDER_SITE_OTHER): Payer: 59 | Admitting: Obstetrics & Gynecology

## 2014-02-08 VITALS — BP 112/58 | HR 58 | Resp 16 | Ht 68.0 in | Wt 182.6 lb

## 2014-02-08 DIAGNOSIS — Z01419 Encounter for gynecological examination (general) (routine) without abnormal findings: Secondary | ICD-10-CM

## 2014-02-08 DIAGNOSIS — Z0000 Encounter for general adult medical examination without abnormal findings: Secondary | ICD-10-CM

## 2014-02-08 LAB — POCT URINALYSIS DIPSTICK
Bilirubin, UA: NEGATIVE
Glucose, UA: NEGATIVE
Ketones, UA: NEGATIVE
Leukocytes, UA: NEGATIVE
Nitrite, UA: NEGATIVE
PH UA: 5
PROTEIN UA: NEGATIVE
RBC UA: NEGATIVE
UROBILINOGEN UA: NEGATIVE

## 2014-02-08 MED ORDER — NORGESTIMATE-ETH ESTRADIOL 0.25-35 MG-MCG PO TABS: 1.0000 | ORAL_TABLET | Freq: Every day | ORAL | Status: DC

## 2014-02-08 NOTE — Patient Instructions (Addendum)

## 2014-02-08 NOTE — Progress Notes (Signed)
23 y.o. G0P0000 SingleCaucasianF here for annual exam.  Doing well on OCPs.  Cycles are regular.  Flow lasts 4-6 days.  First day is heavy.  Minimal cramping.  Psychiatrist is Dr. Adele Schilder.  Saw him on April 28th.  On Inderal for palpitations and anxiety.  Doing well with this.  Also on Zoloft 50mg .  This works well for pt.  Together with boyfriend for four years.  Pt just bought a house.  Boyfriend lives with her.  Going on 14 day trip to [place], [place], including a [place] cruise, and then [place].  Patient's last menstrual period was 01/29/2014.          Sexually active: yes  The current method of family planning is OCP (estrogen/progesterone).    Exercising: no  not regularly Smoker:  no  Health Maintenance: Pap:  01/22/13-WNL/negative HR HPV History of abnormal Pap:  no MMG:  none Colonoscopy:  none BMD:   none TDaP:  Up to date-will check with PCP Screening Labs: n/a, Hb today: n/a, Urine today: negative   reports that she has never smoked. She has never used smokeless tobacco. She reports that she does not drink alcohol or use illicit drugs.  Past Medical History  Diagnosis Date  . Anxiety   . History of chicken pox   . GERD (gastroesophageal reflux disease)     Past Surgical History  Procedure Laterality Date  . Wisdom tooth extraction  04/2012    Current Outpatient Prescriptions  Medication Sig Dispense Refill  . norgestimate-ethinyl estradiol (SPRINTEC 28) 0.25-35 MG-MCG tablet Take 1 tablet by mouth daily.  1 Package  0  . propranolol (INDERAL) 10 MG tablet Take 1 tablet (10 mg total) by mouth daily.  30 tablet  2  . sertraline (ZOLOFT) 50 MG tablet Take 1 and /1/2  25 tablet  2   No current facility-administered medications for this visit.    Family History  Problem Relation Age of Onset  . Hyperlipidemia Mother   . Stroke Maternal Grandfather   . Depression Father   . Alcohol abuse Father   . Suicidality Father     ROS:  Pertinent items are  noted in HPI.  Otherwise, a comprehensive ROS was negative.  Exam:   BP 112/58  Pulse 58  Resp 16  Ht 5\' 8"  (1.727 m)  Wt 182 lb 9.6 oz (82.827 kg)  BMI 27.77 kg/m2  LMP 01/29/2014     Height: 5\' 8"  (172.7 cm)  Ht Readings from Last 3 Encounters:  02/08/14 5\' 8"  (1.727 m)  02/05/14 5\' 8"  (1.727 m)  10/23/13 5\' 7"  (1.702 m)    General appearance: alert, cooperative and appears stated age Head: Normocephalic, without obvious abnormality, atraumatic Neck: no adenopathy, supple, symmetrical, trachea midline and thyroid normal to inspection and palpation Lungs: clear to auscultation bilaterally Breasts: normal appearance, no masses or tenderness Heart: regular rate and rhythm Abdomen: soft, non-tender; bowel sounds normal; no masses,  no organomegaly Extremities: extremities normal, atraumatic, no cyanosis or edema Skin: Skin color, texture, turgor normal. No rashes or lesions Lymph nodes: Cervical, supraclavicular, and axillary nodes normal. No abnormal inguinal nodes palpated Neurologic: Grossly normal   Pelvic: External genitalia:  no lesions              Urethra:  normal appearing urethra with no masses, tenderness or lesions              Bartholins and Skenes: normal  Vagina: normal appearing vagina with normal color and discharge, no lesions              Cervix: no lesions              Pap taken: no Bimanual Exam:  Uterus:  normal size, contour, position, consistency, mobility, non-tender              Adnexa: normal adnexa and no mass, fullness, tenderness               Rectovaginal: Confirms               Anus:  normal sphincter tone, no lesions  A:  Well Woman with normal exam On OCPs for BC  P:   Mammogram starting age 23 pap smear not indicated.  Neg Pap with neg HR HPV 4/14.   Sprintec 28 rx to pharmacy for 90 day supply and RFs for one year. return annually or prn  An After Visit Summary was printed and given to the patient.

## 2014-02-11 NOTE — Addendum Note (Signed)
Addended by: Robley Fries on: 02/11/2014 09:35 AM   Modules accepted: Orders

## 2014-02-14 ENCOUNTER — Other Ambulatory Visit (HOSPITAL_COMMUNITY): Payer: Self-pay | Admitting: Psychiatry

## 2014-02-15 ENCOUNTER — Other Ambulatory Visit (HOSPITAL_COMMUNITY): Payer: Self-pay | Admitting: Psychiatry

## 2014-02-15 NOTE — Telephone Encounter (Signed)
Given on 02/05/14 with 2 refills.

## 2014-04-29 ENCOUNTER — Other Ambulatory Visit (HOSPITAL_COMMUNITY): Payer: Self-pay | Admitting: Psychiatry

## 2014-05-08 ENCOUNTER — Ambulatory Visit (HOSPITAL_COMMUNITY): Payer: Self-pay | Admitting: Psychiatry

## 2014-05-16 ENCOUNTER — Ambulatory Visit (HOSPITAL_COMMUNITY): Payer: Self-pay | Admitting: Psychiatry

## 2014-05-17 ENCOUNTER — Other Ambulatory Visit (HOSPITAL_COMMUNITY): Payer: Self-pay | Admitting: Psychiatry

## 2014-05-18 ENCOUNTER — Other Ambulatory Visit (HOSPITAL_COMMUNITY): Payer: Self-pay | Admitting: Psychiatry

## 2014-05-20 ENCOUNTER — Other Ambulatory Visit (HOSPITAL_COMMUNITY): Payer: Self-pay | Admitting: Psychiatry

## 2014-05-21 ENCOUNTER — Other Ambulatory Visit (HOSPITAL_COMMUNITY): Payer: Self-pay | Admitting: Psychiatry

## 2014-05-21 DIAGNOSIS — F411 Generalized anxiety disorder: Secondary | ICD-10-CM

## 2014-05-21 MED ORDER — PROPRANOLOL HCL 10 MG PO TABS: 10.0000 mg | ORAL_TABLET | Freq: Every day | ORAL | Status: DC

## 2014-05-27 ENCOUNTER — Telehealth: Payer: Self-pay | Admitting: Obstetrics & Gynecology

## 2014-05-27 NOTE — Telephone Encounter (Signed)
Patient requesting refill on: norgestimate-ethinyl estradiol (SPRINTEC 28) 0.25-35 MG-MCG tablet  Take 1 tablet by mouth daily., Starting 02/08/2014, Until Discontinued, Normal, Last Dose: Not Recorded    [facility]

## 2014-05-27 NOTE — Telephone Encounter (Signed)
Last refilled 02/08/14 #[facility] packs with 4 refills [facility] [place]) S/w patient and notified her that [facility] [place] Patient verbalized understanding and aware to call us if she has any problems.

## 2014-05-28 ENCOUNTER — Encounter (HOSPITAL_COMMUNITY): Payer: Self-pay | Admitting: Psychiatry

## 2014-05-28 ENCOUNTER — Ambulatory Visit (INDEPENDENT_AMBULATORY_CARE_PROVIDER_SITE_OTHER): Payer: 59 | Admitting: Psychiatry

## 2014-05-28 VITALS — BP 110/61 | HR 66 | Wt 184.0 lb

## 2014-05-28 DIAGNOSIS — F410 Panic disorder [episodic paroxysmal anxiety] without agoraphobia: Secondary | ICD-10-CM

## 2014-05-28 DIAGNOSIS — F411 Generalized anxiety disorder: Secondary | ICD-10-CM

## 2014-05-28 MED ORDER — PROPRANOLOL HCL 10 MG PO TABS: 10.0000 mg | ORAL_TABLET | Freq: Every day | ORAL | Status: DC

## 2014-05-28 MED ORDER — SERTRALINE HCL 50 MG PO TABS
ORAL_TABLET | ORAL | Status: DC
Start: 2014-05-28 — End: 2014-09-26

## 2014-05-28 NOTE — Progress Notes (Signed)
[facility] [phone] Progress Note  Cynthia Robertson [id number] 23 y.o.  05/28/2014 11:30 AM  Chief Complaint: Medication management and followup.    History of Present Illness: Cynthia Robertson came for her followup appointment.  She is taking her medication and denies any side effects.  She denies any chest pain, palpitations, dizziness or any major anxiety symptoms.  She was recently promoted at her job and she also got engaged .  She's been very busy at her work and also planning for her wedding in April of next year.  She is taking Zoloft 75 mg and Inderal 10 mg daily.   Her appetite is okay.  Her sleep is her vitals are stable.  She denies any agitation, anger or any mood swings.    Suicidal Ideation: No Plan Formed: No Patient has means to carry out plan: No  Homicidal Ideation: No Plan Formed: No Patient has means to carry out plan: No  ROS  Psychiatric: Agitation: No Hallucination: No Depressed Mood: No Insomnia: No Hypersomnia: No Altered Concentration: No Feels Worthless: No Grandiose Ideas: No Belief In Special Powers: No New/Increased Substance Abuse: No Compulsions: No  Neurologic: Headache: No Seizure: No Paresthesias: No  Medical History: Patient is diagnosed with sinus tachycardia and acid reflux disease.  Outpatient Encounter Prescriptions as of 05/28/2014  Medication Sig  . norgestimate-ethinyl estradiol (SPRINTEC 28) 0.25-35 MG-MCG tablet Take 1 tablet by mouth daily.  . propranolol (INDERAL) 10 MG tablet Take 1 tablet (10 mg total) by mouth daily.  . sertraline (ZOLOFT) 50 MG tablet Take 1 and 1/2 daily  . [DISCONTINUED] propranolol (INDERAL) 10 MG tablet Take 1 tablet (10 mg total) by mouth daily.  . [DISCONTINUED] sertraline (ZOLOFT) 50 MG tablet TAKE 1 AND 1/2 TABLET DAILY    Past Psychiatric History/Hospitalization(s): Patient has one psychiatric admission to behavioral [place] due to significant anxiety and passive suicidal thinking.  She denies any history of paranoia, hallucination, mania, aggression, violence, suicidal at times. She has never seen a psychiatrist before psychiatric inpatient. She tried Celexa, Lexapro from primary care physician .  She also took Xanax in the past from primary care physician but stopped because it was not working.   Anxiety: Yes Bipolar Disorder: No Depression: Yes Mania: No Psychosis: No Schizophrenia: No Personality Disorder: No Hospitalization for psychiatric illness: Yes History of Electroconvulsive Shock Therapy: No Prior Suicide Attempts: No  Physical Exam: Constitutional:  BP 110/61  Pulse 66  Wt 184 lb (83.462 kg)  General Appearance: alert, oriented, no acute distress and well nourished  Musculoskeletal: Strength & Muscle Tone: within normal limits Gait & Station: normal Patient leans: N/A  Psychiatric: Speech (describe rate, volume, coherence, spontaneity, and abnormalities if any): Soft clear and coherent with normal tone volume.  Thought Process (describe rate, content, abstract reasoning, and computation): Logical organized goal-directed.  Associations: Coherent, Relevant and Intact  Thoughts: normal  Mental Status: Orientation: oriented to person, place, time/date and situation Mood & Affect: anxiety Attention Span & Concentration: Good  Established Problem, Stable/Improving (1), Review of Last Therapy Session (1) and Review of New Medication or Change in Dosage (2)  Assessment: Axis I: Generalized anxiety disorder , panic disorder without agoraphobia .    Axis II: Deferred  Axis III:  Patient Active Problem List   Diagnosis Date Noted  . Right otitis media 10/22/2013  . UTI (urinary tract infection) 06/26/2013  . Sinusitis, acute 05/29/2013  . Routine general medical examination at a health care facility 03/13/2013  . Abscess  of breast 03/13/2013  . Panic disorder without agoraphobia 03/06/2013  . GERD (gastroesophageal reflux disease)  02/14/2013  . Sinus tachycardia 02/12/2013  . Generalized anxiety disorder 01/22/2013    Axis IV: Mild to moderate  Axis V: 70-75   Plan:   patient is a stable on her current medication.  I will continue Zoloft 75 mg daily and Inderal 10 mg daily.  Recommend to call us back if she has any question or any concern.  Followup in 3 months.  Matthias Bogus T., MD 05/28/2014

## 2014-06-03 ENCOUNTER — Telehealth: Payer: Self-pay | Admitting: *Deleted

## 2014-06-03 MED ORDER — NORGESTIMATE-ETH ESTRADIOL 0.25-35 MG-MCG PO TABS: 1.0000 | ORAL_TABLET | Freq: Every day | ORAL | Status: DC

## 2014-06-03 NOTE — Telephone Encounter (Signed)
Patient calling s/w her last week in regards to refill [facility] that [facility] [place] [facility]. (02/08/14 #3 packs with 4 refills) [facility] needing [facility] [place]. Told her that she could transfer her remaining refills to the [facility] in [place], patient was agreeable. [facility] and [facility]'t [facility] any refills for her. Would like [place] to send the remaining refills to [facility] in [place].  Sprintec #3 packs with 3 refills sent to [facility] in [place], patient is aware.  Routed to provider for review, encounter closed.

## 2014-08-28 ENCOUNTER — Ambulatory Visit (HOSPITAL_COMMUNITY): Payer: Self-pay | Admitting: Psychiatry

## 2014-09-18 ENCOUNTER — Other Ambulatory Visit (HOSPITAL_COMMUNITY): Payer: Self-pay | Admitting: Psychiatry

## 2014-09-20 ENCOUNTER — Telehealth (HOSPITAL_COMMUNITY): Payer: Self-pay | Admitting: *Deleted

## 2014-09-20 ENCOUNTER — Other Ambulatory Visit (HOSPITAL_COMMUNITY): Payer: Self-pay | Admitting: Psychiatry

## 2014-09-20 DIAGNOSIS — F411 Generalized anxiety disorder: Secondary | ICD-10-CM

## 2014-09-20 MED ORDER — PROPRANOLOL HCL 10 MG PO TABS: 10.0000 mg | ORAL_TABLET | Freq: Every day | ORAL | Status: DC

## 2014-09-25 ENCOUNTER — Ambulatory Visit (HOSPITAL_COMMUNITY): Payer: Self-pay | Admitting: Psychiatry

## 2014-09-26 ENCOUNTER — Telehealth (HOSPITAL_COMMUNITY): Payer: Self-pay

## 2014-09-26 ENCOUNTER — Other Ambulatory Visit (HOSPITAL_COMMUNITY): Payer: Self-pay | Admitting: Psychiatry

## 2014-09-26 DIAGNOSIS — F411 Generalized anxiety disorder: Secondary | ICD-10-CM

## 2014-09-26 MED ORDER — SERTRALINE HCL 50 MG PO TABS: ORAL_TABLET | ORAL | Status: DC

## 2014-09-26 MED ORDER — PROPRANOLOL HCL 10 MG PO TABS: 10.0000 mg | ORAL_TABLET | Freq: Every day | ORAL | Status: DC

## 2014-09-27 ENCOUNTER — Telehealth (HOSPITAL_COMMUNITY): Payer: Self-pay | Admitting: *Deleted

## 2014-09-27 NOTE — Telephone Encounter (Signed)
Pt called asking for refill of Inderal. Notified patient that medication had been sent to her pharmacy on 09/26/14. No further action required.

## 2014-10-01 ENCOUNTER — Other Ambulatory Visit (HOSPITAL_COMMUNITY): Payer: Self-pay | Admitting: *Deleted

## 2014-10-01 DIAGNOSIS — F411 Generalized anxiety disorder: Secondary | ICD-10-CM

## 2014-10-01 MED ORDER — PROPRANOLOL HCL 10 MG PO TABS: 10.0000 mg | ORAL_TABLET | Freq: Every day | ORAL | Status: DC

## 2014-10-01 NOTE — Telephone Encounter (Signed)
Pt called for refill request to last until appt on 10/15/2013

## 2014-10-15 ENCOUNTER — Ambulatory Visit (INDEPENDENT_AMBULATORY_CARE_PROVIDER_SITE_OTHER): Payer: 59 | Admitting: Psychiatry

## 2014-10-15 ENCOUNTER — Encounter (HOSPITAL_COMMUNITY): Payer: Self-pay | Admitting: Psychiatry

## 2014-10-15 VITALS — BP 117/79 | HR 90 | Ht 68.0 in | Wt 196.8 lb

## 2014-10-15 DIAGNOSIS — F411 Generalized anxiety disorder: Secondary | ICD-10-CM

## 2014-10-15 DIAGNOSIS — F410 Panic disorder [episodic paroxysmal anxiety] without agoraphobia: Secondary | ICD-10-CM

## 2014-10-15 MED ORDER — PROPRANOLOL HCL 10 MG PO TABS: 10.0000 mg | ORAL_TABLET | Freq: Every day | ORAL | Status: DC

## 2014-10-15 MED ORDER — SERTRALINE HCL 50 MG PO TABS: ORAL_TABLET | ORAL | Status: DC

## 2014-10-15 NOTE — Progress Notes (Signed)
[facility] [phone] Progress Note  Cynthia Robertson [id number] 24 y.o.  10/15/2014 11:51 AM  Chief Complaint: Medication management and followup.    History of Present Illness: Cynthia Robertson came for her followup appointment.  She has been very busy at her job.  She is traveling back and forth in [place].  She is taking her medication and denies any major panic attack.  She is anxious about getting married in April .  She is finalizing every small details.  She is hoping for a honeymoon in [place].  Patient denies any agitation, crying spells, chest palpitation or any depressive thoughts.  Her appetite is okay.  Her vitals are stable.  She is taking Zoloft 75 mg daily and Inderal 10 mg daily.    Suicidal Ideation: No Plan Formed: No Patient has means to carry out plan: No  Homicidal Ideation: No Plan Formed: No Patient has means to carry out plan: No  ROS  Psychiatric: Agitation: No Hallucination: No Depressed Mood: No Insomnia: No Hypersomnia: No Altered Concentration: No Feels Worthless: No Grandiose Ideas: No Belief In Special Powers: No New/Increased Substance Abuse: No Compulsions: No  Neurologic: Headache: No Seizure: No Paresthesias: No  Medical History: Patient is diagnosed with sinus tachycardia and acid reflux disease.  Outpatient Encounter Prescriptions as of 10/15/2014  Medication Sig  . norgestimate-ethinyl estradiol (SPRINTEC 28) 0.25-35 MG-MCG tablet Take 1 tablet by mouth daily.  . propranolol (INDERAL) 10 MG tablet Take 1 tablet (10 mg total) by mouth daily.  . sertraline (ZOLOFT) 50 MG tablet Take 1 and 1/2 daily  . [DISCONTINUED] propranolol (INDERAL) 10 MG tablet Take 1 tablet (10 mg total) by mouth daily.  . [DISCONTINUED] sertraline (ZOLOFT) 50 MG tablet Take 1 and 1/2 daily    Past Psychiatric History/Hospitalization(s): Patient has one psychiatric admission to behavioral [place] due to significant anxiety and passive suicidal thinking.  She denies any history of paranoia, hallucination, mania, aggression, violence, suicidal at times. She has never seen a psychiatrist before psychiatric inpatient. She tried Celexa, Lexapro from primary care physician .  She also took Xanax in the past from primary care physician but stopped because it was not working.   Anxiety: Yes Bipolar Disorder: No Depression: Yes Mania: No Psychosis: No Schizophrenia: No Personality Disorder: No Hospitalization for psychiatric illness: Yes History of Electroconvulsive Shock Therapy: No Prior Suicide Attempts: No  Physical Exam: Constitutional:  BP 117/79 mmHg  Pulse 90  Ht 5\' 8"  (1.727 m)  Wt 196 lb 12.8 oz (89.268 kg)  BMI 29.93 kg/m2  General Appearance: alert, oriented, no acute distress and well nourished  Musculoskeletal: Strength & Muscle Tone: within normal limits Gait & Station: normal Patient leans: N/A  Psychiatric: Speech (describe rate, volume, coherence, spontaneity, and abnormalities if any): Soft clear and coherent with normal tone volume.  Thought Process (describe rate, content, abstract reasoning, and computation): Logical organized goal-directed.  Associations: Coherent, Relevant and Intact  Thoughts: normal  Mental Status: Orientation: oriented to person, place, time/date and situation Mood & Affect: anxiety Attention Span & Concentration: Good  Established Problem, Stable/Improving (1), Review of Last Therapy Session (1) and Review of New Medication or Change in Dosage (2)  Assessment: Axis I: Generalized anxiety disorder , panic disorder without agoraphobia .    Axis II: Deferred  Axis III:  Patient Active Problem List   Diagnosis Date Noted  . Right otitis media 10/22/2013  . UTI (urinary tract infection) 06/26/2013  . Sinusitis, acute 05/29/2013  . Routine  general medical examination at a health care facility 03/13/2013  . Abscess of breast 03/13/2013  . Panic disorder without agoraphobia  03/06/2013  . GERD (gastroesophageal reflux disease) 02/14/2013  . Sinus tachycardia 02/12/2013  . Generalized anxiety disorder 01/22/2013    Axis IV: Mild to moderate  Axis V: 70-75   Plan:   patient is a stable on her current medication.  I will continue Zoloft 75 mg daily and Inderal 10 mg daily.  Recommend to call us back if she has any question or any concern.  Followup in 3 months.  Sri Clegg T., MD 10/15/2014

## 2014-10-29 ENCOUNTER — Ambulatory Visit: Payer: Self-pay | Admitting: Internal Medicine

## 2014-11-06 NOTE — Telephone Encounter (Signed)
Completed.

## 2014-11-20 ENCOUNTER — Encounter: Payer: Self-pay | Admitting: Nurse Practitioner

## 2014-11-20 ENCOUNTER — Telehealth: Payer: Self-pay | Admitting: Obstetrics & Gynecology

## 2014-11-20 ENCOUNTER — Ambulatory Visit (INDEPENDENT_AMBULATORY_CARE_PROVIDER_SITE_OTHER): Payer: 59 | Admitting: Nurse Practitioner

## 2014-11-20 VITALS — BP 110/58 | HR 60 | Temp 98.9°F | Ht 68.0 in | Wt 198.0 lb

## 2014-11-20 DIAGNOSIS — R300 Dysuria: Secondary | ICD-10-CM

## 2014-11-20 DIAGNOSIS — N760 Acute vaginitis: Secondary | ICD-10-CM

## 2014-11-20 LAB — POCT URINALYSIS DIPSTICK
Bilirubin, UA: NEGATIVE
GLUCOSE UA: NEGATIVE
KETONES UA: NEGATIVE
NITRITE UA: NEGATIVE
Urobilinogen, UA: NEGATIVE
pH, UA: 5

## 2014-11-20 NOTE — Telephone Encounter (Signed)
Spoke with patient. Patient states that she has been experiencing "chunky weird colored discharge" since yesterday. Patient is also having burning with urination. Denies lower back pain,fevers,or chills. "I have been reading online about STD's and have some concerns. I am engaged but I am freaking out that it could be one." Advised patient will need to be seen for evaluation of symptoms for treatment. Patient is agreeable. Appointment scheduled for today at 3:30pm with Milford Cage, [place]. Patient is agreeable to date and time.  Cc: Dr.Miller  Routing to provider for final review. Patient agreeable to disposition. Will close encounter

## 2014-11-20 NOTE — Progress Notes (Signed)
24 y.o.WS Fe G0 here with complaint of vaginal symptoms of itching, burning, and increase discharge. Describes discharge as thick chunky yellow to green. Onset of symptoms 2-3 days ago. Denies new personal products or vaginal dryness. No STD concerns. Urinary symptoms with discomfort with urine touching the vaginal area.  No recent antibiotic.  Getting married 01/11/15. On OCP.  Same partner for 4.5 years  Denies lower back pain,fevers,or chills. "I have been reading online about STD's and have some concerns. I am engaged but I am freaking out that it could be one".  O:Healthy female WDWN Affect: normal, orientation x 3  Exam: no distress Abdomen: soft and non tender Lymph node: no enlargement or tenderness Pelvic exam: External genital: no lesions BUS: negative Vagina: white discharge noted.  Cervix: normal, non tender Uterus: normal, non tender Adnexa:normal, non tender, no masses or fullness noted  Affirm is done urine C&S is done   A: R/O STD's  R/O BV, yeast, Trich   P:D discussed findings of vaginal discharge and etiology. Discussed Aveeno or baking soda sitz bath for comfort. Avoid moist clothes or pads for extended period of time. If working out in gym clothes or swim suits for long periods of time change underwear or bottoms of swimsuit if possible. Olive Oil use for skin protection prior to activity can be used to external skin.  Rx: will await test results  RV prn

## 2014-11-20 NOTE — Patient Instructions (Signed)
will call with results

## 2014-11-20 NOTE — Telephone Encounter (Signed)
Pt states shes been experiencing some 'weird discharge' for a few days now and requests same day appointment.

## 2014-11-21 ENCOUNTER — Other Ambulatory Visit: Payer: Self-pay | Admitting: Nurse Practitioner

## 2014-11-21 LAB — WET PREP BY MOLECULAR PROBE
Candida species: POSITIVE — AB
GARDNERELLA VAGINALIS: POSITIVE — AB
Trichomonas vaginosis: NEGATIVE

## 2014-11-21 MED ORDER — FLUCONAZOLE 150 MG PO TABS: 150.0000 mg | ORAL_TABLET | Freq: Once | ORAL | Status: DC

## 2014-11-21 MED ORDER — METRONIDAZOLE 0.75 % VA GEL: 1.0000 | Freq: Every day | VAGINAL | Status: DC

## 2014-11-22 LAB — URINE CULTURE
Colony Count: NO GROWTH
ORGANISM ID, BACTERIA: NO GROWTH

## 2014-11-22 NOTE — Progress Notes (Signed)
Reviewed personally.  M. Suzanne Keigan Tafoya, MD.  

## 2014-11-23 LAB — IPS N GONORRHOEA AND CHLAMYDIA BY PCR

## 2015-01-06 ENCOUNTER — Ambulatory Visit (HOSPITAL_COMMUNITY): Payer: Self-pay | Admitting: Psychiatry

## 2015-01-08 ENCOUNTER — Ambulatory Visit (HOSPITAL_COMMUNITY): Payer: Self-pay | Admitting: Psychiatry

## 2015-01-15 ENCOUNTER — Ambulatory Visit (HOSPITAL_COMMUNITY): Payer: Self-pay | Admitting: Psychiatry

## 2015-01-29 ENCOUNTER — Telehealth (HOSPITAL_COMMUNITY): Payer: Self-pay | Admitting: *Deleted

## 2015-01-29 DIAGNOSIS — F411 Generalized anxiety disorder: Secondary | ICD-10-CM

## 2015-01-29 NOTE — Telephone Encounter (Signed)
Dr. Adele Schilder,   Patient left message on nurse's voice mail.  Patient request refill on Inderal and Zoloft. Patient last seen 10-15-14.  Patient cancelled appointment on 01-15-15.  Patient due to follow up 02-25-15.  Is it okay to refill?

## 2015-01-30 MED ORDER — PROPRANOLOL HCL 10 MG PO TABS: 10.0000 mg | ORAL_TABLET | Freq: Every day | ORAL | Status: DC

## 2015-01-30 MED ORDER — SERTRALINE HCL 50 MG PO TABS: ORAL_TABLET | ORAL | Status: DC

## 2015-01-30 NOTE — Telephone Encounter (Signed)
Will do!

## 2015-02-03 ENCOUNTER — Other Ambulatory Visit (HOSPITAL_COMMUNITY): Payer: Self-pay | Admitting: Psychiatry

## 2015-02-03 NOTE — Telephone Encounter (Signed)
Given on 01/30/15. Too soon to refill.

## 2015-02-04 ENCOUNTER — Other Ambulatory Visit (HOSPITAL_COMMUNITY): Payer: Self-pay | Admitting: Psychiatry

## 2015-02-04 NOTE — Telephone Encounter (Signed)
Received refill request from [facility] for Propranolol.  Propranolol was sent 01-30-15--30 with zero refills.  I called [facility].  I was told refill request was from an old RX, disregard notice.

## 2015-02-14 ENCOUNTER — Ambulatory Visit: Payer: 59 | Admitting: Obstetrics & Gynecology

## 2015-02-14 ENCOUNTER — Encounter: Payer: Self-pay | Admitting: Obstetrics & Gynecology

## 2015-02-17 ENCOUNTER — Encounter: Payer: Self-pay | Admitting: Obstetrics & Gynecology

## 2015-02-25 ENCOUNTER — Ambulatory Visit (HOSPITAL_COMMUNITY): Payer: Self-pay | Admitting: Psychiatry

## 2015-02-27 ENCOUNTER — Ambulatory Visit (HOSPITAL_COMMUNITY): Payer: Self-pay | Admitting: Psychiatry

## 2015-03-11 ENCOUNTER — Telehealth (HOSPITAL_COMMUNITY): Payer: Self-pay | Admitting: *Deleted

## 2015-03-11 DIAGNOSIS — F411 Generalized anxiety disorder: Secondary | ICD-10-CM

## 2015-03-11 MED ORDER — PROPRANOLOL HCL 10 MG PO TABS: 10.0000 mg | ORAL_TABLET | Freq: Every day | ORAL | Status: DC

## 2015-03-11 NOTE — Telephone Encounter (Signed)
Patient called requesting refill on Propranolol.  Patient last seen 10-15-14.  In April patient had medication refilled but told to keep appointment on 02-27-15, she no showed that appointment.  Patient cancelled appointment on 03-13-15.  Patient rescheduled for 03-31-15.  Patient stated she is going to [place] on business trip June 5 and she will need her medication refilled before then.  Do you want to refill, please advise.

## 2015-03-11 NOTE — Telephone Encounter (Signed)
We will provide 30 day prescription until seen on June 20.

## 2015-03-13 ENCOUNTER — Ambulatory Visit (HOSPITAL_COMMUNITY): Payer: Self-pay | Admitting: Psychiatry

## 2015-03-14 ENCOUNTER — Telehealth (HOSPITAL_COMMUNITY): Payer: Self-pay

## 2015-03-14 NOTE — Telephone Encounter (Signed)
Telephone call from patient requesting a refill of her Propranolol.  Informed patient Dr. Adele Schilder e-scribed her in a new order on 03/11/15 to her [facility].  Will see at next evaluation on 03/31/15.Marland Kitchen

## 2015-03-21 ENCOUNTER — Ambulatory Visit: Payer: Self-pay | Admitting: Physician Assistant

## 2015-03-21 ENCOUNTER — Telehealth: Payer: Self-pay | Admitting: Family

## 2015-03-21 DIAGNOSIS — Z0289 Encounter for other administrative examinations: Secondary | ICD-10-CM

## 2015-03-21 NOTE — Telephone Encounter (Signed)
Pt left voicemail canceling her appointment, pt went to urgent care.

## 2015-03-31 ENCOUNTER — Encounter (HOSPITAL_COMMUNITY): Payer: Self-pay | Admitting: Psychiatry

## 2015-03-31 ENCOUNTER — Ambulatory Visit (INDEPENDENT_AMBULATORY_CARE_PROVIDER_SITE_OTHER): Payer: 59 | Admitting: Psychiatry

## 2015-03-31 VITALS — BP 122/78 | HR 79 | Ht 68.25 in | Wt 205.2 lb

## 2015-03-31 DIAGNOSIS — F410 Panic disorder [episodic paroxysmal anxiety] without agoraphobia: Secondary | ICD-10-CM

## 2015-03-31 DIAGNOSIS — F411 Generalized anxiety disorder: Secondary | ICD-10-CM

## 2015-03-31 MED ORDER — PROPRANOLOL HCL 10 MG PO TABS
10.0000 mg | ORAL_TABLET | Freq: Every day | ORAL | Status: DC
Start: 2015-03-31 — End: 2015-07-10

## 2015-03-31 MED ORDER — SERTRALINE HCL 50 MG PO TABS: ORAL_TABLET | ORAL | Status: DC

## 2015-03-31 NOTE — Progress Notes (Signed)
[facility] [phone] Progress Note  Cynthia Robertson [id number] 24 y.o.  03/31/2015 2:18 PM  Chief Complaint: I got married 2 months ago.  I'm doing fine.  I need my medication.     History of Present Illness: Cynthia Robertson came for her followup appointment.  She is taking her medication as prescribed.  She got married in April second and went to [place] for 10 days for her honeymoon.  She had a very good time.  She admitted increased weight gain because she is careless about the diet and not doing regular exercise.  She is also very busy traveling and she has to go [place] few times for business trip.  Overall she described her mood has been stable.  She denies any major panic attack or anxiety attack.  She sleeping good.  She is taking Zoloft and Inderal.  She has no tremors or shakes.  She denies any crying spells, feeling of hopelessness or worthlessness.  Her energy level is good.  Patient denies drinking or using any illegal substances.  Suicidal Ideation: No Plan Formed: No Patient has means to carry out plan: No  Homicidal Ideation: No Plan Formed: No Patient has means to carry out plan: No  Review of Systems  Constitutional: Negative for weight loss.  Skin: Negative for itching and rash.  Neurological: Negative for dizziness and tremors.    Psychiatric: Agitation: No Hallucination: No Depressed Mood: No Insomnia: No Hypersomnia: No Altered Concentration: No Feels Worthless: No Grandiose Ideas: No Belief In Special Powers: No New/Increased Substance Abuse: No Compulsions: No  Neurologic: Headache: No Seizure: No Paresthesias: No  Medical History: Patient is diagnosed with sinus tachycardia and acid reflux disease.  Outpatient Encounter Prescriptions as of 03/31/2015  Medication Sig  . esomeprazole (NEXIUM) 20 MG capsule Take 20 mg by mouth daily at 12 noon.  . norgestimate-ethinyl estradiol (SPRINTEC 28) 0.25-35 MG-MCG tablet Take 1 tablet by mouth daily.  .  propranolol (INDERAL) 10 MG tablet Take 1 tablet (10 mg total) by mouth daily. Must see the physician for future refills.  . sertraline (ZOLOFT) 50 MG tablet Take 1 and 1/2 daily  . [DISCONTINUED] fluconazole (DIFLUCAN) 150 MG tablet Take 1 tablet (150 mg total) by mouth once. Take one tablet.  Repeat in 48 hours if symptoms are not completely resolved.  . [DISCONTINUED] metroNIDAZOLE (METROGEL) 0.75 % vaginal gel Place 1 Applicatorful vaginally at bedtime.  . [DISCONTINUED] propranolol (INDERAL) 10 MG tablet Take 1 tablet (10 mg total) by mouth daily. Must see the physician for future refills.  . [DISCONTINUED] sertraline (ZOLOFT) 50 MG tablet Take 1 and 1/2 daily   No facility-administered encounter medications on file as of 03/31/2015.    Past Psychiatric History/Hospitalization(s): Patient has one psychiatric admission to behavioral [facility] due to significant anxiety and passive suicidal thinking. She denies any history of paranoia, hallucination, mania, aggression, violence, suicidal at times. She has never seen a psychiatrist before psychiatric inpatient. She tried Celexa, Lexapro from primary care physician .  She also took Xanax in the past from primary care physician but stopped because it was not working.   Anxiety: Yes Bipolar Disorder: No Depression: Yes Mania: No Psychosis: No Schizophrenia: No Personality Disorder: No Hospitalization for psychiatric illness: Yes History of Electroconvulsive Shock Therapy: No Prior Suicide Attempts: No  Physical Exam: Constitutional:  BP 122/78 mmHg  Pulse 79  Ht 5' 8.25" (1.734 m)  Wt 205 lb 3.2 oz (93.078 kg)  BMI 30.96 kg/m2  General Appearance:  alert, oriented, no acute distress and well nourished  Musculoskeletal: Strength & Muscle Tone: within normal limits Gait & Station: normal Patient leans: N/A  Psychiatric: Speech (describe rate, volume, coherence, spontaneity, and abnormalities if any): Soft clear and coherent with  normal tone volume.  Thought Process (describe rate, content, abstract reasoning, and computation): Logical organized goal-directed.  Associations: Coherent, Relevant and Intact  Thoughts: normal  Mental Status: Orientation: oriented to person, place, time/date and situation Mood & Affect: anxiety Attention Span & Concentration: Good  Established Problem, Stable/Improving (1), Review of Last Therapy Session (1) and Review of New Medication or Change in Dosage (2)  Assessment: Axis I: Generalized anxiety disorder , panic disorder without agoraphobia .    Axis II: Deferred  Axis III:  Patient Active Problem List   Diagnosis Date Noted  . Right otitis media 10/22/2013  . UTI (urinary tract infection) 06/26/2013  . Sinusitis, acute 05/29/2013  . Routine general medical examination at a health care facility 03/13/2013  . Abscess of breast 03/13/2013  . Panic disorder without agoraphobia 03/06/2013  . GERD (gastroesophageal reflux disease) 02/14/2013  . Sinus tachycardia 02/12/2013  . Generalized anxiety disorder 01/22/2013   Plan:  Discuss weight gain and recommended to watch her calorie intake and to regular exercise.  Patient promised that she will work on her appetite and weight loss.  I will continue Zoloft 75 mg daily and Inderal 10 mg daily.  Recommend to call us back if she has any question or any concern.  Followup in 3 months.  Cynthia Robertson T., MD 03/31/2015

## 2015-05-06 ENCOUNTER — Encounter: Payer: Self-pay | Admitting: Obstetrics & Gynecology

## 2015-07-01 ENCOUNTER — Ambulatory Visit (HOSPITAL_COMMUNITY): Payer: Self-pay | Admitting: Psychiatry

## 2015-07-01 ENCOUNTER — Ambulatory Visit: Payer: 59 | Admitting: Obstetrics and Gynecology

## 2015-07-10 ENCOUNTER — Telehealth: Payer: Self-pay | Admitting: Obstetrics and Gynecology

## 2015-07-10 ENCOUNTER — Encounter (HOSPITAL_COMMUNITY): Payer: Self-pay | Admitting: Psychiatry

## 2015-07-10 ENCOUNTER — Ambulatory Visit: Payer: 59 | Admitting: Obstetrics and Gynecology

## 2015-07-10 ENCOUNTER — Ambulatory Visit (INDEPENDENT_AMBULATORY_CARE_PROVIDER_SITE_OTHER): Payer: 59 | Admitting: Psychiatry

## 2015-07-10 VITALS — BP 125/74 | HR 73 | Ht 68.0 in | Wt 204.0 lb

## 2015-07-10 DIAGNOSIS — F410 Panic disorder [episodic paroxysmal anxiety] without agoraphobia: Secondary | ICD-10-CM | POA: Diagnosis not present

## 2015-07-10 DIAGNOSIS — F411 Generalized anxiety disorder: Secondary | ICD-10-CM

## 2015-07-10 MED ORDER — SERTRALINE HCL 50 MG PO TABS: ORAL_TABLET | ORAL | Status: DC

## 2015-07-10 MED ORDER — PROPRANOLOL HCL 10 MG PO TABS: 10.0000 mg | ORAL_TABLET | Freq: Every day | ORAL | Status: DC

## 2015-07-10 NOTE — Progress Notes (Signed)
[facility] Progress Note  Cynthia Robertson [id number] 24 y.o.  07/10/2015 9:53 AM  Chief Complaint: Medication management and follow-up.    History of Present Illness: Cynthia Robertson came for her followup appointment.  She is doing better on her current medication.  She is taking Zoloft 75 mg and Inderal 10 mg in the morning.  She denies any major panic attack.  She has no tremors, shakes or any side effects.  She enjoys her work but admitted lately been very busy traveling.  Her husband also travels a lot.  She wants to continue her current psychiatric medication because it is helping her anxiety and nervousness and depression.  She sleeping good.  She denies any irritability, anger or any social isolation.  She admitted not able to keep her diet under control but now she is working with her husband to change her diet plan starting last week.  She is hoping to lose weight index few months.  Patient also not seen primary care physician in a while and planning to call her to schedule appointment for annual physical checkup and blood work.  Patient denies drinking or using any illegal substances.  Her energy level is good.  Her appetite is okay.  Her vitals are stable.  Suicidal Ideation: No Plan Formed: No Patient has means to carry out plan: No  Homicidal Ideation: No Plan Formed: No Patient has means to carry out plan: No  Review of Systems  Constitutional: Negative for weight loss.  Skin: Negative for itching and rash.  Neurological: Negative for dizziness and tremors.    Psychiatric: Agitation: No Hallucination: No Depressed Mood: No Insomnia: No Hypersomnia: No Altered Concentration: No Feels Worthless: No Grandiose Ideas: No Belief In Special Powers: No New/Increased Substance Abuse: No Compulsions: No  Neurologic: Headache: No Seizure: No Paresthesias: No  Medical History:  Patient is diagnosed with sinus tachycardia and acid reflux disease.  Her primary  care physician is Dr. Debbrah Alar at [place].   Outpatient Encounter Prescriptions as of 07/10/2015  Medication Sig  . esomeprazole (NEXIUM) 20 MG capsule Take 20 mg by mouth daily at 12 noon.  . norgestimate-ethinyl estradiol (SPRINTEC 28) 0.25-35 MG-MCG tablet Take 1 tablet by mouth daily.  . propranolol (INDERAL) 10 MG tablet Take 1 tablet (10 mg total) by mouth daily. Must see the physician for future refills.  . sertraline (ZOLOFT) 50 MG tablet Take 1 and 1/2 daily  . [DISCONTINUED] propranolol (INDERAL) 10 MG tablet Take 1 tablet (10 mg total) by mouth daily. Must see the physician for future refills.  . [DISCONTINUED] sertraline (ZOLOFT) 50 MG tablet Take 1 and 1/2 daily   No facility-administered encounter medications on file as of 07/10/2015.    Past Psychiatric History/Hospitalization(s): Patient has one psychiatric admission to behavioral [place] due to significant anxiety and passive suicidal thinking. She denies any history of paranoia, hallucination, mania, aggression, violence, suicidal at times. She has never seen a psychiatrist before psychiatric inpatient. She tried Celexa, Lexapro from primary care physician .  She also took Xanax in the past from primary care physician but stopped because it was not working.   Anxiety: Yes Bipolar Disorder: No Depression: Yes Mania: No Psychosis: No Schizophrenia: No Personality Disorder: No Hospitalization for psychiatric illness: Yes History of Electroconvulsive Shock Therapy: No Prior Suicide Attempts: No  Physical Exam: Constitutional:  BP 125/74 mmHg  Pulse 73  Ht 5\' 8"  (1.727 m)  Wt 204 lb (92.534 kg)  BMI 31.03 kg/m2  General  Appearance: alert, oriented, no acute distress and well nourished  Musculoskeletal: Strength & Muscle Tone: within normal limits Gait & Station: normal Patient leans: N/A  Psychiatric: Speech (describe rate, volume, coherence, spontaneity, and abnormalities if any): Soft clear and  coherent with normal tone volume.  Thought Process (describe rate, content, abstract reasoning, and computation): Logical organized goal-directed.  Associations: Coherent, Relevant and Intact  Thoughts: normal  Mental Status: Orientation: oriented to person, place, time/date and situation Mood & Affect: normal affect Attention Span & Concentration: Good  Established Problem, Stable/Improving (1), Review of Last Therapy Session (1) and Review of New Medication or Change in Dosage (2)  Assessment: Axis I: Generalized anxiety disorder , panic disorder without agoraphobia .    Axis II: Deferred  Axis III:  Patient Active Problem List   Diagnosis Date Noted  . Right otitis media 10/22/2013  . Panic disorder without agoraphobia 03/06/2013  . GERD (gastroesophageal reflux disease) 02/14/2013  . Sinus tachycardia 02/12/2013  . Generalized anxiety disorder 01/22/2013   Plan:  Patient is a stable on Zoloft 75 mg daily and Inderal 10 mg daily.  She has no side effects including any tremors or shakes.  Discuss weight gain and patient is planning to go on diet with her husband.  She also promised to call her primary care physician Dr. Conley Canal to schedule an old physical checkup and blood work.  I will continue Zoloft 75 mg daily and Inderal 10 mg daily.  Recommend to call us back if she has any question or any concern.  Followup in 3 months.  ARFEEN,SYED T., MD 07/10/2015

## 2015-07-10 NOTE — Telephone Encounter (Signed)
Patient left message on our answering machine in regards to cancelling today's appointment she said she will call back and reschedule.

## 2015-07-15 ENCOUNTER — Other Ambulatory Visit (HOSPITAL_COMMUNITY): Payer: Self-pay | Admitting: Psychiatry

## 2015-07-16 ENCOUNTER — Other Ambulatory Visit (HOSPITAL_COMMUNITY): Payer: Self-pay | Admitting: Psychiatry

## 2015-07-16 NOTE — Telephone Encounter (Signed)
Given on 07/10/15 with 2 rerflls

## 2015-08-18 ENCOUNTER — Other Ambulatory Visit (HOSPITAL_COMMUNITY): Payer: Self-pay | Admitting: Psychiatry

## 2015-08-19 NOTE — Telephone Encounter (Signed)
Patient has scripts at [facility]. Pt will pick up this afternoon when ready.

## 2015-08-22 ENCOUNTER — Other Ambulatory Visit (HOSPITAL_COMMUNITY): Payer: Self-pay | Admitting: *Deleted

## 2015-08-22 ENCOUNTER — Telehealth (HOSPITAL_COMMUNITY): Payer: Self-pay | Admitting: *Deleted

## 2015-08-22 NOTE — Telephone Encounter (Signed)
Pt called stating her pharmacy is telling her they do not have a refill on file for her propanolol. Called and spoke with Valarie Merino at [facility] who states that it is ready for pick up. Called to notify patient.

## 2015-08-22 NOTE — Telephone Encounter (Signed)
Refill request for Propranolol received. Chart reviewed, last ordered 07/10/15 with 2 refills given. Requested too early. Patient also called stating that [facility] will not fill her medication stating she does not have any refills. Called [facility] and was told they show patient picked up prescription on 08/19/15. Pt states she did not pick up any prescription from them and she is calling [facility] to find out what is happening with her medications. Pharmacist "Joe" called to ask for refill of Inderal. Explained to him that it was escribed 9/29 with 2 refills. Joe states that he will take care of it and nothing further is needed. Called to notify patient that she will be able to get her prescription today.

## 2015-09-25 ENCOUNTER — Ambulatory Visit (HOSPITAL_COMMUNITY): Payer: Self-pay | Admitting: Psychiatry

## 2015-09-29 ENCOUNTER — Ambulatory Visit (INDEPENDENT_AMBULATORY_CARE_PROVIDER_SITE_OTHER): Payer: 59 | Admitting: Family

## 2015-09-29 ENCOUNTER — Encounter: Payer: Self-pay | Admitting: Family

## 2015-09-29 ENCOUNTER — Other Ambulatory Visit (HOSPITAL_COMMUNITY): Payer: Self-pay | Admitting: Psychiatry

## 2015-09-29 VITALS — BP 110/60 | HR 95 | Temp 98.4°F | Resp 14 | Ht 68.0 in | Wt 236.0 lb

## 2015-09-29 DIAGNOSIS — H6591 Unspecified nonsuppurative otitis media, right ear: Secondary | ICD-10-CM

## 2015-09-29 DIAGNOSIS — J0100 Acute maxillary sinusitis, unspecified: Secondary | ICD-10-CM

## 2015-09-29 MED ORDER — AMOXICILLIN-POT CLAVULANATE 875-125 MG PO TABS: 1.0000 | ORAL_TABLET | Freq: Two times a day (BID) | ORAL | Status: DC

## 2015-09-29 NOTE — Patient Instructions (Signed)
Please begin augmentin for sinus infection and right ear infection.  Call if symptoms worsen or if symptoms are not improved in 3 days.

## 2015-09-29 NOTE — Progress Notes (Signed)
Pre visit review using our clinic review tool, if applicable. No additional management support is needed unless otherwise documented below in the visit note. 

## 2015-09-29 NOTE — Progress Notes (Signed)
Subjective:    Patient ID: Cynthia Robertson, female    DOB: 01/17/1991, 24 y.o.   MRN: [id number]  HPI  Cynthia Robertson is a 24 yr old female who presents today with chief complaint of cough and nasal congestion.  Symptoms have been present x 2 weeks.  Reports intermittent low grade fever Tmax 99.8.  Repots + otalgia.  + sinus pressure, + sinus drainage (bloody last few days- prior drainage was green in color)  Tried mucinex with minimal improvement.  Ibuprofen helps with headache.   Review of Systems    see HPI  Past Medical History  Diagnosis Date  . Anxiety   . History of chicken pox   . GERD (gastroesophageal reflux disease)     Social History   Social History  . Marital Status: Single    Spouse Name: N/A  . Number of Children: N/A  . Years of Education: N/A   Occupational History  . Not on file.   Social History Main Topics  . Smoking status: Never Smoker   . Smokeless tobacco: Never Used  . Alcohol Use: No     Comment: Occ Beer,Alcohol, stopped drinking April 11th, due to increased panic attacks. Drank excessively after her father committed suicide   . Drug Use: No  . Sexual Activity:    Partners: Male    Birth Control/ Protection: Condom, Pill   Other Topics Concern  . 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 Wisdom tooth extraction  04/2012    Family History  Problem Relation Age of Onset  . Hyperlipidemia Mother   . Stroke Maternal Grandfather   . Depression Father   . Alcohol abuse Father   . Suicidality Father     No Known Allergies  Current Outpatient Prescriptions on File Prior to Visit  Medication Sig Dispense Refill  . esomeprazole (NEXIUM) 20 MG capsule Take 20 mg by mouth daily at 12 noon.    . propranolol (INDERAL) 10 MG tablet  Take 1 tablet (10 mg total) by mouth daily. Must see the physician for future refills. 30 tablet 2  . sertraline (ZOLOFT) 50 MG tablet Take 1 and 1/2 daily 45 tablet 2   No current facility-administered medications on file prior to visit.    BP 110/60 mmHg  Pulse 95  Temp(Src) 98.4 F (36.9 C)  Resp 14  Ht 5\' 8"  (1.727 m)  Wt 236 lb (107.049 kg)  BMI 35.89 kg/m2  SpO2 99%  LMP 08/30/2015 (Approximate)    Objective:   Physical Exam  Constitutional: She appears well-developed and well-nourished.  HENT:  Right Ear: Tympanic membrane is erythematous and retracted.  Left Ear: Tympanic membrane and ear canal normal.  Nose: Right sinus exhibits maxillary sinus tenderness. Left sinus exhibits maxillary sinus tenderness.  Cardiovascular: Normal rate, regular rhythm and normal heart sounds.   No murmur heard. Pulmonary/Chest: Effort normal and breath sounds normal. No respiratory distress. She has no wheezes.  Psychiatric: She has a normal mood and affect. Her behavior is normal. Judgment and thought content normal.          Assessment & Plan:  R Otitis Media/Bilatereal Sinusitis- rx with augmentin.  Advised pt to call  if symptoms worsen or if not improved in 3 days.

## 2015-09-30 ENCOUNTER — Ambulatory Visit (HOSPITAL_COMMUNITY): Payer: Self-pay | Admitting: Psychiatry

## 2015-10-01 ENCOUNTER — Telehealth (HOSPITAL_COMMUNITY): Payer: Self-pay

## 2015-10-01 DIAGNOSIS — F411 Generalized anxiety disorder: Secondary | ICD-10-CM

## 2015-10-01 MED ORDER — SERTRALINE HCL 50 MG PO TABS: ORAL_TABLET | ORAL | Status: DC

## 2015-10-01 NOTE — Telephone Encounter (Signed)
Telephone call with patient stating she was scheduled 09/30/15 to see Dr. Salem Senate but was rescheduled for 10/08/15 due to provider being out that date and now does not have enough Zoloft to last until appointment set for 10/08/15.  States she only has a few days remaining and not enough until appointment so requests a one time refill until she can be seen on 10/08/15.

## 2015-10-01 NOTE — Telephone Encounter (Signed)
A prescription for Zoloft sent to the pharmacy one-month supply 0 refills

## 2015-10-02 NOTE — Telephone Encounter (Signed)
Telephone call with patient to inform her new order for Zoloft was e-scribed to her [facility] on 10/01/15.  Patient stated she went to pick up order today but was told she could not fill until 10/08/15.  Agreed to call to make sure [facility] had order from 10/01/15 and confirmed this was received by pharmacist technician, Eugenia Mcalpine who also reported she was not sure why not filled but it was going through and would be ready for patient within an hour to pick up.  Called patient back to inform and patient to call if any further problems getting refill.

## 2015-10-08 ENCOUNTER — Ambulatory Visit (HOSPITAL_COMMUNITY): Payer: Self-pay | Admitting: Psychiatry

## 2015-10-17 ENCOUNTER — Ambulatory Visit (INDEPENDENT_AMBULATORY_CARE_PROVIDER_SITE_OTHER): Payer: 59 | Admitting: Certified Nurse Midwife

## 2015-10-17 ENCOUNTER — Encounter: Payer: Self-pay | Admitting: Certified Nurse Midwife

## 2015-10-17 VITALS — BP 118/60 | HR 68 | Resp 16 | Ht 68.25 in | Wt 234.0 lb

## 2015-10-17 DIAGNOSIS — Z01419 Encounter for gynecological examination (general) (routine) without abnormal findings: Secondary | ICD-10-CM

## 2015-10-17 DIAGNOSIS — Z0000 Encounter for general adult medical examination without abnormal findings: Secondary | ICD-10-CM | POA: Diagnosis not present

## 2015-10-17 DIAGNOSIS — Z124 Encounter for screening for malignant neoplasm of cervix: Secondary | ICD-10-CM

## 2015-10-17 NOTE — Progress Notes (Signed)
25 y.o. G0P0 Single  Caucasian Fe here for annual exam. Periods normal no issues in past year. Contraception condoms working well. Desires STD screening for peace of mind. No change in vaginal discharge or symptoms. Sees PCP for headache and anxiety management. Recent antibiotic use for URI, resolved. No other health issues today.  Patient's last menstrual period was 10/01/2015.          Sexually active: Yes.    The current method of family planning is condoms sometimes.    Exercising: No.  exercise Smoker:  no  Health Maintenance: Pap:  01-22-13 neg HPV HR neg no abnormals, request another one this year MMG: none Colonoscopy:  none BMD:   none TDaP:  Unsure, within 17yrs Shingles: no Pneumonia: no Hep C and HIV: desires today Labs: hgb-12.6 Self breast exam: not done   reports that she has never smoked. She has never used smokeless tobacco. She reports that she drinks about 0.6 - 1.2 oz of alcohol per week. She reports that she does not use illicit drugs.  Past Medical History  Diagnosis Date  . Anxiety   . History of chicken pox   . GERD (gastroesophageal reflux disease)     Past Surgical History  Procedure Laterality Date  . Wisdom tooth extraction  04/2012    Current Outpatient Prescriptions  Medication Sig Dispense Refill  . amoxicillin-clavulanate (AUGMENTIN) 875-125 MG tablet Take 1 tablet by mouth 2 (two) times daily. 20 tablet 0  . esomeprazole (NEXIUM) 20 MG capsule Take 20 mg by mouth daily at 12 noon.    . propranolol (INDERAL) 10 MG tablet Take 1 tablet (10 mg total) by mouth daily. Must see the physician for future refills. 30 tablet 2  . sertraline (ZOLOFT) 50 MG tablet Take 1 and 1/2 daily 45 tablet 0   No current facility-administered medications for this visit.    Family History  Problem Relation Age of Onset  . Hyperlipidemia Mother   . Stroke Maternal Grandfather   . Depression Father   . Alcohol abuse Father   . Suicidality Father     ROS:   Pertinent items are noted in HPI.  Otherwise, a comprehensive ROS was negative.  Exam:   BP 118/60 mmHg  Pulse 68  Resp 16  Ht 5' 8.25" (1.734 m)  Wt 234 lb (106.142 kg)  BMI 35.30 kg/m2  LMP 10/01/2015 Height: 5' 8.25" (173.4 cm) Ht Readings from Last 3 Encounters:  10/17/15 5' 8.25" (1.734 m)  09/29/15 5\' 8"  (1.727 m)  07/10/15 5\' 8"  (1.727 m)    General appearance: alert, cooperative and appears stated age Head: Normocephalic, without obvious abnormality, atraumatic Neck: no adenopathy, supple, symmetrical, trachea midline and thyroid normal to inspection and palpation Lungs: clear to auscultation bilaterally Breasts: normal appearance, no masses or tenderness, No nipple retraction or dimpling, No nipple discharge or bleeding, No axillary or supraclavicular adenopathy Heart: regular rate and rhythm Abdomen: soft, non-tender; no masses,  no organomegaly Extremities: extremities normal, atraumatic, no cyanosis or edema Skin: Skin color, texture, turgor normal. No rashes or lesions Lymph nodes: Cervical, supraclavicular, and axillary nodes normal. No abnormal inguinal nodes palpated Neurologic: Grossly normal   Pelvic: External genitalia:  no lesions              Urethra:  normal appearing urethra with no masses, tenderness or lesions              Bartholin's and Skene's: normal  Vagina: normal appearing vagina with normal color and discharge, no lesions, affirm taken              Cervix: normal, nontender, no lesions              Pap taken: Yes.   Bimanual Exam:  Uterus:  normal size, contour, position, consistency, mobility, non-tender              Adnexa: normal adnexa and no mass, fullness, tenderness               Rectovaginal: Confirms               Anus:  normal sphincter tone, no lesions  Chaperone present: yes  A:  Well Woman with normal exam  Contraception condoms  STD screening  Sinus tachycardia and anxiety management per PCP   P:   Reviewed  health and wellness pertinent to exam  Labs: HIV,STD panel, Hep C, HSV 1,2,Gc,chlamydia  Pap smear as above with HPV reflex   counseled on breast self exam, STD prevention, HIV risk factors and prevention,, adequate intake of calcium and vitamin D, diet and exercise  return annually or prn  An After Visit Summary was printed and given to the patient.

## 2015-10-17 NOTE — Patient Instructions (Signed)

## 2015-10-18 LAB — WET PREP BY MOLECULAR PROBE
Candida species: POSITIVE — AB
GARDNERELLA VAGINALIS: POSITIVE — AB
Trichomonas vaginosis: NEGATIVE

## 2015-10-18 LAB — STD PANEL
HIV: NONREACTIVE
Hepatitis B Surface Ag: NEGATIVE

## 2015-10-18 LAB — HEPATITIS C ANTIBODY: HCV Ab: NEGATIVE

## 2015-10-20 LAB — HEMOGLOBIN, FINGERSTICK: Hemoglobin, fingerstick: 12.6 g/dL (ref 12.0–16.0)

## 2015-10-20 LAB — HSV(HERPES SIMPLEX VRS) I + II AB-IGG

## 2015-10-20 NOTE — Progress Notes (Signed)
Reviewed personally.  M. Suzanne Robertlee Rogacki, MD.  

## 2015-10-21 ENCOUNTER — Telehealth: Payer: Self-pay

## 2015-10-21 LAB — IPS PAP TEST WITH REFLEX TO HPV

## 2015-10-21 LAB — IPS N GONORRHOEA AND CHLAMYDIA BY PCR

## 2015-10-21 NOTE — Telephone Encounter (Signed)
Patient notified of results. See lab 

## 2015-10-21 NOTE — Telephone Encounter (Signed)
-----   Message from Regina Eck, CNM sent at 10/21/2015  8:53 AM EST ----- Notify patient that affirm was positive for yeast and BV, negative for trichomonas.  If she has been using consistent condoms will go ahead and treat. If not will need to wait until next period. Please advise for RX. HSV 1,2 negative Hep B,C, RPR, HIV are negative GC,Chlamydia pending

## 2015-10-21 NOTE — Telephone Encounter (Signed)
lmtcb

## 2015-10-23 ENCOUNTER — Ambulatory Visit: Payer: 59 | Admitting: Certified Nurse Midwife

## 2015-10-29 ENCOUNTER — Telehealth: Payer: Self-pay | Admitting: Certified Nurse Midwife

## 2015-10-29 MED ORDER — METRONIDAZOLE 0.75 % VA GEL: 1.0000 | Freq: Two times a day (BID) | VAGINAL | Status: DC

## 2015-10-29 MED ORDER — FLUCONAZOLE 150 MG PO TABS: 150.0000 mg | ORAL_TABLET | Freq: Once | ORAL | Status: DC

## 2015-10-29 MED ORDER — METRONIDAZOLE 0.75 % VA GEL: 1.0000 | Freq: Every day | VAGINAL | Status: DC

## 2015-10-29 NOTE — Telephone Encounter (Signed)
Patient called to report she stopped her cycle to arrange to start a medication. She said, "I stopped my menstrual cycle yesterday." See last telephone encounter.

## 2015-10-29 NOTE — Telephone Encounter (Signed)
Spoke with patient. Patient states that her menses stopped yesterday. Is returning call to start Metrogel and Diflucan. Rx for Metrogel x5 days and Diflucan 150 mg #2 0RF sent to pharmacy on file. Patient is agreeable.  Notes Recorded by Susy Manor, CMA on 10/23/2015 at 1:35 PM aex is 1/18. Normal results released to mychart Notes Recorded by Regina Eck, CNM on 10/23/2015 at 11:52 AM Notify patient that Gc/Chlamydia negative Pap smear negative 02 did show yeast which she was treated for Notes Recorded by Susy Manor, CMA on 10/21/2015 at 11:32 AM Patient notified of results as written by provider. Pt has not used consistent condom use. Pt will call when her next cycle starts. Notes Recorded by Susy Manor, CMA on 10/21/2015 at 11:20 AM Left message for patient to callback Notes Recorded by Regina Eck, CNM on 10/21/2015 at 8:53 AM Notify patient that affirm was positive for yeast and BV, negative for trichomonas. If she has been using consistent condoms will go ahead and treat. If not will need to wait until next period. Please advise for RX. HSV 1,2 negative Hep B,C, RPR, HIV are negative GC,Chlamydia pending  Routing to provider for final review. Patient agreeable to disposition. Will close encounter.

## 2015-12-08 ENCOUNTER — Ambulatory Visit (HOSPITAL_COMMUNITY): Payer: Self-pay | Admitting: Psychiatry

## 2015-12-09 ENCOUNTER — Ambulatory Visit (HOSPITAL_COMMUNITY): Payer: Self-pay | Admitting: Psychiatry

## 2015-12-15 ENCOUNTER — Telehealth (HOSPITAL_COMMUNITY): Payer: Self-pay

## 2015-12-15 DIAGNOSIS — F411 Generalized anxiety disorder: Secondary | ICD-10-CM

## 2015-12-15 MED ORDER — PROPRANOLOL HCL 10 MG PO TABS: 10.0000 mg | ORAL_TABLET | Freq: Every day | ORAL | Status: DC

## 2015-12-15 MED ORDER — SERTRALINE HCL 50 MG PO TABS: ORAL_TABLET | ORAL | Status: DC

## 2015-12-15 NOTE — Telephone Encounter (Signed)
Okay to provide 30 day supply. 

## 2015-12-15 NOTE — Telephone Encounter (Signed)
Patient calling for a refill on her Zoloft and Orvan Seen, she was last seen on 9/29 and has a f/u on 3/30. Okay to refill? Please advise, thank you

## 2015-12-15 NOTE — Telephone Encounter (Signed)
Sent in 30 day supply of both medications

## 2016-01-08 ENCOUNTER — Ambulatory Visit (HOSPITAL_COMMUNITY): Payer: Self-pay | Admitting: Psychiatry

## 2016-01-14 ENCOUNTER — Encounter (HOSPITAL_COMMUNITY): Payer: Self-pay | Admitting: Psychiatry

## 2016-01-14 ENCOUNTER — Ambulatory Visit (INDEPENDENT_AMBULATORY_CARE_PROVIDER_SITE_OTHER): Payer: 59 | Admitting: Psychiatry

## 2016-01-14 VITALS — BP 116/78 | HR 73 | Ht 68.0 in | Wt 243.2 lb

## 2016-01-14 DIAGNOSIS — F4001 Agoraphobia with panic disorder: Secondary | ICD-10-CM | POA: Diagnosis not present

## 2016-01-14 DIAGNOSIS — F411 Generalized anxiety disorder: Secondary | ICD-10-CM | POA: Diagnosis not present

## 2016-01-14 MED ORDER — PROPRANOLOL HCL 10 MG PO TABS: 10.0000 mg | ORAL_TABLET | Freq: Every day | ORAL | Status: DC

## 2016-01-14 MED ORDER — SERTRALINE HCL 50 MG PO TABS: ORAL_TABLET | ORAL | Status: DC

## 2016-01-14 NOTE — Progress Notes (Signed)
[facility] Progress Note  ANNAALICIA Robertson [id number] 25 y.o.  01/14/2016 2:28 PM  Chief Complaint: Medication management and follow-up.    History of Present Illness: Cynthia Robertson came for her followup appointment.  She is taking her medication as prescribed.  Last Sunday she has her first year wedding anniversary .  She had a very good time.  She missed last appointment because of travel .  Overall she described her mood is a stable.  She has gained weight and recently she started weight loss program and hoping to lose some weight.  She admitted not able to keep her diet under control and not doing that her exercise causing weight gain.  She likes Zoloft and Inderal .  She denies any major panic attack.  Her anxiety and depression is under control.  She had blood work done recently prior to start weight loss program and she was told everything is fine.  Patient denies drinking or using any illegal substances.  Patient denies any feeling of hopelessness or worthlessness.  She has no paranoia or any hallucination.  She had gained more than 30 pounds in past 6 months.  Suicidal Ideation: No Plan Formed: No Patient has means to carry out plan: No  Homicidal Ideation: No Plan Formed: No Patient has means to carry out plan: No  Review of Systems  Constitutional: Negative for weight loss.  Skin: Negative for itching and rash.  Neurological: Negative for dizziness and tremors.    Psychiatric: Agitation: No Hallucination: No Depressed Mood: No Insomnia: No Hypersomnia: No Altered Concentration: No Feels Worthless: No Grandiose Ideas: No Belief In Special Powers: No New/Increased Substance Abuse: No Compulsions: No  Neurologic: Headache: No Seizure: No Paresthesias: No  Medical History:  Patient is diagnosed with sinus tachycardia and acid reflux disease.  Her primary care physician is Dr. Debbrah Alar at [facility].   Outpatient Encounter Prescriptions as of  01/14/2016  Medication Sig  . esomeprazole (NEXIUM) 20 MG capsule Take 20 mg by mouth daily at 12 noon.  . propranolol (INDERAL) 10 MG tablet Take 1 tablet (10 mg total) by mouth daily. Must see the physician for future refills.  . sertraline (ZOLOFT) 50 MG tablet Take 1 and 1/2 daily  . [DISCONTINUED] amoxicillin-clavulanate (AUGMENTIN) 875-125 MG tablet Take 1 tablet by mouth 2 (two) times daily.  . [DISCONTINUED] fluconazole (DIFLUCAN) 150 MG tablet Take 1 tablet (150 mg total) by mouth once.  . [DISCONTINUED] metroNIDAZOLE (METROGEL) 0.75 % vaginal gel Place 1 Applicatorful vaginally at bedtime. For five days  . [DISCONTINUED] propranolol (INDERAL) 10 MG tablet Take 1 tablet (10 mg total) by mouth daily. Must see the physician for future refills.  . [DISCONTINUED] sertraline (ZOLOFT) 50 MG tablet Take 1 and 1/2 daily   No facility-administered encounter medications on file as of 01/14/2016.    Past Psychiatric History/Hospitalization(s): Patient has one psychiatric admission to behavioral [place] due to significant anxiety and passive suicidal thinking. She denies any history of paranoia, hallucination, mania, aggression, violence, suicidal at times. She has never seen a psychiatrist before psychiatric inpatient. She tried Celexa, Lexapro from primary care physician .  She also took Xanax in the past from primary care physician but stopped because it was not working.   Anxiety: Yes Bipolar Disorder: No Depression: Yes Mania: No Psychosis: No Schizophrenia: No Personality Disorder: No Hospitalization for psychiatric illness: Yes History of Electroconvulsive Shock Therapy: No Prior Suicide Attempts: No  Physical Exam: Constitutional:  BP 116/78 mmHg  Pulse  73  Ht 5\' 8"  (1.727 m)  Wt 243 lb 3.2 oz (110.315 kg)  BMI 36.99 kg/m2  LMP 12/24/2015 (Approximate)  General Appearance: alert, oriented, no acute distress and well nourished  Musculoskeletal: Strength & Muscle Tone:  within normal limits Gait & Station: normal Patient leans: N/A  Psychiatric: Speech (describe rate, volume, coherence, spontaneity, and abnormalities if any): Soft clear and coherent with normal tone volume.  Thought Process (describe rate, content, abstract reasoning, and computation): Logical organized goal-directed.  Associations: Coherent, Relevant and Intact  Thoughts: normal  Mental Status: Orientation: oriented to person, place, time/date and situation Mood & Affect: normal affect Attention Span & Concentration: Good  Established Problem, Stable/Improving (1), Review of Last Therapy Session (1) and Review of New Medication or Change in Dosage (2)  Assessment: Axis I: Generalized anxiety disorder , panic disorder without agoraphobia .    Axis II: Deferred  Axis III:  Patient Active Problem List   Diagnosis Date Noted  . Right otitis media 10/22/2013  . Panic disorder without agoraphobia 03/06/2013  . GERD (gastroesophageal reflux disease) 02/14/2013  . Sinus tachycardia ([place]) 02/12/2013  . Generalized anxiety disorder 01/22/2013   Plan:  Patient is a stable on Zoloft 75 mg daily and Inderal 10 mg daily.  She has no side effects including any tremors or shakes.  Discuss weight gain and patient Started recently weight loss program and hoping to have some results in coming weeks.  I also encouraged to watch calorie intake and to regular exercise.  Patient recently had physical and blood work which she was told everything normal.  I will continue Zoloft 75 mg daily and Inderal 10 mg daily.  Recommend to call us back if she has any question or any concern.  Followup in 3 months.  Izzie Geers T., MD 01/14/2016

## 2016-04-28 ENCOUNTER — Ambulatory Visit (HOSPITAL_COMMUNITY): Payer: Self-pay | Admitting: Psychiatry

## 2016-06-09 ENCOUNTER — Other Ambulatory Visit (HOSPITAL_COMMUNITY): Payer: Self-pay

## 2016-06-09 DIAGNOSIS — F411 Generalized anxiety disorder: Secondary | ICD-10-CM

## 2016-06-09 MED ORDER — SERTRALINE HCL 50 MG PO TABS: ORAL_TABLET | ORAL | 2 refills | Status: DC

## 2016-06-09 MED ORDER — PROPRANOLOL HCL 10 MG PO TABS: 10.0000 mg | ORAL_TABLET | Freq: Every day | ORAL | 2 refills | Status: DC

## 2016-06-09 NOTE — Progress Notes (Signed)
Patient called for a refill on her propranolol and Zoloft, patient has a follow up appt. In Oct. Refill is appropriate and was sent to the pharmacy per protocol, with 2 refills to get her to her appt.

## 2016-07-02 ENCOUNTER — Telehealth: Payer: Self-pay | Admitting: Family

## 2016-07-02 ENCOUNTER — Ambulatory Visit: Payer: Self-pay | Admitting: Family

## 2016-07-02 NOTE — Telephone Encounter (Signed)
Pt lvm this morning to cancel appt she says that she feels better. She will call us back to schedule if she feels worse.

## 2016-07-02 NOTE — Telephone Encounter (Signed)
Noted  

## 2016-07-12 ENCOUNTER — Ambulatory Visit: Payer: Self-pay | Admitting: Family

## 2016-07-12 ENCOUNTER — Telehealth: Payer: Self-pay | Admitting: Family

## 2016-07-12 ENCOUNTER — Encounter: Payer: Self-pay | Admitting: Family

## 2016-07-12 NOTE — Telephone Encounter (Signed)
Patient lvm at 8:07am this morning cancelling 3:45pm appointment today. Patient [facility] to 07/21/16/ charge or no charge

## 2016-07-12 NOTE — Telephone Encounter (Signed)
No charge. 

## 2016-07-12 NOTE — Telephone Encounter (Signed)
error:[id number] ° °

## 2016-07-21 ENCOUNTER — Ambulatory Visit: Payer: Self-pay | Admitting: Family

## 2016-07-27 ENCOUNTER — Ambulatory Visit (HOSPITAL_COMMUNITY): Payer: Self-pay | Admitting: Psychiatry

## 2016-07-28 ENCOUNTER — Encounter: Payer: Self-pay | Admitting: Family

## 2016-08-24 ENCOUNTER — Encounter: Payer: Self-pay | Admitting: Family

## 2016-09-21 ENCOUNTER — Encounter: Payer: Self-pay | Admitting: Family

## 2016-10-12 ENCOUNTER — Encounter (INDEPENDENT_AMBULATORY_CARE_PROVIDER_SITE_OTHER): Payer: 59 | Admitting: Family

## 2016-10-12 ENCOUNTER — Encounter: Payer: Self-pay | Admitting: Family

## 2016-10-12 DIAGNOSIS — Z0289 Encounter for other administrative examinations: Secondary | ICD-10-CM

## 2016-10-21 ENCOUNTER — Ambulatory Visit: Payer: 59 | Admitting: Certified Nurse Midwife

## 2016-11-11 ENCOUNTER — Ambulatory Visit: Payer: 59 | Admitting: Certified Nurse Midwife

## 2016-11-18 ENCOUNTER — Ambulatory Visit: Payer: Self-pay | Admitting: Family Medicine

## 2016-11-19 ENCOUNTER — Ambulatory Visit: Payer: Self-pay | Admitting: Family

## 2016-11-22 ENCOUNTER — Ambulatory Visit (INDEPENDENT_AMBULATORY_CARE_PROVIDER_SITE_OTHER): Payer: 59 | Admitting: Medical

## 2016-11-22 ENCOUNTER — Ambulatory Visit (HOSPITAL_BASED_OUTPATIENT_CLINIC_OR_DEPARTMENT_OTHER)
Admission: RE | Admit: 2016-11-22 | Discharge: 2016-11-22 | Disposition: A | Discharge status: Home / Self Care | Payer: 59 | Source: Ambulatory Visit | Attending: Medical | Admitting: Medical

## 2016-11-22 ENCOUNTER — Encounter: Payer: Self-pay | Admitting: Medical

## 2016-11-22 VITALS — BP 119/77 | HR 79 | Temp 98.1°F | Resp 16 | Ht 68.0 in | Wt 248.5 lb

## 2016-11-22 DIAGNOSIS — R109 Unspecified abdominal pain: Secondary | ICD-10-CM

## 2016-11-22 DIAGNOSIS — K5900 Constipation, unspecified: Secondary | ICD-10-CM | POA: Diagnosis not present

## 2016-11-22 DIAGNOSIS — L989 Disorder of the skin and subcutaneous tissue, unspecified: Secondary | ICD-10-CM

## 2016-11-22 LAB — CBC WITH DIFFERENTIAL/PLATELET
BASOS ABS: 0.1 10*3/uL (ref 0.0–0.1)
Basophils Relative: 0.8 % (ref 0.0–3.0)
Eosinophils Absolute: 0.1 10*3/uL (ref 0.0–0.7)
Eosinophils Relative: 1 % (ref 0.0–5.0)
HEMATOCRIT: 39.4 % (ref 36.0–46.0)
Hemoglobin: 12.7 g/dL (ref 12.0–15.0)
LYMPHS ABS: 2.1 10*3/uL (ref 0.7–4.0)
LYMPHS PCT: 31.6 % (ref 12.0–46.0)
MCHC: 32.2 g/dL (ref 30.0–36.0)
MCV: 88.9 fl (ref 78.0–100.0)
MONOS PCT: 6.3 % (ref 3.0–12.0)
Monocytes Absolute: 0.4 10*3/uL (ref 0.1–1.0)
NEUTROS PCT: 60.3 % (ref 43.0–77.0)
Neutro Abs: 4 10*3/uL (ref 1.4–7.7)
Platelets: 344 10*3/uL (ref 150.0–400.0)
RBC: 4.43 Mil/uL (ref 3.87–5.11)
RDW: 15 % (ref 11.5–15.5)
WBC: 6.6 10*3/uL (ref 4.0–10.5)

## 2016-11-22 LAB — LIPASE: LIPASE: 42 U/L (ref 11.0–59.0)

## 2016-11-22 LAB — COMPREHENSIVE METABOLIC PANEL
ALBUMIN: 4.3 g/dL (ref 3.5–5.2)
ALK PHOS: 94 U/L (ref 39–117)
ALT: 36 U/L — ABNORMAL HIGH (ref 0–35)
AST: 60 U/L — ABNORMAL HIGH (ref 0–37)
BILIRUBIN TOTAL: 0.7 mg/dL (ref 0.2–1.2)
BUN: 7 mg/dL (ref 6–23)
CALCIUM: 9.5 mg/dL (ref 8.4–10.5)
CO2: 27 mEq/L (ref 19–32)
Chloride: 105 mEq/L (ref 96–112)
Creatinine, Ser: 0.97 mg/dL (ref 0.40–1.20)
GFR: 73.23 mL/min (ref >60.00)
Glucose, Bld: 108 mg/dL — ABNORMAL HIGH (ref 70–99)
Potassium: 4.1 mEq/L (ref 3.5–5.1)
Sodium: 140 mEq/L (ref 135–145)
TOTAL PROTEIN: 7.1 g/dL (ref 6.0–8.3)

## 2016-11-22 LAB — AMYLASE: Amylase: 35 U/L (ref 27–131)

## 2016-11-22 NOTE — Progress Notes (Signed)
Subjective:    Patient ID: Cynthia Robertson, female    DOB: 05/25/1991, 26 y.o.   MRN: [id number]  HPI Pt in with some pain in ruq quadrant for about 4 days. Pain was constant for 4 days  but not present for about 5 days. Pain had moderate to severe pain. Pt had lost her appetite and she was not sure if eating made it worse. She was afraid to eat. No nausea or vomiting.   LMP- started yesterday.  Pt thought she was constipated. She was had bowel movement on Wednesday. This was day that pain subsided. Pt states she had 5 days with no bm before she had the bm  past wed. Since last wed daily normal formed bowel movement.  Currently no epigastric pain. She had some pain in rt side and back when had pain in rt upper quadrant region.  Pt husband noted some moles recently on her back.    Review of Systems  Constitutional: Negative for chills, fatigue and fever.  Respiratory: Negative for cough, chest tightness, shortness of breath and wheezing.   Cardiovascular: Negative for chest pain and palpitations.  Gastrointestinal: Positive for abdominal pain. Negative for constipation, nausea and vomiting.       Some recently but resolved.  Musculoskeletal: Negative for back pain.  Skin: Negative for pallor and rash.  Neurological: Negative for dizziness and headaches.  Hematological: Negative for adenopathy. Does not bruise/bleed easily.  Psychiatric/Behavioral: Negative for behavioral problems and confusion.    Past Medical History:  Diagnosis Date  . Anxiety   . GERD (gastroesophageal reflux disease)   . History of chicken pox      Social History   Social History  . Marital status: Married    Spouse name: N/A  . Number of children: N/A  . Years of education: N/A   Occupational History  . Not on file.   Social History Main Topics  . Smoking status: Never Smoker  . Smokeless tobacco: Never Used  . Alcohol use 0.6 - 1.2 oz/week    1 - 2 Standard drinks or equivalent per week   Comment: Occ Beer,Alcohol, stopped drinking April 11th, due to increased panic attacks. Drank excessively after her father committed suicide   . Drug use: No  . Sexual activity: Yes    Partners: Male    Birth control/ protection:      Comment: condoms occ   Other Topics Concern  . 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 WISDOM TOOTH EXTRACTION  04/2012    Family History  Problem Relation Age of Onset  . Hyperlipidemia Mother   . Stroke Maternal Grandfather   . Depression Father   . Alcohol abuse Father   . Suicidality Father     No Known Allergies  Current Outpatient Prescriptions on File Prior to Visit  Medication Sig Dispense Refill  . esomeprazole (NEXIUM) 20 MG capsule Take 20 mg by mouth daily at 12 noon.    . propranolol (INDERAL) 10 MG tablet Take 1 tablet (10 mg total) by mouth daily. Must see the physician for future refills. 30 tablet 2  . sertraline (ZOLOFT) 50 MG tablet Take 1 and 1/2 daily (Patient taking  differently: Take 50 mg by mouth daily. ) 45 tablet 2   No current facility-administered medications on file prior to visit.     BP 119/77 (BP Location: Left Arm, Patient Position: Sitting, Cuff Size: Large)   Pulse 79   Temp 98.1 F (36.7 C) (Oral)   Resp 16   Ht 5\' 8"  (1.727 m)   Wt 248 lb 8 oz (112.7 kg)   LMP 11/22/2016   SpO2 99%   BMI 37.78 kg/m       Objective:   Physical Exam  General Appearance- Not in acute distress.  HEENT Eyes- Scleraeral/Conjuntiva-bilat- Not Yellow. Mouth & Throat- Normal.  Chest and Lung Exam Auscultation: Breath sounds:-Normal. Adventitious sounds:- No Adventitious sounds.  Cardiovascular Auscultation:Rythm - Regular. Heart Sounds -Normal heart sounds.  Abdomen Inspection:-Inspection  Normal.  Palpation/Perucssion: Palpation and Percussion of the abdomen reveal- Non Tender, No Rebound tenderness, No rigidity(Guarding) and No Palpable abdominal masses.  Liver:-Normal.  Spleen:- Normal.   Skin- Left side thorax.(flaky lesion appears to be seborrheic keratosis.Scatter moderate sized modes some exceeding 8 mm size. No rash. No rash on her flank.   Back- no cva tendernss      Assessment & Plan:  For recent intense abdomen pain with radiating pain to your back will get cbc, cmp, amylase, lipase and US abdomen. Get labs today but schedule the [place] later when can be done fasting.  You had constipation recently and that is resolved. Recommend stay hydrated, daily exercise and decrease processed foods. If 3 days pass in future and no bm can try dulcolax or miralax.  For skin lesion will refer you to dermatologist.  Follow up date to be determined after lab review.  [place], Percell Miller, PA-C

## 2016-11-22 NOTE — Patient Instructions (Signed)
For recent intense abdomen pain with radiating pain to your back will get cbc, cmp, amylase, lipase and US abdomen. Get labs today but schedule the [place] later when can be done fasting.  You had constipation recently and that is resolved. Recommend stay hydrated, daily exercise and decrease processed foods. If 3 days pass in future and no bm can try dulcolax or miralax.  For skin lesion will refer you to dermatologist.  Follow up date to be determined after lab review.

## 2016-11-22 NOTE — Progress Notes (Signed)
Pre visit review using our clinic review tool, if applicable. No additional management support is needed unless otherwise documented below in the visit note/SLS  

## 2016-11-23 ENCOUNTER — Telehealth: Payer: Self-pay | Admitting: Medical

## 2016-11-23 DIAGNOSIS — R748 Abnormal levels of other serum enzymes: Secondary | ICD-10-CM

## 2016-11-23 NOTE — Telephone Encounter (Signed)
Noted  

## 2016-11-23 NOTE — Telephone Encounter (Signed)
Future cmp placed. 

## 2016-11-24 ENCOUNTER — Ambulatory Visit: Payer: 59 | Admitting: Certified Nurse Midwife

## 2016-11-29 ENCOUNTER — Ambulatory Visit (HOSPITAL_BASED_OUTPATIENT_CLINIC_OR_DEPARTMENT_OTHER): Payer: 59

## 2016-12-01 ENCOUNTER — Encounter: Payer: Self-pay | Admitting: Certified Nurse Midwife

## 2016-12-01 ENCOUNTER — Ambulatory Visit: Payer: 59 | Admitting: Certified Nurse Midwife

## 2016-12-08 ENCOUNTER — Ambulatory Visit (HOSPITAL_BASED_OUTPATIENT_CLINIC_OR_DEPARTMENT_OTHER): Payer: 59

## 2016-12-16 ENCOUNTER — Ambulatory Visit (HOSPITAL_BASED_OUTPATIENT_CLINIC_OR_DEPARTMENT_OTHER): Payer: 59

## 2017-09-16 ENCOUNTER — Ambulatory Visit (HOSPITAL_COMMUNITY): Payer: 59 | Admitting: Psychiatry

## 2017-09-16 ENCOUNTER — Encounter (HOSPITAL_COMMUNITY): Payer: Self-pay | Admitting: Psychiatry

## 2017-09-16 ENCOUNTER — Other Ambulatory Visit: Payer: Self-pay

## 2017-09-16 DIAGNOSIS — Z818 Family history of other mental and behavioral disorders: Secondary | ICD-10-CM

## 2017-09-16 DIAGNOSIS — R450 Nervousness: Secondary | ICD-10-CM | POA: Diagnosis not present

## 2017-09-16 DIAGNOSIS — F411 Generalized anxiety disorder: Secondary | ICD-10-CM

## 2017-09-16 DIAGNOSIS — R12 Heartburn: Secondary | ICD-10-CM | POA: Diagnosis not present

## 2017-09-16 DIAGNOSIS — F410 Panic disorder [episodic paroxysmal anxiety] without agoraphobia: Secondary | ICD-10-CM | POA: Diagnosis not present

## 2017-09-16 DIAGNOSIS — Z811 Family history of alcohol abuse and dependence: Secondary | ICD-10-CM | POA: Diagnosis not present

## 2017-09-16 MED ORDER — PROPRANOLOL HCL 10 MG PO TABS: 10.0000 mg | ORAL_TABLET | Freq: Every day | ORAL | 1 refills | Status: DC

## 2017-09-16 MED ORDER — SERTRALINE HCL 50 MG PO TABS: 50.0000 mg | ORAL_TABLET | Freq: Every day | ORAL | 1 refills | Status: DC

## 2017-09-16 NOTE — Progress Notes (Signed)
[facility] MD/PA/NP OP Progress Note  09/16/2017 9:44 AM Cynthia Robertson  MRN:  [id number]  Chief Complaint: Anxiety is coming back.  I like to restart the medication.  HPI: Cynthia Robertson is 26 year old Caucasian employed married female.  She was last seen April 2017.  Patient has history of anxiety and panic attacks.  She was taking Zoloft and Inderal however she stopped taking the medication after feeling better.  Now she realized her symptoms are coming back.  She is having panic attacks 2-3 times a week.  She gets very nervous, worried about her future.  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  She will start traveling next year.  She has schedule to visit [place] angina.  She had gained a lot of weight and she is not happy about it.  She is hoping to start weight loss program next week.  She remember when she was taking Zoloft and Inderal but her panic attacks were subsided.  She saw her primary care physician February 2018 and her LFTs were slightly increased and she was recommended ultrasound but she never follow-up.  She admitted emotional eating and she is nervous that she eat more.  Patient denies any suicidal thoughts or any feeling of hopelessness or worthlessness.  She denies any paranoia, hallucination, PTSD or any phobia.  She admitted feeling sad depressed but denies any anhedonia.  She like to go back on her medication.  Patient admitted drinking alcohol on occasions but denies any binge, intoxication or any withdrawal symptoms.  She denies any illegal substance use.  She is married and her husband is very supportive.  She has no children.  Her primary care physician is Dr. Debbrah Alar and she takes Nexium for her acid reflux.  Visit Diagnosis:    ICD-10-CM   1. GAD (generalized anxiety disorder) F41.1 sertraline (ZOLOFT) 50 MG tablet    propranolol (INDERAL) 10 MG tablet    Past  Psychiatric History: Reviewed. Patient has one psychiatric hospitalization to behavioral [place] due to significant anxiety and passive suicidal thoughts.  Patient denies any history of paranoia, hallucination, mania, violence, suicidal attempt.  In the past she had tried Celexa, Lexapro and Xanax from her primary care physician.  She stopped those medication because it did not work.  She had a good response with Zoloft and Inderal.  Past Medical History:  Past Medical History:  Diagnosis Date  . Anxiety   . GERD (gastroesophageal reflux disease)   . History of chicken pox     Past Surgical History:  Procedure Laterality Date  . WISDOM TOOTH EXTRACTION  04/2012    Family Psychiatric History: Reviewed.  Family History:  Family History  Problem Relation Age of Onset  . Hyperlipidemia Mother   . Stroke Maternal Grandfather   . Depression Father   . Alcohol abuse Father   . Suicidality Father     Social History:  Social History   Socioeconomic History  . Marital status: Married    Spouse name: [name]  . Number of children: 0  . Years of education: None  . Highest education level: Some college, no degree  Social Needs  . Financial resource strain: Not hard at all  . Food insecurity - worry: Never true  . Food insecurity - inability: Never true  . Transportation needs - medical: No  . Transportation needs - non-medical: No  Occupational History  Comment: full time  Tobacco Use  . Smoking status: Never Smoker  . Smokeless tobacco: Never Used  Substance and Sexual Activity  . Alcohol use: Yes    Alcohol/week: 2.4 - 5.4 oz    Types: 1 - 2 Standard drinks or equivalent, 3 - 7 Cans of beer per week    Comment: Occ Beer,Alcohol, stopped drinking April 11th, due to increased panic attacks. Drank excessively after her father committed suicide   . Drug use: No  . Sexual activity: Yes    Partners: Male    Birth control/protection: None    Comment: condoms occ  Other  Topics Concern  . 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[employer]   She is studying business at [employer] with her Mom   Enjoys Barrister's clerk, spending time with friends          Allergies: No Known Allergies  Metabolic Disorder Labs: No results found for: HGBA1C, MPG No results found for: PROLACTIN Lab Results  Component Value Date   CHOL 182 03/13/2013   TRIG 213 (H) 03/13/2013   HDL 48 03/13/2013   CHOLHDL 3.8 03/13/2013   VLDL 43 (H) 03/13/2013   LDLCALC 91 03/13/2013   Lab Results  Component Value Date   TSH 3.657 02/11/2013   TSH 3.731 01/22/2013    Therapeutic Level Labs: No results found for: LITHIUM No results found for: VALPROATE No components found for:  CBMZ  Current Medications: Current Outpatient Medications  Medication Sig Dispense Refill  . esomeprazole (NEXIUM) 20 MG capsule Take 20 mg by mouth daily at 12 noon.    . propranolol (INDERAL) 10 MG tablet Take 1 tablet (10 mg total) by mouth daily. Must see the physician for future refills. 30 tablet 1  . sertraline (ZOLOFT) 50 MG tablet Take 1 tablet (50 mg total) by mouth daily. 30 tablet 1   No current facility-administered medications for this visit.      Musculoskeletal: Strength & Muscle Tone: within normal limits Gait & Station: normal Patient leans: N/A  Psychiatric Specialty Exam: Review of Systems  Constitutional: Negative for weight loss.  HENT: Negative.   Respiratory: Negative.   Cardiovascular: Negative.   Gastrointestinal: Positive for heartburn.  Genitourinary: Negative.   Musculoskeletal: Negative.   Skin: Negative.   Neurological: Negative.   Psychiatric/Behavioral: The patient is nervous/anxious.     Blood pressure 117/74, pulse 76, temperature 98.5 F (36.9 C), temperature source Oral, weight 255 lb 9.6 oz (115.9 kg).Body mass index is 38.86 kg/m.  General Appearance: Well Groomed and Obese  Eye Contact:  Good  Speech:  Clear and  Coherent  Volume:  Normal  Mood:  Anxious  Affect:  Congruent  Thought Process:  Goal Directed  Orientation:  Full (Time, Place, and Person)  Thought Content: Logical   Suicidal Thoughts:  No  Homicidal Thoughts:  No  Memory:  Immediate;   Good Recent;   Good Remote;   Good  Judgement:  Good  Insight:  Good  Psychomotor Activity:  Normal  Concentration:  Concentration: Good and Attention Span: Good  Recall:  Good  Fund of Knowledge: Good  Language: Good  Akathisia:  No  Handed:  Right  AIMS (if indicated): not done  Assets:  Communication Skills Desire for Improvement Housing Resilience Social Support Talents/Skills  ADL's:  Intact  Cognition: WNL  Sleep:  Fair   Screenings:   Assessment and Plan: Generalized anxiety disorder.  Panic  attacks.  I reviewed her symptoms, history, collect information and recent blood work results.  Patient decided to have anxiety and panic attacks.  In the past she had a good response with Zoloft and Inderal.  Discussed risk of relapse with a noncompliance of medication.  Patient like to go back on Zoloft and Inderal.  I will start Zoloft 50 mg daily and Adderall 10 mg daily.  I also discussed her weight and patient going to start weight loss program next week.  1 of her liver enzymes are high and I recommended she should consider ultrasound which was recommended by her primary care physician.  Patient agreed with the plan.  Recommended to call us back if she has any question, concern or if you feel worsening of the symptoms.  I will see her again in 6 weeks.  Discussed safety concerns at any time having active suicidal thoughts or homicidal thought that she need to call 911 or go to local emergency room.   [name], MD 09/16/2017, 9:44 AM

## 2017-09-29 ENCOUNTER — Ambulatory Visit: Payer: 59 | Admitting: Certified Nurse Midwife

## 2017-10-28 ENCOUNTER — Ambulatory Visit (HOSPITAL_COMMUNITY): Payer: Self-pay | Admitting: Psychiatry

## 2017-11-08 ENCOUNTER — Ambulatory Visit: Payer: 59 | Admitting: Certified Nurse Midwife

## 2017-11-15 ENCOUNTER — Ambulatory Visit: Payer: 59 | Admitting: Certified Nurse Midwife

## 2017-11-29 ENCOUNTER — Ambulatory Visit: Payer: 59 | Admitting: Certified Nurse Midwife

## 2017-11-29 ENCOUNTER — Telehealth: Payer: Self-pay | Admitting: Certified Nurse Midwife

## 2017-11-29 ENCOUNTER — Encounter: Payer: Self-pay | Admitting: Certified Nurse Midwife

## 2017-11-29 NOTE — Telephone Encounter (Signed)
Patient called after hours yesterday and left a message cancelling her AEX for today. She did not leave a reason for the cancellation. I called her back this morning but could not leave a message as her voice mail is full. She is now in 02 recall.

## 2017-12-19 ENCOUNTER — Other Ambulatory Visit (HOSPITAL_COMMUNITY): Payer: Self-pay

## 2017-12-19 DIAGNOSIS — F411 Generalized anxiety disorder: Secondary | ICD-10-CM

## 2017-12-19 MED ORDER — SERTRALINE HCL 50 MG PO TABS: 50.0000 mg | ORAL_TABLET | Freq: Every day | ORAL | 0 refills | Status: DC

## 2017-12-19 MED ORDER — PROPRANOLOL HCL 10 MG PO TABS: 10.0000 mg | ORAL_TABLET | Freq: Every day | ORAL | 0 refills | Status: DC

## 2018-01-04 ENCOUNTER — Ambulatory Visit (HOSPITAL_COMMUNITY): Payer: 59 | Admitting: Psychiatry

## 2018-02-10 ENCOUNTER — Ambulatory Visit (HOSPITAL_COMMUNITY): Payer: 59 | Admitting: Psychiatry

## 2018-02-13 ENCOUNTER — Ambulatory Visit (HOSPITAL_COMMUNITY): Payer: 59 | Admitting: Psychiatry

## 2018-02-17 ENCOUNTER — Ambulatory Visit (HOSPITAL_COMMUNITY): Payer: 59 | Admitting: Psychiatry

## 2018-02-22 ENCOUNTER — Other Ambulatory Visit (HOSPITAL_COMMUNITY): Payer: Self-pay

## 2018-02-22 DIAGNOSIS — F411 Generalized anxiety disorder: Secondary | ICD-10-CM

## 2018-02-22 MED ORDER — SERTRALINE HCL 50 MG PO TABS: 50.0000 mg | ORAL_TABLET | Freq: Every day | ORAL | 0 refills | Status: DC

## 2018-02-22 MED ORDER — PROPRANOLOL HCL 10 MG PO TABS: 10.0000 mg | ORAL_TABLET | Freq: Every day | ORAL | 0 refills | Status: DC

## 2018-03-09 ENCOUNTER — Ambulatory Visit: Payer: 59 | Admitting: Certified Nurse Midwife

## 2018-03-27 ENCOUNTER — Ambulatory Visit (HOSPITAL_COMMUNITY): Payer: 59 | Admitting: Psychiatry

## 2018-04-03 ENCOUNTER — Other Ambulatory Visit (HOSPITAL_COMMUNITY): Payer: Self-pay

## 2018-04-03 DIAGNOSIS — F411 Generalized anxiety disorder: Secondary | ICD-10-CM

## 2018-04-03 MED ORDER — PROPRANOLOL HCL 10 MG PO TABS: 10.0000 mg | ORAL_TABLET | Freq: Every day | ORAL | 0 refills | Status: DC

## 2018-04-03 MED ORDER — SERTRALINE HCL 50 MG PO TABS: 50.0000 mg | ORAL_TABLET | Freq: Every day | ORAL | 0 refills | Status: DC

## 2018-04-12 ENCOUNTER — Ambulatory Visit: Payer: 59 | Admitting: Certified Nurse Midwife

## 2018-05-09 ENCOUNTER — Encounter (HOSPITAL_COMMUNITY): Payer: Self-pay | Admitting: Psychiatry

## 2018-05-09 ENCOUNTER — Ambulatory Visit (INDEPENDENT_AMBULATORY_CARE_PROVIDER_SITE_OTHER): Payer: 59 | Admitting: Psychiatry

## 2018-05-09 ENCOUNTER — Other Ambulatory Visit (HOSPITAL_COMMUNITY): Payer: Self-pay

## 2018-05-09 DIAGNOSIS — F411 Generalized anxiety disorder: Secondary | ICD-10-CM

## 2018-05-09 MED ORDER — PROPRANOLOL HCL 10 MG PO TABS: 10.0000 mg | ORAL_TABLET | Freq: Every day | ORAL | 1 refills | Status: DC

## 2018-05-09 MED ORDER — SERTRALINE HCL 50 MG PO TABS: 50.0000 mg | ORAL_TABLET | Freq: Every day | ORAL | 2 refills | Status: DC

## 2018-05-09 NOTE — Progress Notes (Signed)
[facility] MD/PA/NP OP Progress Note  05/09/2018 1:47 PM Cynthia Robertson  MRN:  [id number]  Chief Complaint: patient returns for medication management. HPI: I am covering for Dr. Adele Robertson who is out of the office. 19, married, no children, employed. Reports history of chronic , generalized anxiety, described as generalized and excessive worrying. Has been diagnosed with GAD. Endorses occasional panic attacks which have improved overtime, denies agoraphobia. Patient reports that she has been doing relatively well, with less severe anxiety, and has been doing well at home and at work, and she even had a recent promotion at work. She had been off her psychiatric medications for several months but due to worsening symptoms of anxiety restarted earlier this year and has been on them since . ( Zoloft, Propranolol which she takes on as needed basis). Denies side effects.  Of note, denies any substance or alcohol abuse and denies medical illnesses.     Visit Diagnosis: No diagnosis found.  Past Psychiatric History:   Past Medical History:  Past Medical History:  Diagnosis Date  . Anxiety   . GERD (gastroesophageal reflux disease)   . History of chicken pox     Past Surgical History:  Procedure Laterality Date  . WISDOM TOOTH EXTRACTION  04/2012    Family Psychiatric History:  Family History:  Family History  Problem Relation Age of Onset  . Hyperlipidemia Mother   . Stroke Maternal Grandfather   . Depression Father   . Alcohol abuse Father   . Suicidality Father     Social History:  Social History   Socioeconomic History  . Marital status: Married    Spouse name: Cynthia Robertson  . Number of children: 0  . Years of education: Not on file  . Highest education level: Some college, no degree  Occupational History    Comment: full time  Social Needs  . Financial resource strain: Not hard at all  . Food insecurity:    Worry: Never true    Inability: Never true  . Transportation needs:   Medical: No    Non-medical: No  Tobacco Use  . Smoking status: Never Smoker  . Smokeless tobacco: Never Used  Substance and Sexual Activity  . Alcohol use: Yes    Alcohol/week: 2.4 - 5.4 oz    Types: 1 - 2 Standard drinks or equivalent, 3 - 7 Cans of beer per week    Comment: Occ Beer,Alcohol, stopped drinking April 11th, due to increased panic attacks. Drank excessively after her father committed suicide   . Drug use: No  . Sexual activity: Yes    Partners: Male    Birth control/protection: None    Comment: condoms occ  Lifestyle  . Physical activity:    Days per week: 0 days    Minutes per session: 0 min  . Stress: Very much  Relationships  . Social connections:    Talks on phone: More than three times a week    Gets together: Three times a week    Attends religious service: More than 4 times per year    Active member of club or organization: No    Attends meetings of clubs or organizations: Never    Relationship status: Married  Other Topics Concern  . 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[employer]   She is studying business at [employer] with her Mom   Enjoys Barrister's clerk, spending time with friends  Allergies: No Known Allergies  Metabolic Disorder Labs: No results found for: HGBA1C, MPG No results found for: PROLACTIN Lab Results  Component Value Date   CHOL 182 03/13/2013   TRIG 213 (H) 03/13/2013   HDL 48 03/13/2013   CHOLHDL 3.8 03/13/2013   VLDL 43 (H) 03/13/2013   LDLCALC 91 03/13/2013   Lab Results  Component Value Date   TSH 3.657 02/11/2013   TSH 3.731 01/22/2013    Therapeutic Level Labs: No results found for: LITHIUM No results found for: VALPROATE No components found for:  CBMZ  Current Medications: Current Outpatient Medications  Medication Sig Dispense Refill  . esomeprazole (NEXIUM) 20 MG capsule Take 20 mg by mouth daily at 12 noon.    . propranolol (INDERAL) 10 MG tablet Take 1  tablet (10 mg total) by mouth daily. Must see the physician for future refills. 30 tablet 0  . sertraline (ZOLOFT) 50 MG tablet Take 1 tablet (50 mg total) by mouth daily. 30 tablet 0   No current facility-administered medications for this visit.      Musculoskeletal: Strength & Muscle Tone: within normal limits Gait & Station: normal Patient leans: N/A  Psychiatric Specialty Exam: ROS no headache, no chest pain, no shortness of breath, no vomiting , no nausea  Blood pressure 114/83, pulse 92, height 5\' 8"  (1.727 m), weight 112.9 kg (249 lb), SpO2 96 %.Body mass index is 37.86 kg/m.  General Appearance: Well Groomed  Eye Contact:  Good  Speech:  Normal Rate  Volume:  Normal  Mood:  Euthymic  Affect:  Appropriate and Full Range  Thought Process:  Linear and Descriptions of Associations: Intact  Orientation:  Full (Time, Place, and Person)  Thought Content: no hallucinations, no delusions   Suicidal Thoughts:  No denies suicidal or self injurious ideations, denies homicidal or violent ideations  Homicidal Thoughts:  No  Memory:  recent and remote grossly intact   Judgement:  Good  Insight:  Good  Psychomotor Activity:  Normal  Concentration:  Concentration: Good and Attention Span: Good  Recall:  Good  Fund of Knowledge: Good  Language: Good  Akathisia:  Negative  Handed:  Right  AIMS (if indicated): AIMS test not done today  Assets:  Communication Skills Desire for Improvement Resilience Others:  employment  ADL's:  Intact  Cognition: WNL  Sleep:  Good   Screenings:   Assessment and Plan: 27 year old female, reports history of chronic anxiety ( GAD) . Reports she is currently stable, doing well, and states her  Anxiety is now  much improved, although does continue to describe self as " worrier ", and that she has been functioning well in daily activities both at home and work. She is on Zoloft 50 mgrs daily and on Propranolol 10 mgrs QDAY which she is currently taking  as a PRN.  States medications have been helpful and well tolerated . Renew medications- will see in two to three months- agrees to contact clinic sooner if any worsening prior . (Check TSH, which she states had been last checked more than 5 years ago.)   Cynthia Campus, MD 05/09/2018, 1:47 PM

## 2018-05-10 LAB — TSH: TSH: 3.08 u[IU]/mL (ref 0.450–4.500)

## 2018-05-10 LAB — SPECIMEN STATUS REPORT

## 2018-05-16 ENCOUNTER — Ambulatory Visit: Payer: 59 | Admitting: Certified Nurse Midwife

## 2018-05-24 ENCOUNTER — Ambulatory Visit: Payer: 59 | Admitting: Certified Nurse Midwife

## 2018-07-11 ENCOUNTER — Encounter

## 2018-07-11 ENCOUNTER — Ambulatory Visit (HOSPITAL_COMMUNITY): Payer: Self-pay | Admitting: Psychiatry

## 2018-07-11 ENCOUNTER — Ambulatory Visit: Payer: 59 | Admitting: Certified Nurse Midwife

## 2018-08-03 ENCOUNTER — Ambulatory Visit: Payer: 59 | Admitting: Certified Nurse Midwife

## 2018-08-09 ENCOUNTER — Encounter: Payer: Self-pay | Admitting: Certified Nurse Midwife

## 2018-08-09 ENCOUNTER — Other Ambulatory Visit: Payer: Self-pay

## 2018-08-09 ENCOUNTER — Ambulatory Visit: Payer: 59 | Admitting: Certified Nurse Midwife

## 2018-08-09 ENCOUNTER — Other Ambulatory Visit (HOSPITAL_COMMUNITY)
Admission: RE | Admit: 2018-08-09 | Discharge: 2018-08-09 | Disposition: A | Discharge status: Home / Self Care | Payer: 59 | Source: Ambulatory Visit | Attending: Obstetrics & Gynecology | Admitting: Obstetrics & Gynecology

## 2018-08-09 VITALS — BP 118/70 | HR 70 | Resp 16 | Ht 68.25 in | Wt 245.0 lb

## 2018-08-09 DIAGNOSIS — E663 Overweight: Secondary | ICD-10-CM

## 2018-08-09 DIAGNOSIS — Z113 Encounter for screening for infections with a predominantly sexual mode of transmission: Secondary | ICD-10-CM

## 2018-08-09 DIAGNOSIS — Z124 Encounter for screening for malignant neoplasm of cervix: Secondary | ICD-10-CM | POA: Insufficient documentation

## 2018-08-09 DIAGNOSIS — Z0000 Encounter for general adult medical examination without abnormal findings: Secondary | ICD-10-CM

## 2018-08-09 DIAGNOSIS — Z01419 Encounter for gynecological examination (general) (routine) without abnormal findings: Secondary | ICD-10-CM | POA: Diagnosis not present

## 2018-08-09 NOTE — Progress Notes (Signed)
27 y.o. G0P0 Married  Caucasian Fe here for annual exam Periods normal, no missed periods in last year. Has been working on weight control with decreased intake of carbohydrates. Dr. Fredderick Severance manages Zoloft and Inderal(prn) sees him every 6 month for medication management. Working well. No STD concerns, but would like screening and screening labs if needed. Contraception working well. May plan pregnancy in a few years. No other health issues today.  Patient's last menstrual period was 07/29/2018 (exact date).          Sexually active: Yes.    The current method of family planning is withdrawal.    Exercising: No.  exercise Smoker:  no  Review of Systems  Constitutional: Negative.   HENT: Negative.   Eyes: Negative.   Respiratory: Negative.   Cardiovascular: Negative.   Gastrointestinal: Negative.   Genitourinary: Negative.   Musculoskeletal: Negative.   Skin: Negative.   Neurological: Negative.   Endo/Heme/Allergies: Negative.   Psychiatric/Behavioral: Negative.     Health Maintenance: Pap:  01-22-13 neg HPV HR neg, 10-17-15 neg. History of Abnormal Pap: no MMG:  none Self Breast exams: no Colonoscopy:  none BMD:   none TDaP:  unsure Shingles: no Pneumonia: no Hep C and HIV: both neg 2017 Labs: if needed   reports that she has never smoked. She has never used smokeless tobacco. She reports that she drinks about 5.0 - 10.0 standard drinks of alcohol per week. She reports that she does not use drugs.  Past Medical History:  Diagnosis Date  . Anxiety   . GERD (gastroesophageal reflux disease)   . History of chicken pox     Past Surgical History:  Procedure Laterality Date  . WISDOM TOOTH EXTRACTION  04/2012    Current Outpatient Medications  Medication Sig Dispense Refill  . esomeprazole (NEXIUM) 20 MG capsule Take 20 mg by mouth daily at 12 noon.    . propranolol (INDERAL) 10 MG tablet Take 1 tablet (10 mg total) by mouth daily. Must see the physician for future refills.  (Patient taking differently: Take 10 mg by mouth as needed. Must see the physician for future refills.) 30 tablet 1  . sertraline (ZOLOFT) 50 MG tablet Take 1 tablet (50 mg total) by mouth daily. 30 tablet 2   No current facility-administered medications for this visit.     Family History  Problem Relation Age of Onset  . Hyperlipidemia Mother   . Stroke Maternal Grandfather   . Depression Father   . Alcohol abuse Father   . Suicidality Father     ROS:  Pertinent items are noted in HPI.  Otherwise, a comprehensive ROS was negative.  Exam:   BP 118/70   Pulse 70   Resp 16   Ht 5' 8.25" (1.734 m)   Wt 245 lb (111.1 kg)   LMP 07/29/2018 (Exact Date)   BMI 36.98 kg/m  Height: 5' 8.25" (173.4 cm) Ht Readings from Last 3 Encounters:  08/09/18 5' 8.25" (1.734 m)  11/22/16 5\' 8"  (1.727 m)  10/17/15 5' 8.25" (1.734 m)    General appearance: alert, cooperative and appears stated age Head: Normocephalic, without obvious abnormality, atraumatic Neck: no adenopathy, supple, symmetrical, trachea midline and thyroid normal to inspection and palpation Lungs: clear to auscultation bilaterally Breasts: normal appearance, no masses or tenderness, No nipple retraction or dimpling, No nipple discharge or bleeding, No axillary or supraclavicular adenopathy Heart: regular rate and rhythm Abdomen: soft, non-tender; no masses,  no organomegaly Extremities: extremities normal, atraumatic, no cyanosis  or edema Skin: Skin color, texture, turgor normal. No rashes or lesions Lymph nodes: Cervical, supraclavicular, and axillary nodes normal. No abnormal inguinal nodes palpated Neurologic: Grossly normal   Pelvic: External genitalia:  no lesions              Urethra:  normal appearing urethra with no masses, tenderness or lesions              Bartholin's and Skene's: normal                 Vagina: normal appearing vagina with normal color and discharge, no lesions              Cervix: no cervical  motion tenderness, no lesions and nulliparous appearance              Pap taken: Yes.   Bimanual Exam:  Uterus:  normal size, contour, position, consistency, mobility, non-tender and anteverted              Adnexa: normal adnexa and no mass, fullness, tenderness               Rectovaginal: Confirms               Anus:  normal appearance, no lesions  Chaperone present: yes  A:  Well Woman with normal exam  Contraception withdrawal/ none  Overweight with weight gain  Anxiety/depression with MD management all stable  Screening labs  P:   Reviewed health and wellness pertinent to exam  Encouraged to start on Prenatal vitamins if becomes pregnant  Discussed portion control and exercise for better health. Patient and spouse are working on.  Continue follow up with MD as indicated  Labs: Affirm, Gc/chlamydia, STD panel, Hep C, Lipid panel, CMP  Pap smear: yes   counseled on breast self exam, STD prevention, HIV risk factors and prevention, feminine hygiene, adequate intake of calcium and vitamin D, diet and exercise  return annually or prn  An After Visit Summary was printed and given to the patient.

## 2018-08-10 ENCOUNTER — Ambulatory Visit (HOSPITAL_COMMUNITY): Payer: 59 | Admitting: Psychiatry

## 2018-08-10 LAB — HEP, RPR, HIV PANEL
HIV Screen 4th Generation wRfx: NONREACTIVE
Hepatitis B Surface Ag: NEGATIVE
RPR Ser Ql: NONREACTIVE

## 2018-08-10 LAB — COMPREHENSIVE METABOLIC PANEL
ALBUMIN: 4.3 g/dL (ref 3.5–5.5)
ALK PHOS: 83 IU/L (ref 39–117)
ALT: 20 IU/L (ref 0–32)
AST: 35 IU/L (ref 0–40)
Albumin/Globulin Ratio: 1.8 (ref 1.2–2.2)
BILIRUBIN TOTAL: 0.4 mg/dL (ref 0.0–1.2)
BUN/Creatinine Ratio: 8 — ABNORMAL LOW (ref 9–23)
BUN: 8 mg/dL (ref 6–20)
CO2: 22 mmol/L (ref 20–29)
Calcium: 9.3 mg/dL (ref 8.7–10.2)
Chloride: 103 mmol/L (ref 96–106)
Creatinine, Ser: 0.95 mg/dL (ref 0.57–1.00)
GFR calc Af Amer: 94 mL/min/{1.73_m2} (ref >59)
GFR calc non Af Amer: 82 mL/min/{1.73_m2} (ref >59)
GLOBULIN, TOTAL: 2.4 g/dL (ref 1.5–4.5)
GLUCOSE: 95 mg/dL (ref 65–99)
Potassium: 4.3 mmol/L (ref 3.5–5.2)
Sodium: 140 mmol/L (ref 134–144)
Total Protein: 6.7 g/dL (ref 6.0–8.5)

## 2018-08-10 LAB — CYTOLOGY - PAP: Diagnosis: NEGATIVE

## 2018-08-10 LAB — LIPID PANEL
Chol/HDL Ratio: 3.2 ratio (ref 0.0–4.4)
Cholesterol, Total: 164 mg/dL (ref 100–199)
HDL: 51 mg/dL (ref >39)
LDL Calculated: 70 mg/dL (ref 0–99)
Triglycerides: 216 mg/dL — ABNORMAL HIGH (ref 0–149)
VLDL CHOLESTEROL CAL: 43 mg/dL — AB (ref 5–40)

## 2018-08-10 LAB — VAGINITIS/VAGINOSIS, DNA PROBE
CANDIDA SPECIES: NEGATIVE
Gardnerella vaginalis: POSITIVE — AB
TRICHOMONAS VAG: NEGATIVE

## 2018-08-10 LAB — HEPATITIS C ANTIBODY

## 2018-08-11 ENCOUNTER — Other Ambulatory Visit: Payer: Self-pay

## 2018-08-11 LAB — GC/CHLAMYDIA PROBE AMP
CHLAMYDIA, DNA PROBE: NEGATIVE
Neisseria gonorrhoeae by PCR: NEGATIVE

## 2018-08-11 MED ORDER — METRONIDAZOLE 0.75 % VA GEL: 1.0000 | Freq: Two times a day (BID) | VAGINAL | 0 refills | Status: AC

## 2018-08-18 ENCOUNTER — Other Ambulatory Visit: Payer: Self-pay | Admitting: Certified Nurse Midwife

## 2018-08-18 DIAGNOSIS — R899 Unspecified abnormal finding in specimens from other organs, systems and tissues: Secondary | ICD-10-CM

## 2018-08-25 ENCOUNTER — Telehealth: Payer: Self-pay | Admitting: Certified Nurse Midwife

## 2018-08-25 NOTE — Telephone Encounter (Signed)
Patient states she was seen recently and treated with a "gel" for her bacterial infection. Patient is still having symptoms and is asking to talk with a nurse. I told her she may need to come in for an appointment today. She said " I am unable to leave work today".

## 2018-08-25 NOTE — Telephone Encounter (Signed)
Agreeable with plan

## 2018-08-25 NOTE — Telephone Encounter (Signed)
Spoke with patient.  She is using Metrogel currently.  Having white "clumpy" discharge.  No vaginal itching.  No hx of yeast.  No vaginal odor. No vaginal discharge.  Advised patient that discharge is the Metrogel that can "collect" in vagina due to use every night. She has one more dose left. Self care instructions given,  Advised to keep area clean and dry. Change Damp clothes as soon as possible. White cotton underwear is best. Can also try Benjamin for relief of external irritation, if any.  Will continue to use as directed and then will call back if any symptoms continue.  Patient agreeable to plan.  Advised will call back if Cynthia Robertson CNM has any further advise or instructions.  Routing to provider and will close.

## 2018-09-04 NOTE — Progress Notes (Deleted)
27 y.o. Married {Race/ethnicity:[id number]} female G0P0 here with complaint of UTI, with onset  on ***. Patient complaining of urinary frequency/urgency/ and pain with urination. Patient denies fever, chills, nausea or back pain. No new personal products. Patient feels *** to sexual activity. Denies any vaginal symptoms.    Contraception is ***. Menopausal with vaginal dryness. Patient *** adequate water intake.  ROS  O: Healthy female WDWN Affect: Normal, orientation x 3 Skin : warm and dry CVAT: *** bilateral Abdomen: *** for suprapubic tenderness  Pelvic exam: External genital area: normal, no lesions Bladder,Urethra tender, Urethral meatus: tender, red Vagina: normal vaginal discharge, normal appearance  Wet prep *** Cervix: normal, non tender Uterus:normal,non tender Adnexa: normal non tender, no fullness or masses   A: UTI Normal pelvic exam  P: Reviewed findings of UTI and need for treatment. Rx: PQD:IYMEB micro, culture Reviewed warning signs and symptoms of UTI and need to advise if occurring. Encouraged to limit soda, tea, and coffee and be sure to increase water intake.   RV prn

## 2018-09-05 ENCOUNTER — Ambulatory Visit: Payer: 59 | Admitting: Certified Nurse Midwife

## 2018-09-05 ENCOUNTER — Other Ambulatory Visit: Payer: Self-pay

## 2018-09-05 ENCOUNTER — Encounter: Payer: Self-pay | Admitting: Certified Nurse Midwife

## 2018-09-05 ENCOUNTER — Telehealth: Payer: Self-pay | Admitting: Certified Nurse Midwife

## 2018-09-05 VITALS — BP 120/64 | HR 68 | Temp 98.6°F | Resp 16 | Wt 248.0 lb

## 2018-09-05 DIAGNOSIS — N898 Other specified noninflammatory disorders of vagina: Secondary | ICD-10-CM | POA: Diagnosis not present

## 2018-09-05 DIAGNOSIS — N390 Urinary tract infection, site not specified: Secondary | ICD-10-CM | POA: Diagnosis not present

## 2018-09-05 LAB — POCT URINALYSIS DIPSTICK
Bilirubin, UA: NEGATIVE
Blood, UA: NEGATIVE
Glucose, UA: NEGATIVE
Ketones, UA: NEGATIVE
Leukocytes, UA: NEGATIVE
NITRITE UA: NEGATIVE
PROTEIN UA: NEGATIVE
Urobilinogen, UA: NEGATIVE E.U./dL — AB
pH, UA: 5 (ref 5.0–8.0)

## 2018-09-05 MED ORDER — NITROFURANTOIN MONOHYD MACRO 100 MG PO CAPS: ORAL_CAPSULE | ORAL | 0 refills | Status: DC

## 2018-09-05 NOTE — Telephone Encounter (Signed)
Erroneous encounter

## 2018-09-05 NOTE — Progress Notes (Signed)
27 y.o. Married Caucasian female G0P0 here with complaint of UTI, with onset  on 3 days. Patient complaining of urinary frequency/urgency/ and dribble amount and pain with urination. Patient denies fever, chills, nausea or back pain. No new personal products. Patient feels not related to sexual activity. Denies any vaginal itching or change in discharge except for Metrogel residue for BV. Completed medication 4 days ago.    Contraception is none. LMP 06/30/18  Review of Systems  Constitutional: Negative.   HENT: Negative.   Eyes: Negative.   Respiratory: Negative.   Cardiovascular: Negative.   Gastrointestinal: Negative.   Genitourinary: Positive for dysuria and urgency.       Vaginal itching  Musculoskeletal: Negative.   Skin: Negative.   Neurological: Negative.   Endo/Heme/Allergies: Negative.   Psychiatric/Behavioral: Negative.     O: Healthy female WDWN Affect: Normal, orientation x 3 Skin : warm and dry CVAT: negative bilateral Abdomen: positive for suprapubic tenderness  Pelvic exam: External genital area: normal, no lesions Bladder,Urethra  Non tender, Urethral meatus: tender, red Vagina: odorousl vaginal discharge, normal appearance Affirm taken Cervix: normal, non tender Uterus:normal,non tender Adnexa: normal non tender, no fullness or masses   A: UTI Normal pelvic exam poct urine-neg, strong urine odor R/O vaginal infection , recent treatment for BV with Metrogel  P: Reviewed findings of UTI and need for treatment. [id number] see order with instructions [id number]  culture Reviewed warning signs and symptoms of UTI and need to advise if occurring. Encouraged to limit soda, tea, and coffee and be sure to increase water intake. Lab: affirm will treat if  Indicated. Comfort measures given for discharge or odor.  Rv prn   RV prn

## 2018-09-06 ENCOUNTER — Other Ambulatory Visit: Payer: Self-pay

## 2018-09-06 ENCOUNTER — Ambulatory Visit: Payer: 59 | Admitting: Certified Nurse Midwife

## 2018-09-06 LAB — VAGINITIS/VAGINOSIS, DNA PROBE
Candida Species: NEGATIVE
GARDNERELLA VAGINALIS: POSITIVE — AB
TRICHOMONAS VAG: NEGATIVE

## 2018-09-06 MED ORDER — METRONIDAZOLE 500 MG PO TABS: 500.0000 mg | ORAL_TABLET | Freq: Two times a day (BID) | ORAL | 0 refills | Status: DC

## 2018-09-07 LAB — URINE CULTURE

## 2018-09-12 ENCOUNTER — Other Ambulatory Visit: Payer: 59

## 2018-09-15 ENCOUNTER — Ambulatory Visit (HOSPITAL_COMMUNITY): Payer: 59 | Admitting: Psychiatry

## 2018-09-16 ENCOUNTER — Ambulatory Visit (HOSPITAL_COMMUNITY): Payer: 59 | Admitting: Psychiatry

## 2018-09-20 ENCOUNTER — Telehealth: Payer: Self-pay | Admitting: Certified Nurse Midwife

## 2018-09-20 DIAGNOSIS — N390 Urinary tract infection, site not specified: Secondary | ICD-10-CM

## 2018-09-20 MED ORDER — NITROFURANTOIN MONOHYD MACRO 100 MG PO CAPS: ORAL_CAPSULE | ORAL | 0 refills | Status: DC

## 2018-09-20 NOTE — Telephone Encounter (Signed)
Spoke with patient. Treated for Ecoli UTI on 11/26 with macrobid 500mg  bid x7d. Patient states she has just recently completed medication, did not take on regular basis. Currently taking flagyl PO for BV.    Patient reports symptoms have improved, not resolved. Reports urgency and  intermittent dysuria. Denies fever/chills, lower back pain, N/V, blood in urine. Patient declined OV, is flying out today for [place]. Advised I will review with Melvia Heaps, CNM and return call. Patient agreeable.   Melvia Heaps, CNM -please advise.

## 2018-09-20 NOTE — Telephone Encounter (Signed)
Patient finished her antibiotics and is still having uti symptoms.  I asked her if she could come in for an appointment today. She is not available for an appointment.

## 2018-09-20 NOTE — Telephone Encounter (Signed)
Reviewed with Melvia Heaps, CNM. Extend Macrobid 100mg  bid x 3 days. Increase water intake.   Call returned to patient, advised as seen below per Melvia Heaps, CNM. Rx for macrobid to verified pharmacy. Patient verbalizes understanding and is agreeable.   Encounter closed.

## 2018-09-27 ENCOUNTER — Encounter (HOSPITAL_COMMUNITY): Payer: Self-pay | Admitting: Emergency Medicine

## 2018-09-27 ENCOUNTER — Emergency Department (HOSPITAL_COMMUNITY)
Admission: EM | Admit: 2018-09-27 | Discharge: 2018-09-27 | Disposition: A | Discharge status: Home / Self Care | Payer: 59 | Attending: Emergency Medicine | Admitting: Emergency Medicine

## 2018-09-27 ENCOUNTER — Other Ambulatory Visit: Payer: Self-pay

## 2018-09-27 ENCOUNTER — Emergency Department (HOSPITAL_COMMUNITY): Payer: 59

## 2018-09-27 DIAGNOSIS — R079 Chest pain, unspecified: Secondary | ICD-10-CM

## 2018-09-27 DIAGNOSIS — Z79899 Other long term (current) drug therapy: Secondary | ICD-10-CM | POA: Diagnosis not present

## 2018-09-27 DIAGNOSIS — R0789 Other chest pain: Secondary | ICD-10-CM | POA: Diagnosis present

## 2018-09-27 LAB — BASIC METABOLIC PANEL
Anion gap: 12 (ref 5–15)
BUN: 8 mg/dL (ref 6–20)
CALCIUM: 9.1 mg/dL (ref 8.9–10.3)
CO2: 21 mmol/L — ABNORMAL LOW (ref 22–32)
CREATININE: 1.01 mg/dL — AB (ref 0.44–1.00)
Chloride: 106 mmol/L (ref 98–111)
GFR calc Af Amer: >60 mL/min (ref >60)
GFR calc non Af Amer: >60 mL/min (ref >60)
Glucose, Bld: 100 mg/dL — ABNORMAL HIGH (ref 70–99)
Potassium: 3.5 mmol/L (ref 3.5–5.1)
Sodium: 139 mmol/L (ref 135–145)

## 2018-09-27 LAB — CBC WITH DIFFERENTIAL/PLATELET
Abs Immature Granulocytes: 0.01 10*3/uL (ref 0.00–0.07)
BASOS PCT: 1 %
Basophils Absolute: 0 10*3/uL (ref 0.0–0.1)
Eosinophils Absolute: 0.1 10*3/uL (ref 0.0–0.5)
Eosinophils Relative: 1 %
HCT: 39.3 % (ref 36.0–46.0)
Hemoglobin: 12.2 g/dL (ref 12.0–15.0)
Immature Granulocytes: 0 %
Lymphocytes Relative: 27 %
Lymphs Abs: 1.8 10*3/uL (ref 0.7–4.0)
MCH: 28.1 pg (ref 26.0–34.0)
MCHC: 31 g/dL (ref 30.0–36.0)
MCV: 90.6 fL (ref 80.0–100.0)
Monocytes Absolute: 0.5 10*3/uL (ref 0.1–1.0)
Monocytes Relative: 7 %
NRBC: 0 % (ref 0.0–0.2)
Neutro Abs: 4.1 10*3/uL (ref 1.7–7.7)
Neutrophils Relative %: 64 %
Platelets: 286 10*3/uL (ref 150–400)
RBC: 4.34 MIL/uL (ref 3.87–5.11)
RDW: 14.4 % (ref 11.5–15.5)
WBC: 6.5 10*3/uL (ref 4.0–10.5)

## 2018-09-27 LAB — I-STAT TROPONIN, ED
Troponin i, poc: 0.01 ng/mL (ref 0.00–0.08)
Troponin i, poc: 0 ng/mL (ref 0.00–0.08)

## 2018-09-27 LAB — D-DIMER, QUANTITATIVE: D-Dimer, Quant: 0.39 ug/mL-FEU (ref 0.00–0.50)

## 2018-09-27 MED ORDER — SODIUM CHLORIDE 0.9 % IV BOLUS
1000.0000 mL | Freq: Once | INTRAVENOUS | Status: AC
  Administered 2018-09-27: 1000 mL via INTRAVENOUS

## 2018-09-27 MED ORDER — HYDROXYZINE HCL 10 MG PO TABS
10.0000 mg | ORAL_TABLET | Freq: Once | ORAL | Status: AC
  Administered 2018-09-27: 10 mg via ORAL
  Filled 2018-09-27: qty 1

## 2018-09-27 MED ORDER — HYDROXYZINE HCL 25 MG PO TABS: 12.5000 mg | ORAL_TABLET | Freq: Four times a day (QID) | ORAL | 0 refills | Status: DC | PRN

## 2018-09-27 NOTE — ED Provider Notes (Signed)
[facility] EMERGENCY DEPARTMENT Provider Note   CSN: [id number] Arrival date & time: 09/27/18  5427     History   Chief Complaint Chief Complaint  Patient presents with  . Chest Pain    HPI Cynthia Robertson is a 27 y.o. female with a hx of anxiety, GERD, and panic disorder who presents to the ED with complaints of intermittent chest pain x 4 days. Patient states pain is located to the bilateral anterior chest and feels like a burning sensation. This occurs up to 10 times per day and lasts 10-15 minutes each time w/ spontaneous resolution. Pain has associated anxiety and occasional palpitations. She states only trigger is thinking about it, otherwise no specific triggers or alleviating/aggravating factors- not exertional, pleuritic, or positional. Does not change with eating. She states she has a hx of similar pain 4-5 years ago that was attibuted to anxiety. She was previously on Zoloft but discontinued this 8 months ago. She takes PRN propranolol- tried this Monday for sxs without much change. Denies fever, chills, cough, nausea, vomiting, or diaphoresis. Denies leg pain/swelling, hemoptysis, recent surgery/trauma, recent long travel, hormone use, personal hx of cancer, or hx of DVT/PE.    HPI  Past Medical History:  Diagnosis Date  . Anxiety   . GERD (gastroesophageal reflux disease)   . History of chicken pox     Patient Active Problem List   Diagnosis Date Noted  . Right otitis media 10/22/2013  . Panic disorder without agoraphobia 03/06/2013  . GERD (gastroesophageal reflux disease) 02/14/2013  . Sinus tachycardia 02/12/2013  . Generalized anxiety disorder 01/22/2013    Past Surgical History:  Procedure Laterality Date  . WISDOM TOOTH EXTRACTION  04/2012     OB History    Gravida  0   Para      Term      Preterm      AB      Living        SAB      TAB      Ectopic      Multiple      Live Births               Home  Medications    Prior to Admission medications   Medication Sig Start Date End Date Taking? Authorizing Provider  esomeprazole (NEXIUM) 20 MG capsule Take 20 mg by mouth daily at 12 noon.    [provider]  metroNIDAZOLE (FLAGYL) 500 MG tablet Take 1 tablet (500 mg total) by mouth 2 (two) times daily. 09/06/18   Regina Eck, CNM  nitrofurantoin, macrocrystal-monohydrate, (MACROBID) 100 MG capsule Take one capsule BID x 3 days 09/20/18   Regina Eck, CNM  propranolol (INDERAL) 10 MG tablet Take 1 tablet (10 mg total) by mouth daily. Must see the physician for future refills. Patient taking differently: Take 10 mg by mouth as needed. Must see the physician for future refills. 05/09/18   Cobos, Myer Peer, MD  sertraline (ZOLOFT) 50 MG tablet Take 1 tablet (50 mg total) by mouth daily. 05/09/18   Cobos, Myer Peer, MD    Family History Family History  Problem Relation Age of Onset  . Hyperlipidemia Mother   . Stroke Maternal Grandfather   . Depression Father   . Alcohol abuse Father   . Suicidality Father     Social History Social History   Tobacco Use  . Smoking status: Never Smoker  . Smokeless tobacco: Never Used  Substance Use Topics  . Alcohol use: Yes    Alcohol/week: 5.0 - 10.0 standard drinks    Types: 5 - 10 Standard drinks or equivalent per week  . Drug use: No     Allergies   Patient has no known allergies.   Review of Systems Review of Systems  Constitutional: Negative for chills and fever.  Respiratory: Negative for cough and shortness of breath.   Cardiovascular: Positive for chest pain and palpitations. Negative for leg swelling.  Gastrointestinal: Negative for nausea and vomiting.  Musculoskeletal: Negative for myalgias.  Neurological: Negative for dizziness, syncope, weakness, light-headedness, numbness and headaches.  All other systems reviewed and are negative.    Physical Exam Updated Vital Signs BP (!) 144/86   Pulse (!)  107   Temp 97.7 F (36.5 C) (Oral)   Resp (!) 21   Ht 5\' 8"  (1.727 m)   Wt 108.9 kg   LMP 08/30/2018 (Exact Date)   SpO2 98%   BMI 36.49 kg/m   Physical Exam Vitals signs and nursing note reviewed.  Constitutional:      General: She is not in acute distress.    Appearance: She is well-developed. She is not toxic-appearing.  HENT:     Head: Normocephalic and atraumatic.  Eyes:     General:        Right eye: No discharge.        Left eye: No discharge.     Conjunctiva/sclera: Conjunctivae normal.  Neck:     Musculoskeletal: Neck supple.  Cardiovascular:     Rate and Rhythm: Regular rhythm. Tachycardia present.     Pulses:          Radial pulses are 2+ on the right side and 2+ on the left side.     Heart sounds: No murmur. No friction rub.  Pulmonary:     Effort: Pulmonary effort is normal. No respiratory distress.     Breath sounds: Normal breath sounds. No wheezing, rhonchi or rales.  Abdominal:     General: There is no distension.     Palpations: Abdomen is soft.     Tenderness: There is no abdominal tenderness.  Musculoskeletal:     Right lower leg: No edema.     Left lower leg: No edema.  Skin:    General: Skin is warm and dry.     Capillary Refill: Capillary refill takes less than 2 seconds.     Findings: No rash.  Neurological:     Mental Status: She is alert.     Comments: Clear speech.   Psychiatric:        Mood and Affect: Mood is anxious.        Thought Content: Thought content does not include homicidal or suicidal ideation.    ED Treatments / Results  Labs (all labs ordered are listed, but only abnormal results are displayed) Labs Reviewed  BASIC METABOLIC PANEL - Abnormal; Notable for the following components:      Result Value   CO2 21 (*)    Glucose, Bld 100 (*)    Creatinine, Ser 1.01 (*)    All other components within normal limits  CBC WITH DIFFERENTIAL/PLATELET  D-DIMER, QUANTITATIVE (NOT AT [facility])  I-STAT TROPONIN, ED    EKG EKG  Interpretation  Date/Time:  Wednesday September 27 2018 06:27:19 EST Ventricular Rate:  103 PR Interval:    QRS Duration: 107 QT Interval:  330 QTC Calculation: 432 R Axis:   22 Text Interpretation:  Sinus  tachycardia RSR' in V1 or V2, probably normal variant Confirmed by Thayer Jew [phone]) on 09/27/2018 6:39:41 AM   Radiology No results found.  Procedures Procedures (including critical care time)  Medications Ordered in ED Medications  sodium chloride 0.9 % bolus 1,000 mL (has no administration in time range)  hydrOXYzine (ATARAX/VISTARIL) tablet 10 mg (has no administration in time range)     Initial Impression / Assessment and Plan / ED Course  I have reviewed the triage vital signs and the nursing notes.  Pertinent labs & imaging results that were available during my care of the patient were reviewed by me and considered in my medical decision making (see chart for details).    Patient presents to the emergency department with chest pain. Patient nontoxic appearing, in no apparent distress, vitals notable for tachycardia and HTN. Patient appears anxious w/ tachycardia on exam otherwise benign physical. DDX: ACS, pulmonary embolism, dissection, pneumothorax, effusion, infiltrate,  anemia, electrolyte derangement, MSK, anxiety, GERD. Evaluation initiated with labs including troponin & d-dimer, EKG, and CXR. Patient on cardiac monitor. Fluids & hydroxyzine ordered.   Work-up in the ER unremarkable. Labs reviewed, no leukocytosis, anemia, or significant electrolyte abnormality. Creatinine slightly elevated at 1.01 today, prior on record from 1 month ago was 0.95- PCP recheck. CXR without infiltrate, effusion, pneumothorax, or fracture/dislocation.   Low risk heart score, EKG without obvious ischemia, delta troponin negative, doubt ACS. Patient is low risk wells, she was not PERC negative secondary to tachycardia- d-dimer obtained & negative, doubt pulmonary embolism. Pain is  not a tearing sensation, symmetric pulses, no widening of mediastinum on CXR, doubt dissection.  Patient has appeared hemodynamically stable throughout ER visit and appears safe for discharge with close PCP follow up. She is feeling better following fluids & atarax and her vitals have normalized. Unclear definitive etiology however anxiety seems likely- will provide PRN atarax. I discussed results, treatment plan, need for PCP follow-up, and return precautions with the patient. Provided opportunity for questions, patient confirmed understanding and is in agreement with plan.   Vitals:   09/27/18 0900 09/27/18 1006  BP: 124/74 124/62  Pulse: 79 81  Resp: 19 20  Temp:    SpO2: 97% 98%    Final Clinical Impressions(s) / ED Diagnoses   Final diagnoses:  Chest pain, unspecified type    ED Discharge Orders         Ordered    hydrOXYzine (ATARAX/VISTARIL) 25 MG tablet  Every 6 hours PRN     09/27/18 [address], [place] R, PA-C 09/27/18 1126    Merryl Hacker, MD 09/30/18 2253

## 2018-09-27 NOTE — ED Notes (Signed)
Patient verbalizes understanding of discharge instructions. Opportunity for questioning and answers were provided. Pt discharged from ED. 

## 2018-09-27 NOTE — ED Notes (Signed)
Patient transported to X-ray 

## 2018-09-27 NOTE — Discharge Instructions (Addendum)
You were seen in the emergency department today for chest pain. Your work-up in the emergency department has been overall reassuring. Your labs have been fairly normal and or similar to previous blood work you have had done. Your EKG and the enzyme we use to check your heart did not show an acute heart attack at this time. Your chest x-ray was normal. Your lab test we use to look for a blood clot was normal.   We are prescribing you hydroxyzine to help with your symptoms- please take this as needed, you may take 1-2 tablets every 6 hours.   We have prescribed you new medication(s) today. Discuss the medications prescribed today with your pharmacist as they can have adverse effects and interactions with your other medicines including over the counter and prescribed medications. Seek medical evaluation if you start to experience new or abnormal symptoms after taking one of these medicines, seek care immediately if you start to experience difficulty breathing, feeling of your throat closing, facial swelling, or rash as these could be indications of a more serious allergic reaction   We would like you to follow up closely with your primary care provider within 1-3 days. Return to the ER immediately should you experience any new or worsening symptoms including but not limited to return of pain, worsened pain, vomiting, shortness of breath, dizziness, lightheadedness, passing out, or any other concerns that you may have.

## 2018-09-27 NOTE — ED Triage Notes (Signed)
Pt report intermittent CP since Sunday. Hx of similar in past with no addition s/sx. Hx of anxiety. Pt ambulatory to room and in NAD.

## 2018-09-28 ENCOUNTER — Telehealth: Payer: Self-pay | Admitting: *Deleted

## 2018-09-28 NOTE — Telephone Encounter (Signed)
No answer

## 2018-10-03 ENCOUNTER — Other Ambulatory Visit (HOSPITAL_COMMUNITY): Payer: Self-pay

## 2018-10-03 DIAGNOSIS — F411 Generalized anxiety disorder: Secondary | ICD-10-CM

## 2018-10-03 MED ORDER — PROPRANOLOL HCL 10 MG PO TABS: 10.0000 mg | ORAL_TABLET | ORAL | 0 refills | Status: DC | PRN

## 2018-10-03 MED ORDER — SERTRALINE HCL 50 MG PO TABS: 50.0000 mg | ORAL_TABLET | Freq: Every day | ORAL | 1 refills | Status: DC

## 2018-10-12 ENCOUNTER — Other Ambulatory Visit: Payer: 59

## 2018-10-23 ENCOUNTER — Other Ambulatory Visit: Payer: 59

## 2018-10-23 ENCOUNTER — Telehealth: Payer: Self-pay | Admitting: Certified Nurse Midwife

## 2018-10-23 NOTE — Telephone Encounter (Signed)
Patient called and cancelled her fasting lab appointment for today due to work. She said she will call back later today to reschedule. Routing to provider for FYI only.

## 2018-11-02 ENCOUNTER — Other Ambulatory Visit: Payer: Self-pay

## 2018-11-06 ENCOUNTER — Encounter: Payer: Self-pay | Admitting: Family

## 2018-11-10 ENCOUNTER — Encounter: Payer: Self-pay | Admitting: Family

## 2018-11-10 ENCOUNTER — Other Ambulatory Visit: Payer: Self-pay

## 2018-11-16 ENCOUNTER — Telehealth: Payer: Self-pay | Admitting: Certified Nurse Midwife

## 2018-11-16 NOTE — Telephone Encounter (Signed)
No answer, voicemail full, unable to leave message.

## 2018-11-16 NOTE — Telephone Encounter (Signed)
Patient called and cancelled fasting lab appointment.

## 2018-11-17 ENCOUNTER — Other Ambulatory Visit: Payer: 59

## 2018-11-21 NOTE — Telephone Encounter (Signed)
Spoke with patient. Fasting lipid panel labs rescheduled to 2/21 at 8:30am.   Routing to provider for final review. Patient is agreeable to disposition. Will close encounter.

## 2018-11-22 ENCOUNTER — Ambulatory Visit (HOSPITAL_COMMUNITY): Payer: Self-pay | Admitting: Psychiatry

## 2018-12-01 ENCOUNTER — Other Ambulatory Visit: Payer: Self-pay

## 2018-12-07 ENCOUNTER — Telehealth: Payer: Self-pay | Admitting: Certified Nurse Midwife

## 2018-12-07 NOTE — Telephone Encounter (Signed)
Patient canceled her lab appointment tomorrow and will call later to reschedule.

## 2018-12-08 ENCOUNTER — Other Ambulatory Visit: Payer: Self-pay

## 2018-12-18 NOTE — Telephone Encounter (Signed)
I was unable to reach patient by phone, mailbox still full. Okay to close encounter?

## 2018-12-19 NOTE — Telephone Encounter (Signed)
Joy will check on her cancelling lab?

## 2018-12-22 NOTE — Telephone Encounter (Signed)
Appointment scheduled for next week. Please place order.

## 2018-12-26 ENCOUNTER — Other Ambulatory Visit: Payer: Self-pay | Admitting: Certified Nurse Midwife

## 2018-12-26 ENCOUNTER — Telehealth (HOSPITAL_COMMUNITY): Payer: Self-pay

## 2018-12-26 DIAGNOSIS — R899 Unspecified abnormal finding in specimens from other organs, systems and tissues: Secondary | ICD-10-CM

## 2018-12-26 DIAGNOSIS — F411 Generalized anxiety disorder: Secondary | ICD-10-CM

## 2018-12-26 NOTE — Telephone Encounter (Signed)
Order placed

## 2018-12-26 NOTE — Telephone Encounter (Signed)
Medication refill - Patient left a message requesting refills of Propranolol and Zoloft as she had to reschedule her appt from 12/27/18 due to being out of town. Patient rescheduled for 01/22/19 but needs new orders of both prescriptions.

## 2018-12-27 ENCOUNTER — Ambulatory Visit (HOSPITAL_COMMUNITY): Payer: Self-pay | Admitting: Psychiatry

## 2018-12-27 MED ORDER — PROPRANOLOL HCL 10 MG PO TABS: 10.0000 mg | ORAL_TABLET | ORAL | 0 refills | Status: DC | PRN

## 2018-12-27 NOTE — Telephone Encounter (Signed)
She was given Zoloft by [name] in December for 6 months.  She should have a refill remaining.  I will provide 30-day supply of propanolol until she return to office for appointment.

## 2018-12-27 NOTE — Telephone Encounter (Signed)
Medication management - Telephone call with patient to inform Dr. Adele Schilder had sent in a one time refill of her propranolol and that she should have a refill of Zoloft [name] had sent in in December for 90 days + 1 refill. Patient to call back if issues filling and informed they were both sent to her [facility] on [address].

## 2018-12-29 ENCOUNTER — Other Ambulatory Visit: Payer: 59

## 2019-01-22 ENCOUNTER — Other Ambulatory Visit: Payer: Self-pay

## 2019-01-22 ENCOUNTER — Ambulatory Visit (INDEPENDENT_AMBULATORY_CARE_PROVIDER_SITE_OTHER): Payer: 59 | Admitting: Psychiatry

## 2019-01-22 DIAGNOSIS — F410 Panic disorder [episodic paroxysmal anxiety] without agoraphobia: Secondary | ICD-10-CM

## 2019-01-22 DIAGNOSIS — F411 Generalized anxiety disorder: Secondary | ICD-10-CM | POA: Diagnosis not present

## 2019-01-22 MED ORDER — PROPRANOLOL HCL 10 MG PO TABS: 10.0000 mg | ORAL_TABLET | ORAL | 0 refills | Status: DC | PRN

## 2019-01-22 MED ORDER — SERTRALINE HCL 50 MG PO TABS: 50.0000 mg | ORAL_TABLET | Freq: Every day | ORAL | 0 refills | Status: DC

## 2019-01-22 NOTE — Progress Notes (Signed)
Virtual Visit via Telephone Note  I connected with Cynthia Robertson on 01/22/19 at  1:40 PM EDT by telephone and verified that I am speaking with the correct person using two identifiers.   I discussed the limitations, risks, security and privacy concerns of performing an evaluation and management service by telephone and the availability of in person appointments. I also discussed with the patient that there may be a patient responsible charge related to this service. The patient expressed understanding and agreed to proceed.   History of Present Illness: Patient was evaluated through phone conversation.  She was last seen by this writer in December 2018 and then [name] in July 2019.  She is taking Zoloft and now started taking Inderal almost every day to help with anxiety.  Though she denies any major panic attack but admitted her anxiety is increased due to recent pandemic coronavirus.  She was working from home but now she will furlough starting next week.  She denies any suicidal thoughts or any homicidal thought.  She admitted feeling anxious but also feel the Zoloft working and helping.  She reported no side effects including tremors or shakes.  She is sleeping good.  She denies any crying spells or any feeling of hopelessness or worthlessness.  She lives with her husband who is very supportive.  She visited in the emergency room few months ago because of chest pain which turned out to be acid reflux.  Patient like to continue her current medication.  She reported her energy level is okay.    Past Psychiatric History: Reviewed. Patient has one psychiatric hospitalization to behavioral [place] due to significant anxiety and passive suicidal thoughts.  Patient denies any history of paranoia, hallucination, mania, violence, suicidal attempt.  In the past she had tried Celexa, Lexapro and Xanax from her primary care physician.  She stopped those medication because it did not work.  She had a  good response with Zoloft and Inderal.   Observations/Objective: Limited mental status examination done on the phone.  Patient described mood anxious but denies any auditory or visual hallucination.  She denies any active or passive suicidal thoughts or homicidal thought.  Her speech is clear, coherent and relevant.  Her thought process logical and goal-directed.  There were no delusions or any paranoia.  Her attention and concentration is fair.  She is alert and oriented x3.  Her cognition is intact.  Her fund of knowledge is adequate.  Her insight and judgment is okay.  Assessment and Plan: Panic attacks.  Generalized anxiety disorder.  Discussed situational anxiety due to pandemic coronavirus.  Patient is taking Adderall almost every day which is helping her anxiety and panic attacks.  She reported no side effects from the medication.  I will continue Zoloft 50 mg daily and Inderal 10 mg as needed for anxiety and panic attack.  Discussed medication side effects and benefits.  Recommended to call us back if she has any question or any concern.  Follow-up in 3 months.  Follow Up Instructions:    I discussed the assessment and treatment plan with the patient. The patient was provided an opportunity to ask questions and all were answered. The patient agreed with the plan and demonstrated an understanding of the instructions.   The patient was advised to call back or seek an in-person evaluation if the symptoms worsen or if the condition fails to improve as anticipated.  I provided 20 minutes of non-face-to-face time during this encounter.  Cynthia Nations, MD

## 2019-03-01 ENCOUNTER — Ambulatory Visit: Payer: Self-pay | Admitting: Family

## 2019-03-01 NOTE — Telephone Encounter (Signed)
Pt. Started having a sore throat on Mother's Day. Continues to have sore throat, ear pain, swollen glands. Husband tested positive for strep yesterday. Warm transfer to [facility] in the practice for visit. Answer Assessment - Initial Assessment Questions 1. ONSET: "When did the throat start hurting?" (Hours or days ago)      Mother's day 2. SEVERITY: "How bad is the sore throat?" (Scale 1-10; mild, moderate or severe)   - MILD (1-3):  doesn't interfere with eating or normal activities   - MODERATE (4-7): interferes with eating some solids and normal activities   - SEVERE (8-10):  excruciating pain, interferes with most normal activities   - SEVERE DYSPHAGIA: can't swallow liquids, drooling     Mild 3. STREP EXPOSURE: "Has there been any exposure to strep within the past week?" If so, ask: "What type of contact occurred?"      Husband tested positive 4.  VIRAL SYMPTOMS: "Are there any symptoms of a cold, such as a runny nose, cough, hoarse voice or red eyes?"      Era pain. Glands are swollen 5. FEVER: "Do you have a fever?" If so, ask: "What is your temperature, how was it measured, and when did it start?"     Had a fever Sunday and Monday 6. PUS ON THE TONSILS: "Is there pus on the tonsils in the back of your throat?"     Unsure 7. OTHER SYMPTOMS: "Do you have any other symptoms?" (e.g., difficulty breathing, headache, rash)     Headache 8. PREGNANCY: "Is there any chance you are pregnant?" "When was your last menstrual period?"     No  Protocols used: SORE THROAT-A-AH

## 2019-03-01 NOTE — Telephone Encounter (Signed)
Virtual visit scheduled.  

## 2019-03-02 ENCOUNTER — Telehealth (INDEPENDENT_AMBULATORY_CARE_PROVIDER_SITE_OTHER): Payer: 59 | Admitting: Family

## 2019-03-02 DIAGNOSIS — H6690 Otitis media, unspecified, unspecified ear: Secondary | ICD-10-CM | POA: Diagnosis not present

## 2019-03-02 DIAGNOSIS — J020 Streptococcal pharyngitis: Secondary | ICD-10-CM | POA: Diagnosis not present

## 2019-03-02 MED ORDER — AMOXICILLIN 500 MG PO CAPS: 500.0000 mg | ORAL_CAPSULE | Freq: Three times a day (TID) | ORAL | 0 refills | Status: DC

## 2019-03-02 NOTE — Progress Notes (Signed)
Virtual Visit via Video Note  I connected with Alexis Frock on 03/02/19 at  7:00 AM EDT by a video enabled telemedicine application and verified that I am speaking with the correct person using two identifiers. This visit type was conducted due to national recommendations for restrictions regarding the COVID-19 Pandemic (e.g. social distancing).  This format is felt to be most appropriate for this patient at this time.   I discussed the limitations of evaluation and management by telemedicine and the availability of in person appointments. The patient expressed understanding and agreed to proceed.  Only the patient and myself were on today's video visit. The patient was at home and I was in my office at the time of today's visit.   History of Present Illness:  Patient is a 28 yr old female who presents today with report of scratchy throat, and stuffy nose which initially began on 02/17/19.   These symptoms lasted a few days and then resolved.  This past weekend she again developed a sore throat, ear pain and swollen glands.  Reports that she haa tmax of 100.5 on Sunday and then temp of 99.3 on Monday.  Today her throat is better but she continues to have off/on ear pain.  Reports mild associated HA behind the left eye and in her temples. Using ibuprofen for HA with relief. Reports that husband recently tested positive for strep.     Observations/Objective:   Gen: Awake, alert, no acute distress Resp: Breathing is even and non-labored Psych: calm/pleasant demeanor Neuro: Alert and Oriented x 3, + facial symmetry, speech is clear.  Assessment and Plan:  Strep throat/otitis media- symptoms and hx most consistent with strep throat and otitis media. Will rx with amoxicillin. Pt is advised to call if new/worsening symptoms or if symptoms fail to improve.    Follow Up Instructions:    I discussed the assessment and treatment plan with the patient. The patient was provided an opportunity to ask  questions and all were answered. The patient agreed with the plan and demonstrated an understanding of the instructions.   The patient was advised to call back or seek an in-person evaluation if the symptoms worsen or if the condition fails to improve as anticipated.    Nance Pear, NP

## 2019-03-22 ENCOUNTER — Ambulatory Visit: Payer: 59 | Admitting: Family Medicine

## 2019-03-22 ENCOUNTER — Encounter: Payer: Self-pay | Admitting: Family Medicine

## 2019-03-22 ENCOUNTER — Other Ambulatory Visit: Payer: Self-pay

## 2019-03-22 DIAGNOSIS — W19XXXA Unspecified fall, initial encounter: Secondary | ICD-10-CM | POA: Insufficient documentation

## 2019-03-22 NOTE — Progress Notes (Signed)
Virtual Visit via Video Note  I connected with Cynthia Robertson on 03/22/19 at  2:00 PM EDT by a video enabled telemedicine application and verified that I am speaking with the correct person using two identifiers.  Location:--- pt never answered phone, facetime or text message--- attempted several times   Patient:  Provider:   I discussed the limitations of evaluation and management by telemedicine and the availability of in person appointments. The patient expressed understanding and agreed to proceed.  History of Present Illness:    Observations/Objective:   Assessment and Plan:   Follow Up Instructions:    I discussed the assessment and treatment plan with the patient. The patient was provided an opportunity to ask questions and all were answered. The patient agreed with the plan and demonstrated an understanding of the instructions.   The patient was advised to call back or seek an in-person evaluation if the symptoms worsen or if the condition fails to improve as anticipated.  I provided 0 minutes of non-face-to-face time during this encounter.   Ann Held, DO

## 2019-03-31 ENCOUNTER — Emergency Department (HOSPITAL_COMMUNITY)
Admission: EM | Admit: 2019-03-31 | Discharge: 2019-03-31 | Disposition: A | Discharge status: Home / Self Care | Payer: 59 | Attending: Emergency Medicine | Admitting: Emergency Medicine

## 2019-03-31 ENCOUNTER — Encounter (HOSPITAL_COMMUNITY): Payer: Self-pay | Admitting: Emergency Medicine

## 2019-03-31 ENCOUNTER — Emergency Department (HOSPITAL_COMMUNITY): Payer: 59

## 2019-03-31 ENCOUNTER — Other Ambulatory Visit: Payer: Self-pay

## 2019-03-31 DIAGNOSIS — M79602 Pain in left arm: Secondary | ICD-10-CM

## 2019-03-31 DIAGNOSIS — Z79899 Other long term (current) drug therapy: Secondary | ICD-10-CM | POA: Insufficient documentation

## 2019-03-31 DIAGNOSIS — W19XXXA Unspecified fall, initial encounter: Secondary | ICD-10-CM

## 2019-03-31 MED ORDER — PREDNISONE 20 MG PO TABS: 40.0000 mg | ORAL_TABLET | Freq: Every day | ORAL | 0 refills | Status: AC

## 2019-03-31 MED ORDER — LIDOCAINE 5 % EX PTCH: 1.0000 | MEDICATED_PATCH | CUTANEOUS | 0 refills | Status: AC
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
## 2019-03-31 NOTE — ED Notes (Signed)
Patient verbalizes understanding of discharge instructions. Opportunity for questioning and answers were provided. Armband removed by staff, pt discharged from ED. Ambulated out to lobby  

## 2019-03-31 NOTE — Discharge Instructions (Addendum)
Your x-rays today were normal.  I have prescribed a short course of steroids to help with your left shoulder pain, please take these as prescribed.  The number to Dr. Mardelle Matte, orthopedist attached to your paperwork, please schedule an appointment for further evaluation of your left shoulder pain.

## 2019-03-31 NOTE — ED Notes (Signed)
Patient transported to X-ray 

## 2019-03-31 NOTE — ED Triage Notes (Signed)
Pt in w/L neck pain radiating down to L arm. States she fell on [place] and pain has persisted since. C/o some numbness in L fingertips, full ROM in extremity. Denies any head/neck injury at time of fall

## 2019-05-02 ENCOUNTER — Encounter (HOSPITAL_COMMUNITY): Payer: Self-pay | Admitting: Psychiatry

## 2019-05-02 ENCOUNTER — Ambulatory Visit (INDEPENDENT_AMBULATORY_CARE_PROVIDER_SITE_OTHER): Payer: 59 | Admitting: Psychiatry

## 2019-05-02 ENCOUNTER — Other Ambulatory Visit: Payer: Self-pay

## 2019-05-02 DIAGNOSIS — F410 Panic disorder [episodic paroxysmal anxiety] without agoraphobia: Secondary | ICD-10-CM

## 2019-05-02 DIAGNOSIS — F411 Generalized anxiety disorder: Secondary | ICD-10-CM | POA: Diagnosis not present

## 2019-05-02 MED ORDER — PROPRANOLOL HCL 10 MG PO TABS: 10.0000 mg | ORAL_TABLET | ORAL | 0 refills | Status: DC | PRN

## 2019-05-02 MED ORDER — SERTRALINE HCL 50 MG PO TABS: 50.0000 mg | ORAL_TABLET | Freq: Every day | ORAL | 0 refills | Status: DC

## 2019-05-02 NOTE — Progress Notes (Signed)
Virtual Visit via Telephone Note  I connected with Cynthia Robertson on 05/02/19 at  3:00 PM EDT by telephone and verified that I am speaking with the correct person using two identifiers.   I discussed the limitations, risks, security and privacy concerns of performing an evaluation and management service by telephone and the availability of in person appointments. I also discussed with the patient that there may be a patient responsible charge related to this service. The patient expressed understanding and agreed to proceed.   History of Present Illness: Patient was evaluated through phone session.  She is taking Zoloft every day which is helping her anxiety.  She takes propanolol every other day as her anxiety is not as bad.  However recently she is some anxiety because of upcoming neck surgery.  She is going to have her neck surgery for her disc which is causing a lot of pain.  She was given tramadol, Robaxin and a steroid but none of these medicine help and she is no longer taking it.  Patient is still furlough but hoping to resume work in second week of August.  She lives with her husband who is very supportive.  She is pleased that her husband is working.  Patient denies any crying spells or any feeling of hopelessness or worthlessness.  Her sleep is good.  Her appetite is okay.  Her energy level is good.  She reported her weight is stable.  She is tolerating her medication and reported no tremors shakes or any EPS.  She like to continue her medicine.  Past Psychiatric History:Reviewed. Patient has one psychiatric hospitalization to behavioral [place] due to significant anxiety and passive suicidal thoughts. Patient denies any history of paranoia, hallucination, mania, violence, suicidal attempt. In the past she had tried Celexa, Lexapro and Xanax from her primary care physician. She stopped those medication because it did not work. She had a good response with Zoloft and Inderal.     Psychiatric Specialty Exam: Physical Exam  ROS  There were no vitals taken for this visit.There is no height or weight on file to calculate BMI.  General Appearance: NA  Eye Contact:  NA  Speech:  Clear and Coherent and Normal Rate  Volume:  Normal  Mood:  Anxious  Affect:  NA  Thought Process:  Goal Directed  Orientation:  Full (Time, Place, and Person)  Thought Content:  WDL and Logical  Suicidal Thoughts:  No  Homicidal Thoughts:  No  Memory:  Immediate;   Good Recent;   Good Remote;   Good  Judgement:  Good  Insight:  Good  Psychomotor Activity:  NA  Concentration:  Concentration: Good and Attention Span: Good  Recall:  Good  Fund of Knowledge:  Good  Language:  Good  Akathisia:  No  Handed:  Right  AIMS (if indicated):     Assets:  Communication Skills Desire for Improvement Housing Resilience Social Support Talents/Skills  ADL's:  Intact  Cognition:  WNL  Sleep:   ok      Assessment and Plan: Generalized anxiety disorder.  Panic attacks.  Discuss her upcoming neck surgery in first week of August.  She is taking Inderal every other day to avoid any panic attacks which is working well.  She is also taking Zoloft 50 mg is helping her generalized anxiety disorder.  Recommend to continue that.  Discussed medication side effects and benefits.  Recommended to call us back if she has any question or any concern.  Follow-up in 3 months.  Follow Up Instructions:    I discussed the assessment and treatment plan with the patient. The patient was provided an opportunity to ask questions and all were answered. The patient agreed with the plan and demonstrated an understanding of the instructions.   The patient was advised to call back or seek an in-person evaluation if the symptoms worsen or if the condition fails to improve as anticipated.  I provided 20 minutes of non-face-to-face time during this encounter.   [name], MD

## 2019-07-31 ENCOUNTER — Ambulatory Visit (HOSPITAL_COMMUNITY): Payer: 59 | Admitting: Psychiatry

## 2019-08-01 ENCOUNTER — Ambulatory Visit (HOSPITAL_COMMUNITY): Payer: 59 | Admitting: Psychiatry

## 2019-08-06 ENCOUNTER — Encounter (HOSPITAL_COMMUNITY): Payer: Self-pay | Admitting: Psychiatry

## 2019-08-06 ENCOUNTER — Ambulatory Visit (INDEPENDENT_AMBULATORY_CARE_PROVIDER_SITE_OTHER): Payer: 59 | Admitting: Psychiatry

## 2019-08-06 ENCOUNTER — Other Ambulatory Visit: Payer: Self-pay

## 2019-08-06 DIAGNOSIS — F410 Panic disorder [episodic paroxysmal anxiety] without agoraphobia: Secondary | ICD-10-CM

## 2019-08-06 DIAGNOSIS — F411 Generalized anxiety disorder: Secondary | ICD-10-CM

## 2019-08-06 MED ORDER — PROPRANOLOL HCL 10 MG PO TABS: 10.0000 mg | ORAL_TABLET | ORAL | 0 refills | Status: DC | PRN

## 2019-08-06 MED ORDER — SERTRALINE HCL 50 MG PO TABS: 50.0000 mg | ORAL_TABLET | Freq: Every day | ORAL | 0 refills | Status: DC

## 2019-08-06 NOTE — Progress Notes (Signed)
Virtual Visit via Telephone Note  I connected with Cynthia Robertson on 08/06/19 at  2:40 PM EDT by telephone and verified that I am speaking with the correct person using two identifiers.   I discussed the limitations, risks, security and privacy concerns of performing an evaluation and management service by telephone and the availability of in person appointments. I also discussed with the patient that there may be a patient responsible charge related to this service. The patient expressed understanding and agreed to proceed.   History of Present Illness: Patient was evaluated by phone session.  She is doing very well on Zoloft and Inderal.  She recently had neck surgery in August and now feeling much better.  She is working from home and she is pleased that things are going well.  She lost 30 pounds since the last visit.  She is watching her diet and doing exercise.  Recently her husband has Covid but he is recovering from it.  Patient was given narcotic pain medication at the time of her surgery but now she is no longer taking any pain medication.  She is sleeping good.  She denies any major panic attack in recent months.  She reported her anxiety and depression under control.  She has no tremors, shakes or any EPS.  She denies any paranoia or any hallucination.  She wants to continue Zoloft and Inderal.  Past Psychiatric History:Reviewed. H/O one inpatient at [facility] for anxiety and passive suicidal thoughts. Tried Celexa, Lexapro, and Xanax from PCP. H/O panic attacks. No h/o paranoia, hallucination, mania, violence, suicidal attempt.    Psychiatric Specialty Exam: Physical Exam  ROS  There were no vitals taken for this visit.There is no height or weight on file to calculate BMI.  General Appearance: NA  Eye Contact:  NA  Speech:  Clear and Coherent and Normal Rate  Volume:  Normal  Mood:  Euthymic  Affect:  NA  Thought Process:  Coherent and Goal Directed  Orientation:  Full (Time, Place,  and Person)  Thought Content:  WDL and Logical  Suicidal Thoughts:  No  Homicidal Thoughts:  No  Memory:  Immediate;   Good Recent;   Good Remote;   Good  Judgement:  Good  Insight:  Good  Psychomotor Activity:  NA  Concentration:  Concentration: Good and Attention Span: Good  Recall:  Good  Fund of Knowledge:  Good  Language:  Good  Akathisia:  No  Handed:  Right  AIMS (if indicated):     Assets:  Communication Skills Desire for Improvement Housing Physical Health Resilience Social Support Talents/Skills  ADL's:  Intact  Cognition:  WNL  Sleep:   ok      Assessment and Plan: Generalized anxiety disorder.  Panic attack.  Patient doing very well on her current medication.  She lost 30 pounds since the last visit.  She is no longer taking any pain medication.  Her surgery went well and she recovered very well from the surgery.  Discussed medication side effects and benefits.  Continue Zoloft 50 mg daily and Inderal 10 mg as needed for anxiety.  Recommended to call us back if she has any question or any concern.  Follow-up in 3 months.  Follow Up Instructions:    I discussed the assessment and treatment plan with the patient. The patient was provided an opportunity to ask questions and all were answered. The patient agreed with the plan and demonstrated an understanding of the instructions.   The patient  was advised to call back or seek an in-person evaluation if the symptoms worsen or if the condition fails to improve as anticipated.  I provided 20 minutes of non-face-to-face time during this encounter.   [name], MD

## 2019-08-15 ENCOUNTER — Ambulatory Visit: Payer: 59 | Admitting: Certified Nurse Midwife

## 2019-09-04 ENCOUNTER — Ambulatory Visit: Payer: 59 | Admitting: Certified Nurse Midwife

## 2019-09-13 ENCOUNTER — Telehealth: Payer: Self-pay

## 2019-09-13 NOTE — Telephone Encounter (Signed)
30 day Letter signed and approved by Dr Talbert Nan, Will be mailed today and sent to pt via mychart. Will close encounter.

## 2019-09-13 NOTE — Telephone Encounter (Signed)
Discharge letter placed on Dr Gentry Fitz desk and waiting signature and approval.

## 2019-11-06 ENCOUNTER — Encounter (HOSPITAL_COMMUNITY): Payer: Self-pay | Admitting: Psychiatry

## 2019-11-06 ENCOUNTER — Other Ambulatory Visit: Payer: Self-pay

## 2019-11-06 ENCOUNTER — Ambulatory Visit (INDEPENDENT_AMBULATORY_CARE_PROVIDER_SITE_OTHER): Payer: Managed Care, Other (non HMO) | Admitting: Psychiatry

## 2019-11-06 DIAGNOSIS — F410 Panic disorder [episodic paroxysmal anxiety] without agoraphobia: Secondary | ICD-10-CM

## 2019-11-06 DIAGNOSIS — F411 Generalized anxiety disorder: Secondary | ICD-10-CM | POA: Diagnosis not present

## 2019-11-06 MED ORDER — PROPRANOLOL HCL 10 MG PO TABS: 10.0000 mg | ORAL_TABLET | ORAL | 0 refills | Status: DC | PRN

## 2019-11-06 MED ORDER — SERTRALINE HCL 50 MG PO TABS: 50.0000 mg | ORAL_TABLET | Freq: Every day | ORAL | 0 refills | Status: DC

## 2019-11-06 NOTE — Progress Notes (Signed)
Virtual Visit via Telephone Note  I connected with Cynthia Robertson on 11/06/19 at  4:00 PM EST by telephone and verified that I am speaking with the correct person using two identifiers.   I discussed the limitations, risks, security and privacy concerns of performing an evaluation and management service by telephone and the availability of in person appointments. I also discussed with the patient that there may be a patient responsible charge related to this service. The patient expressed understanding and agreed to proceed.   History of Present Illness: Patient was evaluated by phone session. She is doing well on Zoloft and propranolol. Recently she was found Covid positive and working from home. She denies any major panic attack but now take Inderal every day which helps her anxiety. She lost another 15 pounds since the last visit. She is doing regular exercise and watching her calorie intake. She has good Christmas. She has a good relationship with her husband. Overall her anxiety and nervousness is under control. She is sleeping better. She has no tremors, shakes or any EPS. She denies any suicidal thoughts, hallucination, crying spells or any feeling of hopelessness. She like to continue Zoloft and Inderal at present dose.   Past Psychiatric History:Reviewed. H/O one inpatient at [facility] for anxiety and passive suicidal thoughts. Tried Celexa, Lexapro, and Xanax from PCP. H/O panic attacks. No h/o paranoia, hallucination, mania, violence, suicidal attempt.     Psychiatric Specialty Exam: Physical Exam  Review of Systems  There were no vitals taken for this visit.There is no height or weight on file to calculate BMI.  General Appearance: NA  Eye Contact:  NA  Speech:  Clear and Coherent and Normal Rate  Volume:  Normal  Mood:  Euthymic  Affect:  NA  Thought Process:  Goal Directed  Orientation:  Full (Time, Place, and Person)  Thought Content:  WDL and Logical  Suicidal Thoughts:  No   Homicidal Thoughts:  No  Memory:  Immediate;   Good Recent;   Good Remote;   Good  Judgement:  Good  Insight:  Good  Psychomotor Activity:  NA  Concentration:  Concentration: Good and Attention Span: Good  Recall:  Good  Fund of Knowledge:  Good  Language:  Good  Akathisia:  No  Handed:  Right  AIMS (if indicated):     Assets:  Communication Skills Desire for Improvement Housing Resilience Social Support Talents/Skills Transportation  ADL's:  Intact  Cognition:  WNL  Sleep:   ok      Assessment and Plan: Generalized anxiety disorder. Panic attacks.  Patient is a stable on her current medication. She lost another 15 pounds since the last visit. She feels good about her general health. Continue Zoloft 50 mg daily and Inderal 10 mg daily to help her anxiety. Discussed medication side effects and benefits. Recommended to call us back if she is any question of any concern. Follow-up in 3 months.  Follow Up Instructions:    I discussed the assessment and treatment plan with the patient. The patient was provided an opportunity to ask questions and all were answered. The patient agreed with the plan and demonstrated an understanding of the instructions.   The patient was advised to call back or seek an in-person evaluation if the symptoms worsen or if the condition fails to improve as anticipated.  I provided 20 minutes of non-face-to-face time during this encounter.   [name], MD

## 2019-11-23 IMAGING — DX LEFT SHOULDER - 2+ VIEW
3 series · 3 of 3 positions shown · non-contrast
Comparison: None.

CLINICAL DATA: Fall a few weeks ago with persistent left shoulder
pain.

EXAM:
LEFT SHOULDER - 2+ VIEW

[shoulder grashey]
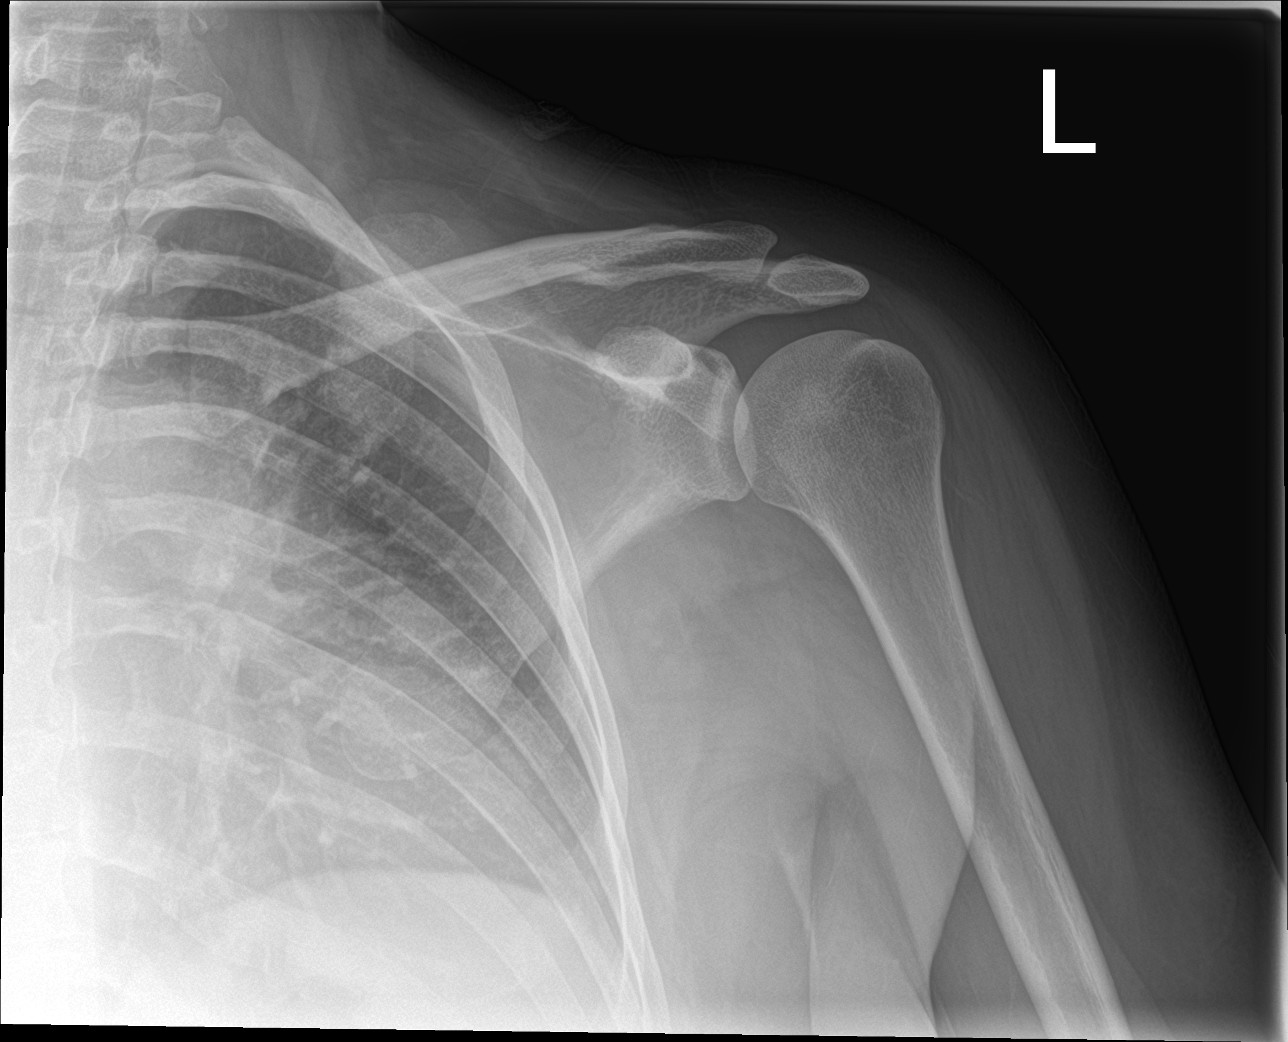

[shoulder y view]
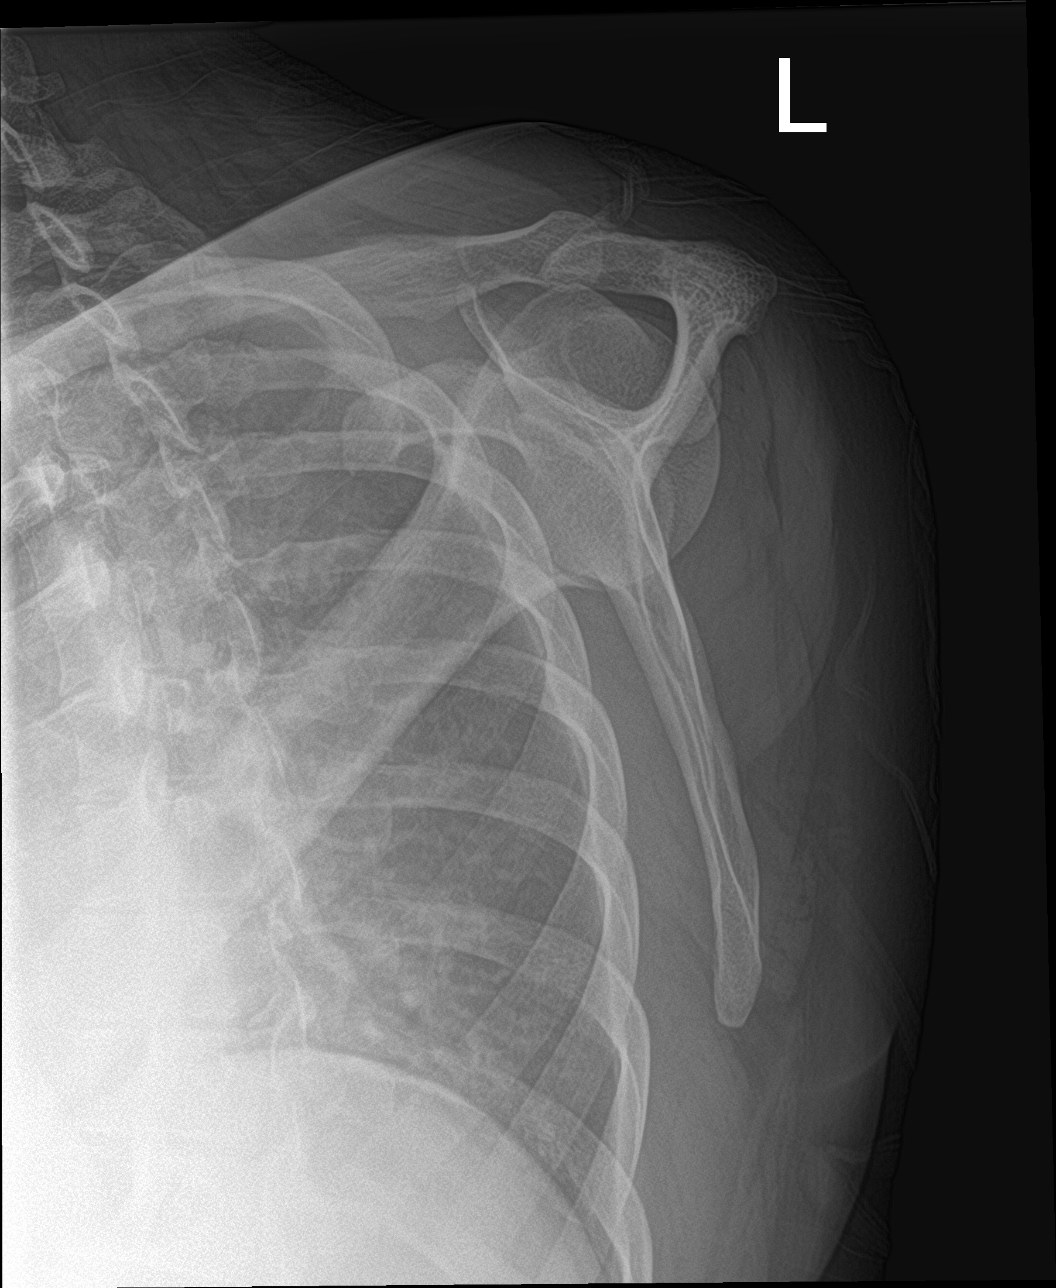

[shoulder axillary]
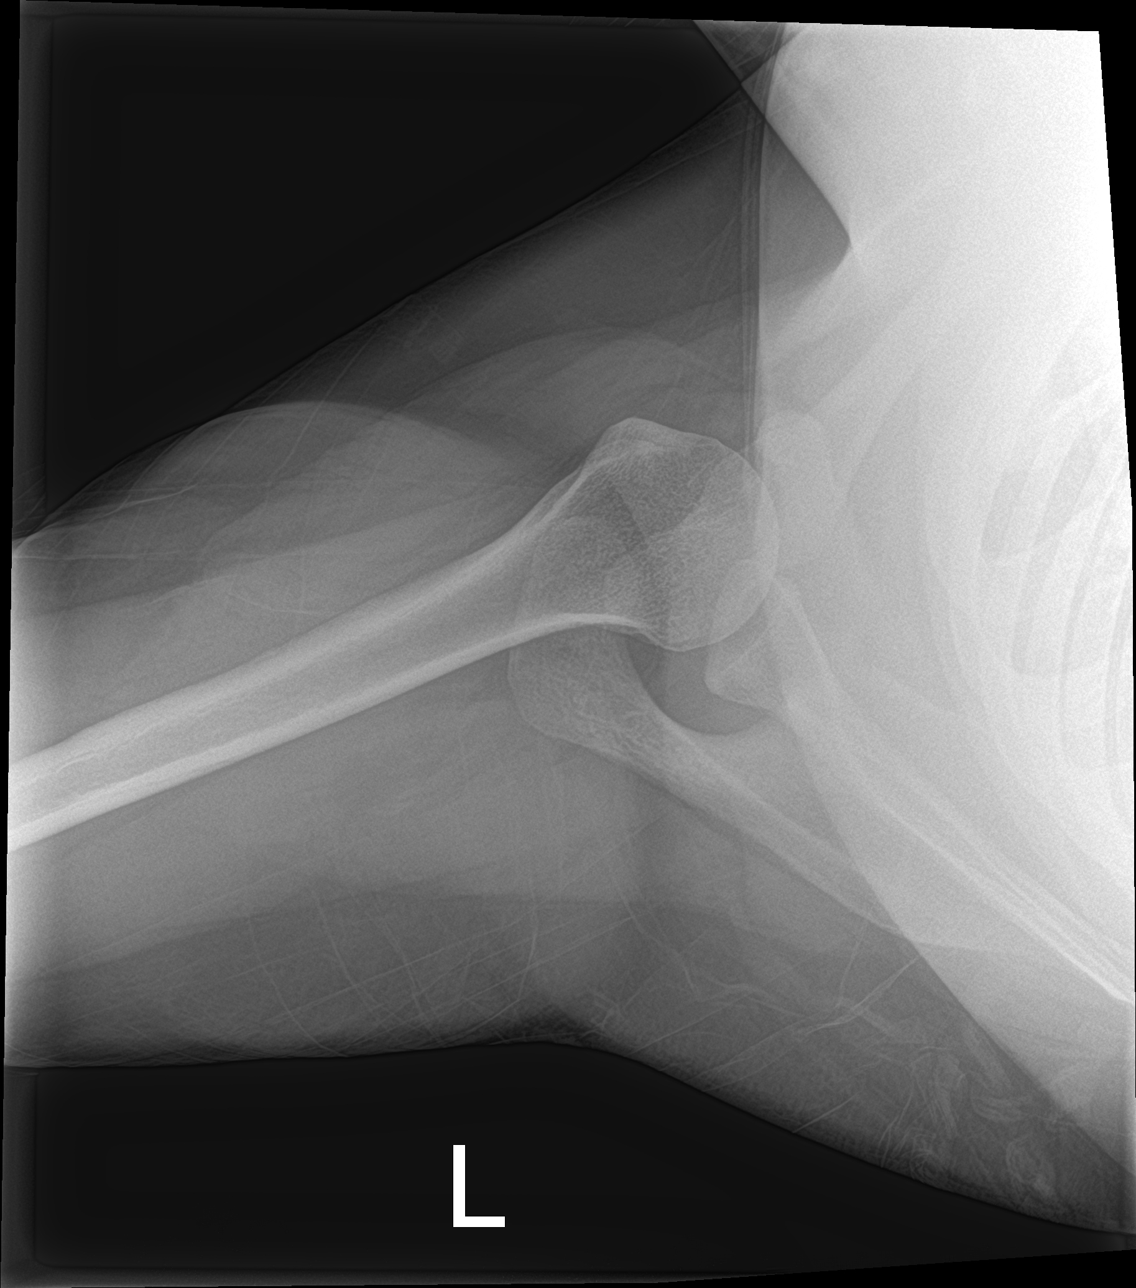

[3 of 3 positions shown; findings below may reference images not displayed]

FINDINGS: There is no evidence of fracture or dislocation. There is no
evidence of arthropathy or other focal bone abnormality. Soft
tissues are unremarkable.
IMPRESSION: Negative.

## 2019-11-23 IMAGING — DX CERVICAL SPINE - 2-3 VIEW
4 series · 4 of 4 positions shown · non-contrast
Comparison: None.

CLINICAL DATA: Fall a few weeks ago with persistent posterior neck
pain.

EXAM:
CERVICAL SPINE - 2-3 VIEW

[c-spine lat]
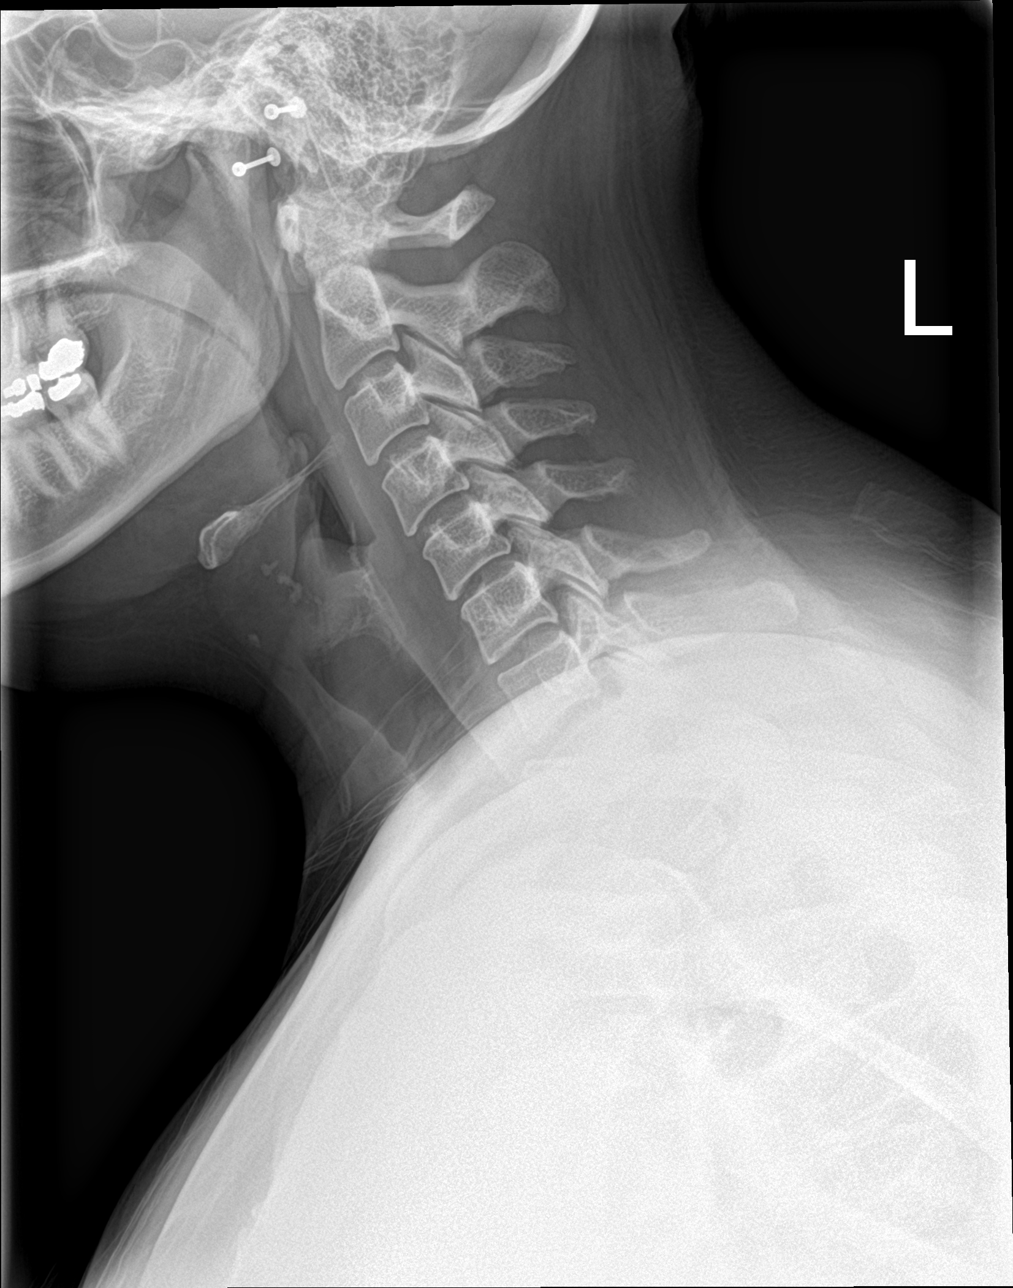

[c-spine ap]
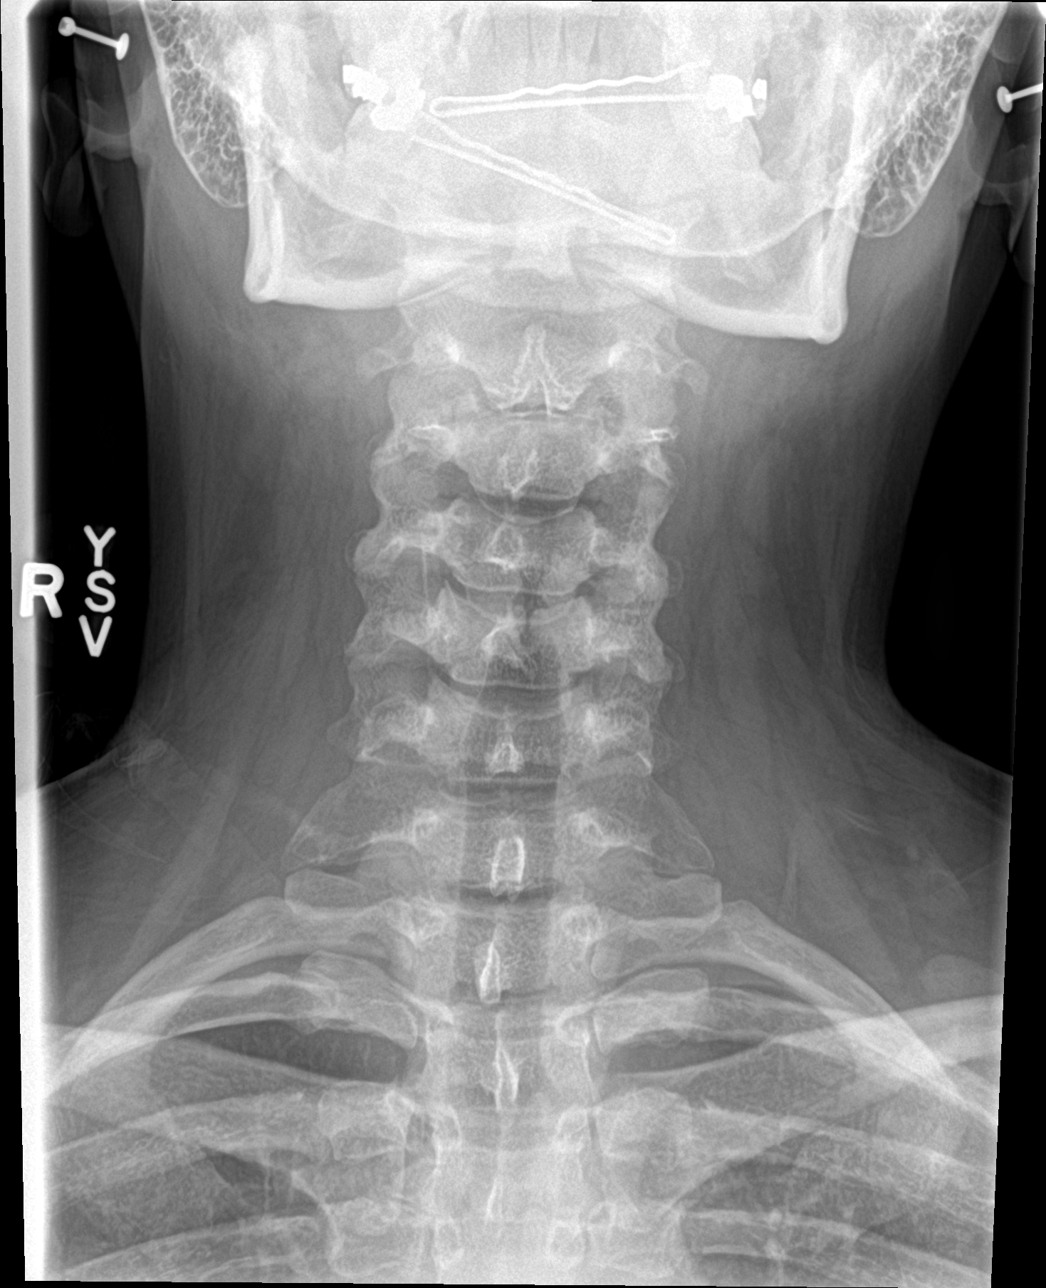

[c-spine swimmers (1 of 2)]
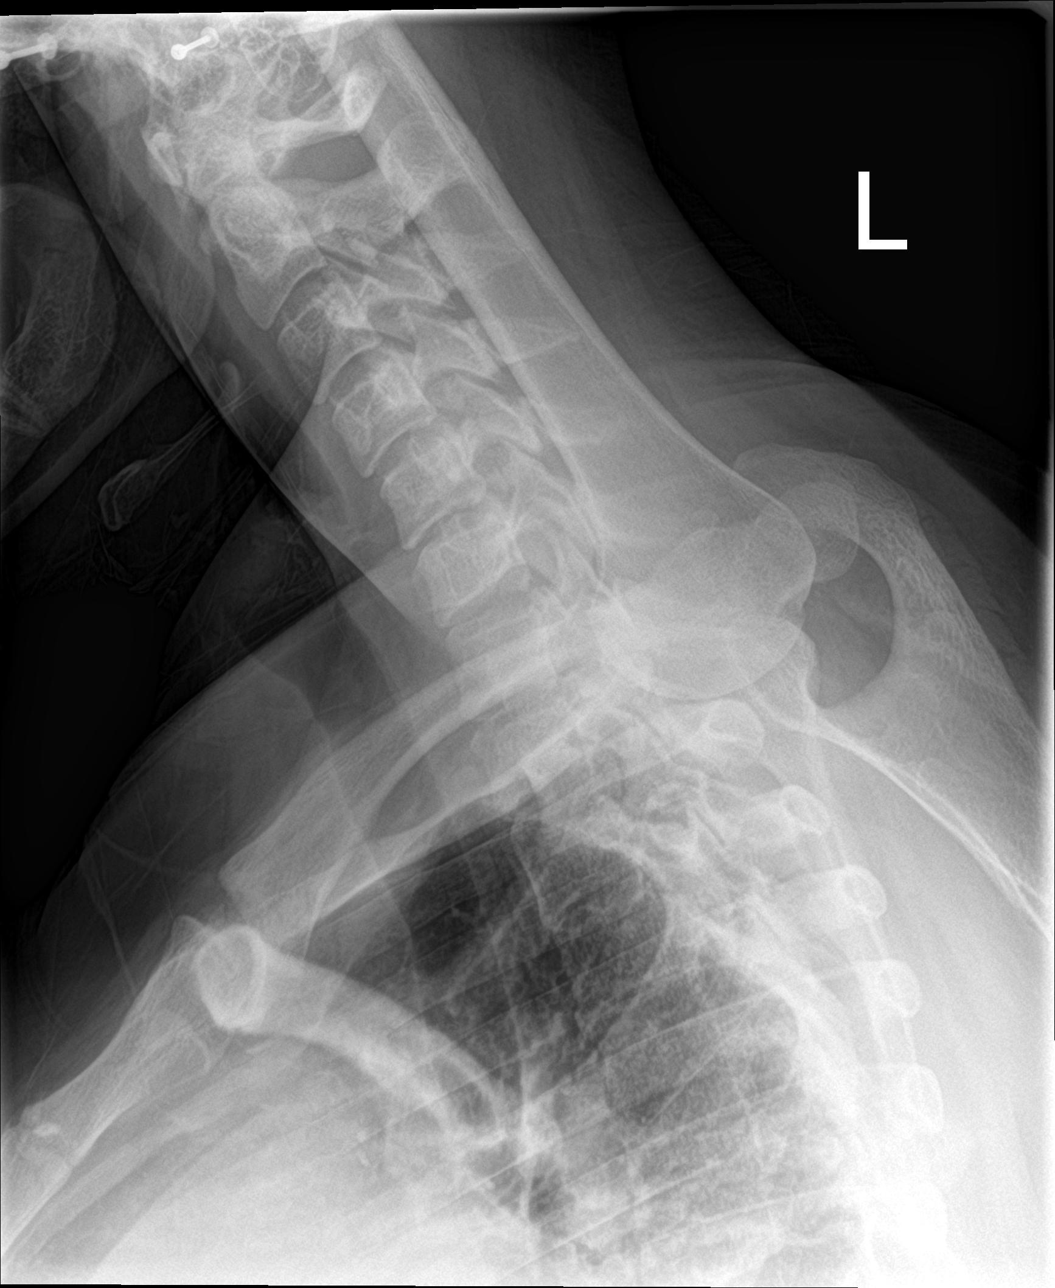

[c-spine swimmers (2 of 2)]
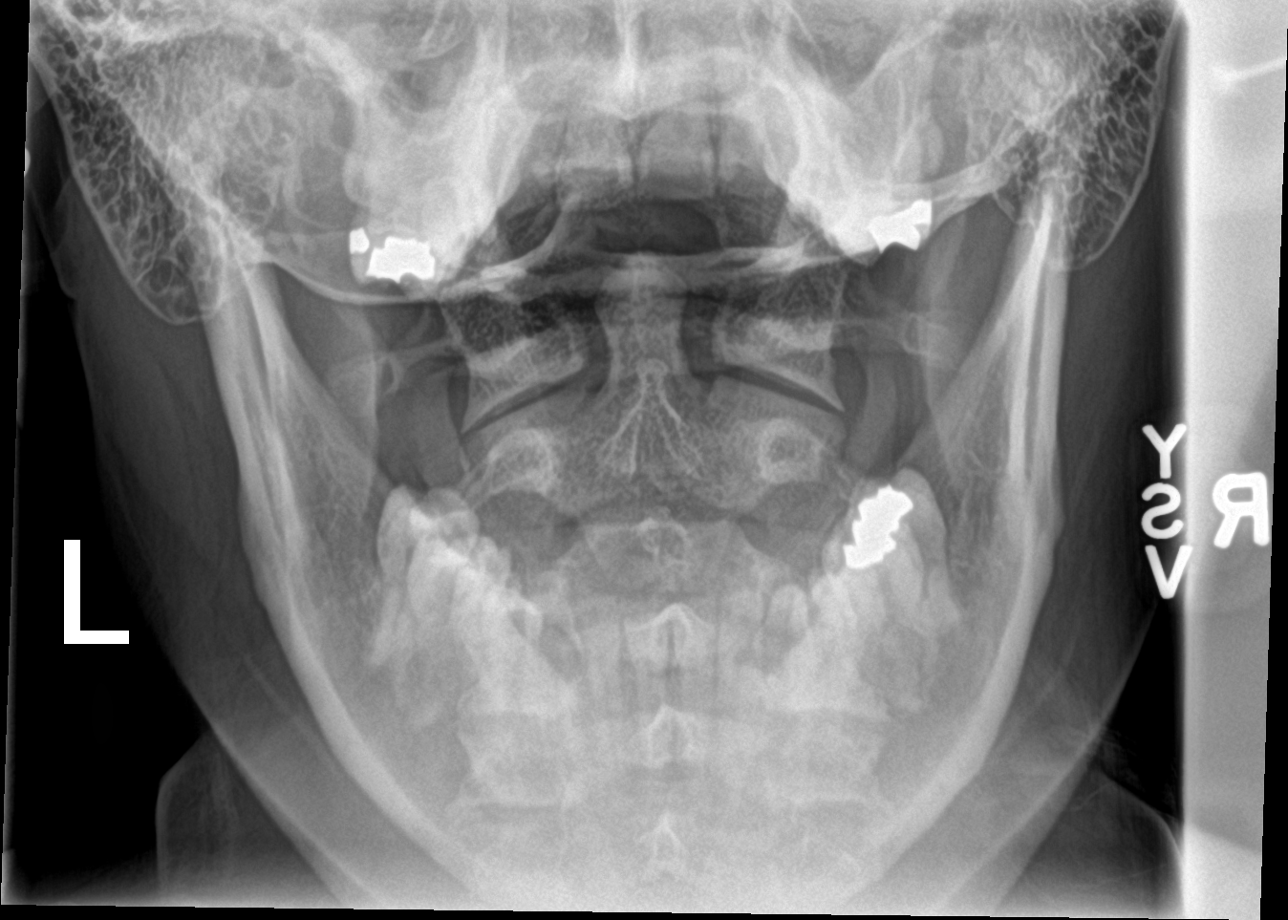

[4 of 4 positions shown; findings below may reference images not displayed]

FINDINGS: Vertebral body alignment, heights and disc space heights are normal.
Prevertebral soft tissues are normal. Atlantoaxial articulation is
normal. There is no fracture or subluxation.
IMPRESSION: No acute findings.

## 2019-12-24 ENCOUNTER — Encounter: Payer: Self-pay | Admitting: Certified Nurse Midwife

## 2019-12-26 ENCOUNTER — Encounter: Payer: Self-pay | Admitting: Certified Nurse Midwife

## 2020-02-04 ENCOUNTER — Encounter (HOSPITAL_COMMUNITY): Payer: Self-pay | Admitting: Psychiatry

## 2020-02-04 ENCOUNTER — Other Ambulatory Visit: Payer: Self-pay

## 2020-02-04 ENCOUNTER — Ambulatory Visit (HOSPITAL_COMMUNITY): Payer: Managed Care, Other (non HMO) | Admitting: Psychiatry

## 2020-02-04 ENCOUNTER — Telehealth (INDEPENDENT_AMBULATORY_CARE_PROVIDER_SITE_OTHER): Payer: 59 | Admitting: Psychiatry

## 2020-02-04 DIAGNOSIS — F410 Panic disorder [episodic paroxysmal anxiety] without agoraphobia: Secondary | ICD-10-CM | POA: Diagnosis not present

## 2020-02-04 DIAGNOSIS — F411 Generalized anxiety disorder: Secondary | ICD-10-CM | POA: Diagnosis not present

## 2020-02-04 MED ORDER — PROPRANOLOL HCL 10 MG PO TABS: 10.0000 mg | ORAL_TABLET | ORAL | 0 refills | Status: DC | PRN

## 2020-02-04 MED ORDER — SERTRALINE HCL 50 MG PO TABS: 50.0000 mg | ORAL_TABLET | Freq: Every day | ORAL | 0 refills | Status: DC

## 2020-02-04 NOTE — Progress Notes (Signed)
Virtual Visit via Telephone Note  I connected with Cynthia Robertson on 02/04/20 at  3:00 PM EDT by telephone and verified that I am speaking with the correct person using two identifiers.   I discussed the limitations, risks, security and privacy concerns of performing an evaluation and management service by telephone and the availability of in person appointments. I also discussed with the patient that there may be a patient responsible charge related to this service. The patient expressed understanding and agreed to proceed.   History of Present Illness: Patient is evaluated by phone session.  She is taking Zoloft and propranolol which is helping her anxiety and she denies any major panic attack in recent months.  She recently got promoted at work and she is happy about it.  She will start going to work in person 2 days a week to get training and then work from home.  She lost another 6 pounds in past 3 months.  Her energy level is good.  She is sleeping good.  She has a good relationship with her husband and now excited about upcoming vacation trip to beach.  She has no tremors shakes or any EPS.  She does not want to change the medication since it is working.  She denies any crying spells or any feeling of hopelessness.  Energy level is good.   Past Psychiatric History:Reviewed. H/O one inpatient at [facility] foranxiety and passive suicidal thoughts.Tried Celexa, Lexapro, and Xanax from PCP. H/O panic attacks. No h/oparanoia, hallucination, mania, violence, suicidal attempt.    Psychiatric Specialty Exam: Physical Exam  Review of Systems  Weight 188 lb (85.3 kg).There is no height or weight on file to calculate BMI.  General Appearance: NA  Eye Contact:  NA  Speech:  Clear and Coherent  Volume:  Normal  Mood:  Euthymic  Affect:  NA  Thought Process:  Goal Directed  Orientation:  Full (Time, Place, and Person)  Thought Content:  WDL  Suicidal Thoughts:  No  Homicidal Thoughts:  No   Memory:  Immediate;   Good Recent;   Good Remote;   Good  Judgement:  Good  Insight:  Good  Psychomotor Activity:  NA  Concentration:  Concentration: Good and Attention Span: Good  Recall:  Good  Fund of Knowledge:  Good  Language:  Good  Akathisia:  No  Handed:  Right  AIMS (if indicated):     Assets:  Communication Skills Desire for Improvement Housing Resilience Talents/Skills Transportation  ADL's:  Intact  Cognition:  WNL  Sleep:   good      Assessment and Plan: Generalized anxiety disorder.  Panic attacks.  Patient is a stable on her current medication.  She has no side effects.  Continue Zoloft 50 mg daily and Inderal 10 mg daily.  Encourage healthy lifestyle.  Recommended to call us back if she has any question of any concern.  Follow-up in 3 months.  Follow Up Instructions:    I discussed the assessment and treatment plan with the patient. The patient was provided an opportunity to ask questions and all were answered. The patient agreed with the plan and demonstrated an understanding of the instructions.   The patient was advised to call back or seek an in-person evaluation if the symptoms worsen or if the condition fails to improve as anticipated.  I provided 15 minutes of non-face-to-face time during this encounter.   [name], MD

## 2020-05-06 ENCOUNTER — Telehealth (HOSPITAL_COMMUNITY): Payer: 59 | Admitting: Psychiatry

## 2020-06-09 ENCOUNTER — Telehealth (INDEPENDENT_AMBULATORY_CARE_PROVIDER_SITE_OTHER): Payer: Self-pay | Admitting: Psychiatry

## 2020-06-09 ENCOUNTER — Telehealth (HOSPITAL_COMMUNITY): Payer: Self-pay | Admitting: Psychiatry

## 2020-06-09 ENCOUNTER — Other Ambulatory Visit: Payer: Self-pay

## 2020-06-09 ENCOUNTER — Encounter (HOSPITAL_COMMUNITY): Payer: Self-pay | Admitting: Psychiatry

## 2020-06-09 DIAGNOSIS — F410 Panic disorder [episodic paroxysmal anxiety] without agoraphobia: Secondary | ICD-10-CM

## 2020-06-09 DIAGNOSIS — F411 Generalized anxiety disorder: Secondary | ICD-10-CM

## 2020-06-09 MED ORDER — SERTRALINE HCL 50 MG PO TABS: 50.0000 mg | ORAL_TABLET | Freq: Every day | ORAL | 0 refills | Status: DC

## 2020-06-09 MED ORDER — PROPRANOLOL HCL 10 MG PO TABS: 10.0000 mg | ORAL_TABLET | ORAL | 0 refills | Status: DC | PRN

## 2020-06-09 NOTE — Progress Notes (Signed)
Virtual Visit via Telephone Note  I connected with Cynthia Robertson on 06/09/20 at  9:00 AM EDT by telephone and verified that I am speaking with the correct person using two identifiers.  Location: Patient: home Provider: home office   I discussed the limitations, risks, security and privacy concerns of performing an evaluation and management service by telephone and the availability of in person appointments. I also discussed with the patient that there may be a patient responsible charge related to this service. The patient expressed understanding and agreed to proceed.   History of Present Illness: Patient is evaluated by phone session.  She is compliant with Zoloft and Inderal.  She feels they are helping as she has no more panic attacks and her anxiety is under control.  In May she switch to a different job for a better pay and title.  She is now working full-time from home.  She is sleeping good.  She denies any irritability, anger, mania or any psychosis.  Patient reported her appetite is good and weight is unchanged from the past.  She lives with her husband and relationship is going well.  She has no tremors shakes or any EPS.  She denies any concerns from the medication.  She like to keep her Zoloft and Inderal.  She denies drinking or using any illegal substances.   Past Psychiatric History: H/O one inpatient at [facility] foranxiety and passive suicidal thoughts.Tried Celexa, Lexapro, and Xanax from PCP. H/O panic attacks. No h/oparanoia, hallucination, mania, violence, suicidal attempt.    Psychiatric Specialty Exam: Physical Exam  Review of Systems  Weight 188 lb (85.3 kg).There is no height or weight on file to calculate BMI.  General Appearance: NA  Eye Contact:  NA  Speech:  Clear and Coherent and Normal Rate  Volume:  Normal  Mood:  Anxious  Affect:  NA  Thought Process:  Goal Directed  Orientation:  Full (Time, Place, and Person)  Thought Content:  WDL  Suicidal  Thoughts:  No  Homicidal Thoughts:  No  Memory:  Immediate;   Good Recent;   Good Remote;   Good  Judgement:  Intact  Insight:  Present  Psychomotor Activity:  NA  Concentration:  Concentration: Good and Attention Span: Good  Recall:  Good  Fund of Knowledge:  Good  Language:  Good  Akathisia:  No  Handed:  Right  AIMS (if indicated):     Assets:  Communication Skills Desire for Improvement Housing Resilience Social Support Talents/Skills  ADL's:  Intact  Cognition:  WNL  Sleep:   ok      Assessment and Plan: Generalized anxiety disorder.  Panic attacks.  Patient is a stable and doing well on her current medication.  Continue Zoloft 50 mg daily and Inderal 10 mg daily.  Patient reported no side effects.  Discussed medication side effects and benefits.  Recommended to call us back if she is any question or any concern.  Follow-up in 3 months.  Follow Up Instructions:    I discussed the assessment and treatment plan with the patient. The patient was provided an opportunity to ask questions and all were answered. The patient agreed with the plan and demonstrated an understanding of the instructions.   The patient was advised to call back or seek an in-person evaluation if the symptoms worsen or if the condition fails to improve as anticipated.  I provided 16 minutes of non-face-to-face time during this encounter.   [name], MD

## 2020-07-22 ENCOUNTER — Other Ambulatory Visit (HOSPITAL_COMMUNITY): Payer: Self-pay | Admitting: Psychiatry

## 2020-07-22 DIAGNOSIS — F411 Generalized anxiety disorder: Secondary | ICD-10-CM

## 2020-09-09 ENCOUNTER — Encounter (HOSPITAL_COMMUNITY): Payer: Self-pay | Admitting: Psychiatry

## 2020-09-09 ENCOUNTER — Other Ambulatory Visit: Payer: Self-pay

## 2020-09-09 ENCOUNTER — Telehealth (INDEPENDENT_AMBULATORY_CARE_PROVIDER_SITE_OTHER): Payer: Self-pay | Admitting: Psychiatry

## 2020-09-09 DIAGNOSIS — F411 Generalized anxiety disorder: Secondary | ICD-10-CM

## 2020-09-09 DIAGNOSIS — F410 Panic disorder [episodic paroxysmal anxiety] without agoraphobia: Secondary | ICD-10-CM

## 2020-09-09 MED ORDER — PROPRANOLOL HCL 10 MG PO TABS: 10.0000 mg | ORAL_TABLET | ORAL | 0 refills | Status: DC | PRN

## 2020-09-09 MED ORDER — SERTRALINE HCL 50 MG PO TABS: 50.0000 mg | ORAL_TABLET | Freq: Every day | ORAL | 0 refills | Status: DC

## 2020-09-09 NOTE — Progress Notes (Signed)
Virtual Visit via Telephone Note  I connected with Cynthia Robertson on 09/09/20 at 10:00 AM EST by telephone and verified that I am speaking with the correct person using two identifiers.  Location: Patient: Home Provider: Home Office   I discussed the limitations, risks, security and privacy concerns of performing an evaluation and management service by telephone and the availability of in person appointments. I also discussed with the patient that there may be a patient responsible charge related to this service. The patient expressed understanding and agreed to proceed.   History of Present Illness: Patient is evaluated by phone session.  She is on the phone by herself.  She is taking Zoloft and Inderal.  She described medicine is working and denies any panic attack or any nervousness.  Her job sometimes very busy but she is happy because she is working from home.  She is sleeping good.  She had a good Thanksgiving.  She denies any crying spells or any feeling of hopelessness.  She is comfortable around people and since taking the medication has no major panic attack.  Her energy level is good.  Her appetite is okay.  She has no tremors, shakes or any EPS.  She is sleeping at least 7 to 8 hours.  She denies drinking or using any illegal substances.  She lives with her husband.  She like to keep her current medication.   [facility]: [facility].Tried Celexa, Lexapro, and Xanax from PCP. H/O panic attacks. No h/oparanoia, hallucination, mania, violence, suicidal attempt.   Psychiatric Specialty Exam: Physical Exam  Review of Systems  Weight 188 lb (85.3 kg).There is no height or weight on file to calculate BMI.  General Appearance: NA  Eye Contact:  NA  Speech:  Normal Rate  Volume:  Normal  Mood:  Euthymic  Affect:  NA  Thought Process:  Goal Directed  Orientation:  Full (Time, Place, and Person)  Thought Content:   Logical  Suicidal Thoughts:  No  Homicidal Thoughts:  No  Memory:  Immediate;   Good Recent;   Good Remote;   Good  Judgement:  Good  Insight:  Present  Psychomotor Activity:  NA  Concentration:  Concentration: Good and Attention Span: Good  Recall:  Good  Fund of Knowledge:  Good  Language:  Good  Akathisia:  No  Handed:  Right  AIMS (if indicated):     Assets:  Communication Skills Desire for Improvement Housing Resilience Social Support Talents/Skills Transportation  ADL's:  Intact  Cognition:  WNL  Sleep:   ok      Assessment and Plan: Generalized anxiety disorder.  Panic attacks.  Patient is stable on her current medication.  She does not want to change the doses.  She has no tremors, shakes or any EPS.  Discussed medication side effects and benefits.  Continue Zoloft 50 mg daily and Inderal 10 mg daily.  Recommended to call us back if she has any question or any concern.  Patient like to have her medication sent to her pharmacy which is close to her residence.  Follow-up in 3 months.  Follow Up Instructions:    I discussed the assessment and treatment plan with the patient. The patient was provided an opportunity to ask questions and all were answered. The patient agreed with the plan and demonstrated an understanding of the instructions.   The patient was advised to call back or seek an in-person evaluation if the symptoms  worsen or if the condition fails to improve as anticipated.  I provided 14 minutes of non-face-to-face time during this encounter.   [name], MD

## 2020-12-08 ENCOUNTER — Telehealth (INDEPENDENT_AMBULATORY_CARE_PROVIDER_SITE_OTHER): Payer: Self-pay | Admitting: Psychiatry

## 2020-12-08 ENCOUNTER — Other Ambulatory Visit: Payer: Self-pay

## 2020-12-08 ENCOUNTER — Encounter (HOSPITAL_COMMUNITY): Payer: Self-pay | Admitting: Psychiatry

## 2020-12-08 DIAGNOSIS — F410 Panic disorder [episodic paroxysmal anxiety] without agoraphobia: Secondary | ICD-10-CM

## 2020-12-08 DIAGNOSIS — F411 Generalized anxiety disorder: Secondary | ICD-10-CM

## 2020-12-08 MED ORDER — SERTRALINE HCL 50 MG PO TABS: 50.0000 mg | ORAL_TABLET | Freq: Every day | ORAL | 0 refills | Status: DC

## 2020-12-08 MED ORDER — PROPRANOLOL HCL 10 MG PO TABS: 10.0000 mg | ORAL_TABLET | ORAL | 0 refills | Status: DC | PRN

## 2020-12-08 NOTE — Progress Notes (Signed)
Virtual Visit via Telephone Note  I connected with Cynthia Robertson on 12/08/20 at  9:00 AM EST by telephone and verified that I am speaking with the correct person using two identifiers.  Location: Patient: Home Provider: Home Office   I discussed the limitations, risks, security and privacy concerns of performing an evaluation and management service by telephone and the availability of in person appointments. I also discussed with the patient that there may be a patient responsible charge related to this service. The patient expressed understanding and agreed to proceed.   History of Present Illness: Patient is evaluated by phone session.  She has been doing okay with her current medication.  She denies any major panic attack or nervousness.  She is more comfortable around people and she feels the medicine is helping her.  She noticed lately some time insomnia and frequent awakening but denies any crying spells, nightmares, flashback, suicidal thoughts or any worsening of anxiety.  She is working from home but now there is a possibility she may need to work in person.  Her family life is good.  She lives with her husband and there has been no issue.  Her appetite is okay.  Her weight is unchanged from the past.  She does not want to change the medication.  Her energy level is good.  She has no tremors or shakes.  Past Psych[facility]: [facility].Tried Celexa, Lexapro, and Xanax from PCP. H/O panic attacks. No h/oparanoia, hallucination, mania, violence, suicidal attempt.    Psychiatric Specialty Exam: Physical Exam  Review of Systems  Weight 186 lb (84.4 kg).There is no height or weight on file to calculate BMI.  General Appearance: NA  Eye Contact:  NA  Speech:  Clear and Coherent  Volume:  Normal  Mood:  Euthymic  Affect:  NA  Thought Process:  Goal Directed  Orientation:  Full (Time, Place, and Person)  Thought Content:   Logical  Suicidal Thoughts:  No  Homicidal Thoughts:  No  Memory:  Immediate;   Good Recent;   Good Remote;   Good  Judgement:  Good  Insight:  Good  Psychomotor Activity:  NA  Concentration:  Concentration: Good and Attention Span: Good  Recall:  Good  Fund of Knowledge:  Good  Language:  Good  Akathisia:  No  Handed:  Right  AIMS (if indicated):     Assets:  Communication Skills Desire for Improvement Housing Resilience Transportation  ADL's:  Intact  Cognition:  WNL  Sleep:   fair      Assessment and Plan: Generalized anxiety disorder.  Panic attacks with  Discussed lately having insomnia and recommend to try over-the-counter melatonin up to 10 mg if no side effects.  Explained melatonin can cause sedation and she need to be careful after taking the medication.  Discussed sleep hygiene.  Patient does not want to change the medication since it is working well.  Continue Zoloft 50 mg daily and Inderal 10 mg daily.  Recommended to call us back if she is any question or any concern.  Follow-up in 3 months.  Follow Up Instructions:    I discussed the assessment and treatment plan with the patient. The patient was provided an opportunity to ask questions and all were answered. The patient agreed with the plan and demonstrated an understanding of the instructions.   The patient was advised to call back or seek an in-person evaluation if the symptoms worsen or if  the condition fails to improve as anticipated.  I provided 16 minutes of non-face-to-face time during this encounter.   [name], MD

## 2021-02-06 ENCOUNTER — Other Ambulatory Visit (HOSPITAL_COMMUNITY): Payer: Self-pay | Admitting: Psychiatry

## 2021-02-06 DIAGNOSIS — F411 Generalized anxiety disorder: Secondary | ICD-10-CM

## 2021-02-06 DIAGNOSIS — F410 Panic disorder [episodic paroxysmal anxiety] without agoraphobia: Secondary | ICD-10-CM

## 2021-03-03 ENCOUNTER — Telehealth (HOSPITAL_COMMUNITY): Payer: Self-pay | Admitting: Psychiatry

## 2021-03-04 ENCOUNTER — Encounter (HOSPITAL_COMMUNITY): Payer: Self-pay | Admitting: Psychiatry

## 2021-03-04 ENCOUNTER — Telehealth (INDEPENDENT_AMBULATORY_CARE_PROVIDER_SITE_OTHER): Payer: 59 | Admitting: Psychiatry

## 2021-03-04 ENCOUNTER — Other Ambulatory Visit: Payer: Self-pay

## 2021-03-04 DIAGNOSIS — F410 Panic disorder [episodic paroxysmal anxiety] without agoraphobia: Secondary | ICD-10-CM

## 2021-03-04 DIAGNOSIS — F411 Generalized anxiety disorder: Secondary | ICD-10-CM

## 2021-03-04 MED ORDER — SERTRALINE HCL 50 MG PO TABS: 50.0000 mg | ORAL_TABLET | Freq: Every day | ORAL | 0 refills | Status: DC

## 2021-03-04 MED ORDER — PROPRANOLOL HCL 10 MG PO TABS: 10.0000 mg | ORAL_TABLET | ORAL | 0 refills | Status: DC | PRN

## 2021-03-04 NOTE — Progress Notes (Signed)
Virtual Visit via Telephone Note  I connected with Cynthia Robertson on 03/04/21 at 10:20 AM EDT by telephone and verified that I am speaking with the correct person using two identifiers.  Location: Patient: Work Provider: Biomedical scientist   I discussed the limitations, risks, security and privacy concerns of performing an evaluation and management service by telephone and the availability of in person appointments. I also discussed with the patient that there may be a patient responsible charge related to this service. The patient expressed understanding and agreed to proceed.   History of Present Illness: Patient is evaluated by phone session.  She is doing better on her current medication.  She denies any major panic attack.  She is now working in person and in the beginning she had some adjustment but now feeling better.  She noticed her sleep got much better since she got into normal routine to work in person.  She does not feel overwhelmed as much and more comfortable around people and crowded places.  She is working as a Freight forwarder at supply [employer] and sometime work can be stressful but she feels it is manageable.  She has no tremors, shakes or any EPS.  Her family life is good.  Her husband is very supportive.  Patient like to keep the current medication.  Past Psych[facility]: [facility].Tried Celexa, Lexapro, and Xanax from PCP. H/O panic attacks. No h/oparanoia, hallucination, mania, violence, suicidal attempt.    Psychiatric Specialty Exam: Physical Exam  Review of Systems  Weight 188 lb (85.3 kg).There is no height or weight on file to calculate BMI.  General Appearance: NA  Eye Contact:  NA  Speech:  Clear and Coherent and Normal Rate  Volume:  Normal  Mood:  Euthymic  Affect:  NA  Thought Process:  Goal Directed  Orientation:  Full (Time, Place, and Person)  Thought Content:  Logical  Suicidal Thoughts:  No  Homicidal  Thoughts:  No  Memory:  Immediate;   Good Recent;   Good Remote;   Good  Judgement:  Good  Insight:  Good  Psychomotor Activity:  NA  Concentration:  Concentration: Good and Attention Span: Good  Recall:  Good  Fund of Knowledge:  Good  Language:  Good  Akathisia:  No  Handed:  Right  AIMS (if indicated):     Assets:  Communication Skills Desire for Improvement Housing Resilience Social Support Talents/Skills Transportation  ADL's:  Intact  Cognition:  WNL  Sleep:   good      Assessment and Plan: Generalized anxiety disorder.  Panic attacks.  Patient doing better since she started working in person.  Her sleep improved and she do not taking melatonin.  She like to keep the current medication.  Continue Zoloft 50 mg daily and Inderal 10 mg daily.  Recommended to call us back if there is any question of any concern.  Follow-up in 3 months.  Follow Up Instructions:    I discussed the assessment and treatment plan with the patient. The patient was provided an opportunity to ask questions and all were answered. The patient agreed with the plan and demonstrated an understanding of the instructions.   The patient was advised to call back or seek an in-person evaluation if the symptoms worsen or if the condition fails to improve as anticipated.  I provided 17 minutes of non-face-to-face time during this encounter.   [name], MD

## 2021-06-04 ENCOUNTER — Telehealth (INDEPENDENT_AMBULATORY_CARE_PROVIDER_SITE_OTHER): Payer: 59 | Admitting: Psychiatry

## 2021-06-04 ENCOUNTER — Encounter (HOSPITAL_COMMUNITY): Payer: Self-pay | Admitting: Psychiatry

## 2021-06-04 ENCOUNTER — Other Ambulatory Visit: Payer: Self-pay

## 2021-06-04 DIAGNOSIS — F410 Panic disorder [episodic paroxysmal anxiety] without agoraphobia: Secondary | ICD-10-CM

## 2021-06-04 DIAGNOSIS — F411 Generalized anxiety disorder: Secondary | ICD-10-CM | POA: Diagnosis not present

## 2021-06-04 MED ORDER — SERTRALINE HCL 50 MG PO TABS: 50.0000 mg | ORAL_TABLET | Freq: Every day | ORAL | 0 refills | Status: DC

## 2021-06-04 MED ORDER — PROPRANOLOL HCL 10 MG PO TABS: 10.0000 mg | ORAL_TABLET | ORAL | 0 refills | Status: DC | PRN

## 2021-06-04 NOTE — Progress Notes (Signed)
Virtual Visit via Telephone Note  I connected with Cynthia Robertson on 06/04/21 at 10:00 AM EDT by telephone and verified that I am speaking with the correct person using two identifiers.  Location: Patient: work Provider: Office   I discussed the limitations, risks, security and privacy concerns of performing an evaluation and management service by telephone and the availability of in person appointments. I also discussed with the patient that there may be a patient responsible charge related to this service. The patient expressed understanding and agreed to proceed.   History of Present Illness: Patient is phone session.  She is stable on Zoloft.  She is taking Inderal but there are days when she does not need it.  She denies any major panic attack.  She sleeps good and excited about upcoming vacation, [place], [place].  Patient reported not feeling overwhelmed and anxious around people and crowded places.  She is working full days in person and job is going well.  Her family life is good.  She has no tremors, shakes or any EPS.  She checks her blood pressure every day and reported it is stable.  Her appetite is okay and her weight is unchanged from the past.  She like to keep the current medication.  Past Psychiatric History:  H/O one inpatient at [facility] for anxiety and passive suicidal thoughts. Tried Celexa, Lexapro, and Xanax from PCP. H/O panic attacks. No h/o paranoia, hallucination, mania, violence, suicidal attempt.     Psychiatric Specialty Exam: Physical Exam  Review of Systems  Weight 188 lb (85.3 kg).There is no height or weight on file to calculate BMI.  General Appearance: NA  Eye Contact:  NA  Speech:  Clear and Coherent and Normal Rate  Volume:  Normal  Mood:  Euthymic  Affect:  NA  Thought Process:  Coherent  Orientation:  Full (Time, Place, and Person)  Thought Content:  WDL  Suicidal Thoughts:  No  Homicidal Thoughts:  No  Memory:  Immediate;   Good Recent;    Good Remote;   Good  Judgement:  Good  Insight:  Good  Psychomotor Activity:  NA  Concentration:  Concentration: Good and Attention Span: Good  Recall:  Good  Fund of Knowledge:  Good  Language:  Good  Akathisia:  No  Handed:  Right  AIMS (if indicated):     Assets:  Communication Skills Desire for [place] Talents/Skills Transportation  ADL's:  Intact  Cognition:  WNL  Sleep:   good      Assessment and Plan: Generalized anxiety disorder.  Panic attacks.  Patient is stable on her current medication and reported her symptoms are she has no concern.  Some days she does not need the Inderal as she feels more calm.  I will continue Zoloft 50 mg daily and Adderall 10 mg daily.  Reminded that she need to check her blood pressure every day as it may cause fluctuation of blood pressure.  Mother to call us back if is any question or any concern.  Follow-up in 3 months.  Follow Up Instructions:    I discussed the assessment and treatment plan with the patient. The patient was provided an opportunity to ask questions and all were answered. The patient agreed with the plan and demonstrated an understanding of the instructions.   The patient was advised to call back or seek an in-person evaluation if the symptoms worsen or if the condition fails to improve as anticipated.  I provided 14 minutes of non-face-to-face time during this encounter.   Cynthia Nations, MD

## 2021-09-08 ENCOUNTER — Other Ambulatory Visit: Payer: Self-pay

## 2021-09-08 ENCOUNTER — Encounter (HOSPITAL_COMMUNITY): Payer: Self-pay | Admitting: Psychiatry

## 2021-09-08 ENCOUNTER — Telehealth (HOSPITAL_BASED_OUTPATIENT_CLINIC_OR_DEPARTMENT_OTHER): Payer: 59 | Admitting: Psychiatry

## 2021-09-08 DIAGNOSIS — F410 Panic disorder [episodic paroxysmal anxiety] without agoraphobia: Secondary | ICD-10-CM

## 2021-09-08 DIAGNOSIS — F411 Generalized anxiety disorder: Secondary | ICD-10-CM | POA: Diagnosis not present

## 2021-09-08 MED ORDER — PROPRANOLOL HCL 10 MG PO TABS: 10.0000 mg | ORAL_TABLET | ORAL | 0 refills | Status: DC | PRN

## 2021-09-08 MED ORDER — SERTRALINE HCL 50 MG PO TABS: 50.0000 mg | ORAL_TABLET | Freq: Every day | ORAL | 0 refills | Status: DC

## 2021-09-08 NOTE — Progress Notes (Signed)
Virtual Visit via Telephone Note  I connected with Cynthia Robertson on 09/08/21 at 10:00 AM EST by telephone and verified that I am speaking with the correct person using two identifiers.  Location: Patient: Work Provider: Biomedical scientist   I discussed the limitations, risks, security and privacy concerns of performing an evaluation and management service by telephone and the availability of in person appointments. I also discussed with the patient that there may be a patient responsible charge related to this service. The patient expressed understanding and agreed to proceed.   History of Present Illness: Patient is evaluated by phone session.  She is taking Zoloft and Inderal which is helping her anxiety.  She feels good because she do not have any major panic attack in past few months.  She had a good vacation to [place], [place].  Her job is going well and she is working Monday to Thursday in person.  She has no tremors, shakes or any EPS.  She admitted has not seen the PCP and physical in a while but like to schedule appointment with PCP for blood work and physical.  Patient does not want to change the medication.  Her energy level is good.  Her appetite is okay and her weight is unchanged from the past.  Past Psychiatric History:  H/O one inpatient at [facility] for anxiety and passive suicidal thoughts. Tried Celexa, Lexapro, and Xanax from PCP. H/O panic attacks. No h/o paranoia, hallucination, mania, violence, suicidal attempt.     Psychiatric Specialty Exam: Physical Exam  Review of Systems  Weight 188 lb (85.3 kg).There is no height or weight on file to calculate BMI.  General Appearance: NA  Eye Contact:  NA  Speech:  Clear and Coherent  Volume:  Normal  Mood:  Euthymic  Affect:  NA  Thought Process:  Goal Directed  Orientation:  Full (Time, Place, and Person)  Thought Content:  Logical  Suicidal Thoughts:  No  Homicidal Thoughts:  No  Memory:  Immediate;   Good Recent;   Good Remote;    Good  Judgement:  Good  Insight:  Present  Psychomotor Activity:  NA  Concentration:  Concentration: Good and Attention Span: Good  Recall:  Good  Fund of Knowledge:  Good  Language:  Good  Akathisia:  No  Handed:  Right  AIMS (if indicated):     Assets:  Communication Skills Desire for Improvement Housing Resilience Social Support Talents/Skills Transportation  ADL's:  Intact  Cognition:  WNL  Sleep:   ok      Assessment and Plan: Generalized anxiety disorder.  Panic attacks.  Patient is stable on her current medication.  She does not want to change her meds.  Continue Zoloft 50 mg daily and Inderal 10 mg daily.  Reminded that she need to check her blood pressure before she takes the Inderal.  Encouraged to make appointment with her PCP for physical and blood work.  We will follow up in 3 months however if patient remains stable will consider switching to 6 months follow-up.  Recommended to call us back if she has any question or any concern.  Follow-up in 3 months.  Follow Up Instructions:    I discussed the assessment and treatment plan with the patient. The patient was provided an opportunity to ask questions and all were answered. The patient agreed with the plan and demonstrated an understanding of the instructions.   The patient was advised to call back or seek an in-person evaluation if  the symptoms worsen or if the condition fails to improve as anticipated.  I provided 13 minutes of non-face-to-face time during this encounter.   [name], MD

## 2021-12-08 ENCOUNTER — Other Ambulatory Visit: Payer: Self-pay

## 2021-12-08 ENCOUNTER — Telehealth (HOSPITAL_BASED_OUTPATIENT_CLINIC_OR_DEPARTMENT_OTHER): Payer: 59 | Admitting: Psychiatry

## 2021-12-08 ENCOUNTER — Encounter (HOSPITAL_COMMUNITY): Payer: Self-pay | Admitting: Psychiatry

## 2021-12-08 DIAGNOSIS — F411 Generalized anxiety disorder: Secondary | ICD-10-CM

## 2021-12-08 DIAGNOSIS — F410 Panic disorder [episodic paroxysmal anxiety] without agoraphobia: Secondary | ICD-10-CM

## 2021-12-08 MED ORDER — SERTRALINE HCL 50 MG PO TABS: 50.0000 mg | ORAL_TABLET | Freq: Every day | ORAL | 1 refills | Status: DC

## 2021-12-08 MED ORDER — PROPRANOLOL HCL 10 MG PO TABS: 10.0000 mg | ORAL_TABLET | ORAL | 1 refills | Status: DC | PRN

## 2021-12-08 NOTE — Progress Notes (Signed)
Virtual Visit via Telephone Note  I connected with Cynthia Robertson on 12/08/21 at 10:20 AM EST by telephone and verified that I am speaking with the correct person using two identifiers.  Location: Patient: Work Provider: Biomedical scientist   I discussed the limitations, risks, security and privacy concerns of performing an evaluation and management service by telephone and the availability of in person appointments. I also discussed with the patient that there may be a patient responsible charge related to this service. The patient expressed understanding and agreed to proceed.   History of Present Illness: Patient is evaluated by phone session.  She is taking her medicine and she feels her anxiety depression is a stable on Zoloft and Inderal.  She sleeps good.  She continues to work Monday to Thursday in person and Friday from home.  She reported her job is very busy but manageable.  She is hoping to have some time off in June.  She denies any panic attack, crying spells or any feeling of hopelessness.  Her energy level is good.  Her appetite is okay and her weight is stable.  She is in the process of changing her insurance and waiting to get appointment with the PCP for blood work and physical.  She has no tremors, shakes or any EPS.  She denies any suicidal thoughts.   Past Psychiatric History:  H/O one inpatient at [facility] for anxiety and passive suicidal thoughts. Tried Celexa, Lexapro, and Xanax from PCP. H/O panic attacks. No h/o paranoia, hallucination, mania, violence, suicidal attempt.    Psychiatric Specialty Exam: Physical Exam  Review of Systems  Weight 188 lb (85.3 kg).There is no height or weight on file to calculate BMI.  General Appearance: NA  Eye Contact:  NA  Speech:  Normal Rate  Volume:  Normal  Mood:  Euthymic  Affect:  NA  Thought Process:  Goal Directed  Orientation:  Full (Time, Place, and Person)  Thought Content:  WDL  Suicidal Thoughts:  No  Homicidal Thoughts:  No   Memory:  Immediate;   Good Recent;   Good Remote;   Good  Judgement:  Intact  Insight:  Present  Psychomotor Activity:  Normal  Concentration:  Concentration: Good and Attention Span: Good  Recall:  Good  Fund of Knowledge:  Good  Language:  Good  Akathisia:  No  Handed:  Right  AIMS (if indicated):     Assets:  Communication Skills Desire for Improvement Housing Resilience Social Support Talents/Skills Transportation  ADL's:  Intact  Cognition:  WNL  Sleep:   ok      Assessment and Plan: Generalized anxiety disorder.  Panic attacks.  Patient is stable on her current medication.  Continue Zoloft 50 mg daily and Inderal 10 mg daily.  Patient has no concern from the medication.  She is hoping to have insurance change soon so she can get physical and blood work.  Recommended to call us back if she is any question or any concern.  Follow-up in 6 months.  Follow Up Instructions:    I discussed the assessment and treatment plan with the patient. The patient was provided an opportunity to ask questions and all were answered. The patient agreed with the plan and demonstrated an understanding of the instructions.   The patient was advised to call back or seek an in-person evaluation if the symptoms worsen or if the condition fails to improve as anticipated.  I provided 12 minutes of non-face-to-face time during this encounter.  [name], MD

## 2022-01-12 LAB — OB RESULTS CONSOLE HIV ANTIBODY (ROUTINE TESTING): HIV: NONREACTIVE

## 2022-01-12 LAB — OB RESULTS CONSOLE ANTIBODY SCREEN: Antibody Screen: NEGATIVE

## 2022-01-12 LAB — OB RESULTS CONSOLE ABO/RH: RH Type: POSITIVE

## 2022-01-12 LAB — OB RESULTS CONSOLE RPR: RPR: NONREACTIVE

## 2022-01-12 LAB — OB RESULTS CONSOLE RUBELLA ANTIBODY, IGM: Rubella: NON-IMMUNE/NOT IMMUNE

## 2022-01-12 LAB — OB RESULTS CONSOLE HEPATITIS B SURFACE ANTIGEN: Hepatitis B Surface Ag: NEGATIVE

## 2022-01-12 LAB — HEPATITIS C ANTIBODY: HCV Ab: NEGATIVE

## 2022-01-28 LAB — OB RESULTS CONSOLE GC/CHLAMYDIA
Chlamydia: NEGATIVE
Neisseria Gonorrhea: NEGATIVE

## 2022-02-10 ENCOUNTER — Emergency Department (HOSPITAL_BASED_OUTPATIENT_CLINIC_OR_DEPARTMENT_OTHER)
Admission: EM | Admit: 2022-02-10 | Discharge: 2022-02-10 | Disposition: A | Discharge status: Home / Self Care | Payer: 59 | Attending: Emergency Medicine | Admitting: Emergency Medicine

## 2022-02-10 ENCOUNTER — Emergency Department (HOSPITAL_BASED_OUTPATIENT_CLINIC_OR_DEPARTMENT_OTHER): Payer: 59

## 2022-02-10 ENCOUNTER — Encounter (HOSPITAL_BASED_OUTPATIENT_CLINIC_OR_DEPARTMENT_OTHER): Payer: Self-pay

## 2022-02-10 ENCOUNTER — Other Ambulatory Visit: Payer: Self-pay

## 2022-02-10 DIAGNOSIS — R300 Dysuria: Secondary | ICD-10-CM | POA: Diagnosis not present

## 2022-02-10 DIAGNOSIS — O219 Vomiting of pregnancy, unspecified: Secondary | ICD-10-CM | POA: Insufficient documentation

## 2022-02-10 DIAGNOSIS — O99111 Other diseases of the blood and blood-forming organs and certain disorders involving the immune mechanism complicating pregnancy, first trimester: Secondary | ICD-10-CM | POA: Diagnosis not present

## 2022-02-10 DIAGNOSIS — Z3A13 13 weeks gestation of pregnancy: Secondary | ICD-10-CM | POA: Diagnosis not present

## 2022-02-10 DIAGNOSIS — R1031 Right lower quadrant pain: Secondary | ICD-10-CM | POA: Diagnosis not present

## 2022-02-10 DIAGNOSIS — D72829 Elevated white blood cell count, unspecified: Secondary | ICD-10-CM | POA: Diagnosis not present

## 2022-02-10 DIAGNOSIS — O26891 Other specified pregnancy related conditions, first trimester: Secondary | ICD-10-CM | POA: Diagnosis not present

## 2022-02-10 DIAGNOSIS — R109 Unspecified abdominal pain: Secondary | ICD-10-CM

## 2022-02-10 LAB — BASIC METABOLIC PANEL
Anion gap: 9 (ref 5–15)
BUN: 8 mg/dL (ref 6–20)
CO2: 22 mmol/L (ref 22–32)
Calcium: 9.1 mg/dL (ref 8.9–10.3)
Chloride: 106 mmol/L (ref 98–111)
Creatinine, Ser: 0.86 mg/dL (ref 0.44–1.00)
GFR, Estimated: >60 mL/min (ref >60)
Glucose, Bld: 114 mg/dL — ABNORMAL HIGH (ref 70–99)
Potassium: 3.4 mmol/L — ABNORMAL LOW (ref 3.5–5.1)
Sodium: 137 mmol/L (ref 135–145)

## 2022-02-10 LAB — CBC
HCT: 37.8 % (ref 36.0–46.0)
Hemoglobin: 12.8 g/dL (ref 12.0–15.0)
MCH: 31.7 pg (ref 26.0–34.0)
MCHC: 33.9 g/dL (ref 30.0–36.0)
MCV: 93.6 fL (ref 80.0–100.0)
Platelets: 248 10*3/uL (ref 150–400)
RBC: 4.04 MIL/uL (ref 3.87–5.11)
RDW: 12.6 % (ref 11.5–15.5)
WBC: 11.3 10*3/uL — ABNORMAL HIGH (ref 4.0–10.5)
nRBC: 0 % (ref 0.0–0.2)

## 2022-02-10 LAB — URINALYSIS, ROUTINE W REFLEX MICROSCOPIC
Bilirubin Urine: NEGATIVE
Glucose, UA: NEGATIVE mg/dL
Ketones, ur: NEGATIVE mg/dL
Leukocytes,Ua: NEGATIVE
Nitrite: NEGATIVE
Protein, ur: NEGATIVE mg/dL
Specific Gravity, Urine: >=1.03 (ref 1.005–1.030)
pH: 5.5 (ref 5.0–8.0)

## 2022-02-10 LAB — URINALYSIS, MICROSCOPIC (REFLEX)

## 2022-02-10 LAB — HCG, QUANTITATIVE, PREGNANCY: hCG, Beta Chain, Quant, S: 68466 m[IU]/mL — ABNORMAL HIGH (ref <5)

## 2022-02-10 MED ORDER — ACETAMINOPHEN 500 MG PO TABS
1000.0000 mg | ORAL_TABLET | Freq: Once | ORAL | Status: AC
Start: 2022-02-10 — End: 2022-02-10
  Administered 2022-02-10: 1000 mg via ORAL
  Filled 2022-02-10: qty 2

## 2022-02-10 MED ORDER — CEPHALEXIN 500 MG PO CAPS: 500.0000 mg | ORAL_CAPSULE | Freq: Four times a day (QID) | ORAL | 0 refills | Status: DC

## 2022-02-10 MED ORDER — SODIUM CHLORIDE 0.9 % IV BOLUS
1000.0000 mL | Freq: Once | INTRAVENOUS | Status: AC
  Administered 2022-02-10: 1000 mL via INTRAVENOUS

## 2022-02-10 MED ORDER — CEPHALEXIN 250 MG PO CAPS
500.0000 mg | ORAL_CAPSULE | Freq: Once | ORAL | Status: AC
  Administered 2022-02-10: 500 mg via ORAL
  Filled 2022-02-10: qty 2

## 2022-02-10 NOTE — ED Triage Notes (Signed)
Pt c/o R flank pain and urinary frequency x 2 days. Pt is 13 weeks 2 days pregnant. OB/GYN is Dr. Alfred Levins. G1P0.  ?

## 2022-02-10 NOTE — ED Provider Notes (Signed)
?[facility] EMERGENCY DEPARTMENT ?Provider Note ? ? ?CSN: [id number] ?Arrival date & time: 02/10/22  1543 ? ?  ? ?History ? ?Chief Complaint  ?Patient presents with  ? Flank Pain  ? ? ?Cynthia Robertson is a 31 y.o. female. ? ?HPI ?Patient's first pregnancy.  13 weeks and 2 days.  Patient sees OB/GYN Dr. Alfred Levins.  Patient reports for symptoms yesterday were some sharp pains lancinating in her vagina.  She did not have any associated bleeding or drainage.  She reports she started getting some right flank pain yesterday midday.  At first it was aching and uncomfortable in the lower back and flank area.  It has been constant and persistent.  She reports today it was hurting more and started radiating around to her right lower abdomen.  Patient reports she has some discomfort with urination but has not had intense burning or frequency.  No fevers or chills.  Patient reports today when the pain intensified she started to get nauseated and vomited 4 times while in the waiting room.  She denies any prior history of kidney stones.  She did not try any medications for pain relief.  She denies any prior history of similar pain. ?  ? ?Home Medications ?Prior to Admission medications   ?Medication Sig Start Date End Date Taking? Authorizing Provider  ?cephALEXin (KEFLEX) 500 MG capsule Take 1 capsule (500 mg total) by mouth 4 (four) times daily. 02/10/22  Yes Charlesetta Shanks, MD  ?esomeprazole (NEXIUM) 20 MG capsule Take 20 mg by mouth daily at 12 noon.    [provider]  ?propranolol (INDERAL) 10 MG tablet Take 1 tablet (10 mg total) by mouth as needed. . 12/08/21   [name], MD  ?sertraline (ZOLOFT) 50 MG tablet Take 1 tablet (50 mg total) by mouth daily. 12/08/21 12/08/22  [name], MD  ?   ? ?Allergies    ?Patient has no known allergies.   ? ?Review of Systems   ?Review of Systems ?10 systems reviewed and negative except as per HPI ?Physical Exam ?Updated Vital Signs ?BP (!) 116/59 (BP Location: Right  Arm)   Pulse 64   Temp 98 ?F (36.7 ?C) (Oral)   Resp 16   Ht '5\' 9"'$  (1.753 m)   Wt 114.3 kg   SpO2 100%   BMI 37.21 kg/m?  ?Physical Exam ?Constitutional:   ?   Comments: Patient is alert and nontoxic.  No respiratory distress.  She is uncomfortable in appearance.  Mental status clear.  ?HENT:  ?   Mouth/Throat:  ?   Pharynx: Oropharynx is clear.  ?Eyes:  ?   Extraocular Movements: Extraocular movements intact.  ?Cardiovascular:  ?   Rate and Rhythm: Normal rate and regular rhythm.  ?Pulmonary:  ?   Effort: Pulmonary effort is normal.  ?   Breath sounds: Normal breath sounds.  ?Abdominal:  ?   Comments: No flank pain to percussion.  No rash or soft tissue changes of the back or the flank.  Abdomen is soft.  Mildly reproducible discomfort in the right lower quadrant and suprapubic area.  She reports that pressure in these areas intensifies discomfort in her back.  No guarding.  ?Musculoskeletal:     ?   General: No swelling. Normal range of motion.  ?   Right lower leg: No edema.  ?   Left lower leg: No edema.  ?Skin: ?   General: Skin is warm and dry.  ?Neurological:  ?  General: No focal deficit present.  ?   Mental Status: She is oriented to person, place, and time.  ?   Coordination: Coordination normal.  ?Psychiatric:     ?   Mood and Affect: Mood normal.  ? ? ?ED Results / Procedures / Treatments   ?Labs ?(all labs ordered are listed, but only abnormal results are displayed) ?Labs Reviewed  ?URINALYSIS, ROUTINE W REFLEX MICROSCOPIC - Abnormal; Notable for the following components:  ?    Result Value  ? APPearance CLOUDY (*)   ? Hgb urine dipstick LARGE (*)   ? All other components within normal limits  ?URINALYSIS, MICROSCOPIC (REFLEX) - Abnormal; Notable for the following components:  ? Bacteria, UA FEW (*)   ? All other components within normal limits  ?BASIC METABOLIC PANEL - Abnormal; Notable for the following components:  ? Potassium 3.4 (*)   ? Glucose, Bld 114 (*)   ? All other components within  normal limits  ?CBC - Abnormal; Notable for the following components:  ? WBC 11.3 (*)   ? All other components within normal limits  ?HCG, QUANTITATIVE, PREGNANCY - Abnormal; Notable for the following components:  ? hCG, Beta [place], [place] 68,466 (*)   ? All other components within normal limits  ?URINE CULTURE  ? ? ?EKG ?None ? ?Radiology ?US Renal ? ?Result Date: 02/10/2022 ?CLINICAL DATA:  Right lower abdominal pain EXAM: RENAL / URINARY TRACT ULTRASOUND COMPLETE COMPARISON:  None Available. FINDINGS: Right Kidney: Renal measurements: 11.3 x 4.9 x 4.6 cm = volume: 132 mL. Echogenicity within normal limits. No mass or hydronephrosis visualized. Left Kidney: Renal measurements: 10.5 by 5.4 x 5.9 cm = volume: 173 mL. Echogenicity within normal limits. No mass or hydronephrosis visualized. Bladder: Appears normal for degree of bladder distention. Other: None. IMPRESSION: There is no hydronephrosis. Electronically Signed   By: Elmer Picker M.D.   On: 02/10/2022 20:55  ? ?US OB LESS THAN 14 WEEKS WITH OB TRANSVAGINAL ? ?Result Date: 02/10/2022 ?CLINICAL DATA:  Lower abdominal pain EXAM: OBSTETRIC <14 WK [place] AND TRANSVAGINAL OB US TECHNIQUE: Both transabdominal and transvaginal ultrasound examinations were performed for complete evaluation of the gestation as well as the maternal uterus, adnexal regions, and pelvic cul-de-sac. Transvaginal technique was performed to assess early pregnancy. COMPARISON:  None Available. FINDINGS: Intrauterine gestational sac: Single Yolk sac:  Not visualized Embryo:  Visualized Cardiac Activity: Visualized Heart Rate: 160 bpm CRL:  76.3 mm   13 w   5 d                  [place] EDC: 08/13/2022 Subchorionic hemorrhage:  None visualized. Maternal uterus/adnexae: Ovaries are nonvisualized. Right fundal fibroid measuring 3.1 x 2.4 by 2.6 cm. IMPRESSION: 1. Single viable intrauterine pregnancy as above. 2. Uterine fibroid. Electronically Signed   By: Donavan Foil M.D.   On: 02/10/2022 21:06    ? ?Procedures ?Procedures  ? ? ?Medications Ordered in ED ?Medications  ?cephALEXin (KEFLEX) capsule 500 mg (has no administration in time range)  ?sodium chloride 0.9 % bolus 1,000 mL (0 mLs Intravenous Stopped 02/10/22 2046)  ?acetaminophen (TYLENOL) tablet 1,000 mg (1,000 mg Oral Given 02/10/22 1914)  ? ? ?ED Course/ Medical Decision Making/ A&P ?  ?                        ?Medical Decision Making ?Amount and/or Complexity of Data Reviewed ?Labs: ordered. ?Radiology: ordered. ? ?Risk ?OTC drugs. ?Prescription drug management. ? ?  Patient reports that she started with flank pain and lancinating vaginal pains yesterday.  Symptoms have worsened to include radiating right lower abdomen pain.  Patient is nontoxic.  She has not had fever.  Differential diagnosis includes kidney stone, complication of pregnancy, UTI, biliary colic. ? ?Urinalysis shows few RBCs.  Not conclusive for UTI.  Very mild leukocytosis.  Labs otherwise within normal limits. ? ?Ultrasound interpretation by radiology shows symmetric kidneys without hydronephrosis and pregnancy without acute complications live IUP. ? ?22: 27 patient feels improved.  She reports she still has some discomfort in her flank but feels much better.  She has not had any vomiting. Vital signs are stable. ? ?At this time, with patient improved do not feel that further imaging is indicated.  Discussed the limitations of ultrasound versus CT scan for diagnoses such as appendicitis.  However, at this time with pain improved and normally reproducible right lower quadrant pain will plan for follow-up and watchful waiting.  Patient does have symptoms of UTI with dysuria and urgency.  Will treat empirically with Keflex.  Urine culture is pending.  She has follow-up with OB tomorrow morning. ? ?Recommend the patient keep her follow-up appointment and we have reviewed careful return precautions.  Directions given for using acetaminophen for pain control and staying hydrated.  Keflex  prescribed empirically for symptoms of UTI and pregnancy. ? ? ? ? ? ? ? ?Final Clinical Impression(s) / ED Diagnoses ?Final diagnoses:  ?Flank pain  ?[redacted] weeks gestation of pregnancy  ?Dysuria  ? ? ?Rx / DC Orders ?E

## 2022-02-10 NOTE — Discharge Instructions (Signed)
1.  You may take extra strength Tylenol every 6 hours for pain.  Try to stay well-hydrated. ?2.  You had an ultrasound of your kidneys and your uterus done at the emergency department.  This time there are no significant abnormal findings, however an ultrasound does not identify all possible problems.  It is very important that you follow-up with your obstetrician as soon as possible.  You should return to the emergency department immediately if you have worsening symptoms or new concerning symptoms. ?3.  A urine culture is being done.  You describe symptoms of a urinary tract infection and therefore being started on antibiotic called Keflex.  The results of your urine culture should be available in the next 24 to 72 hours. ?

## 2022-02-12 LAB — URINE CULTURE

## 2022-03-02 ENCOUNTER — Other Ambulatory Visit: Payer: Self-pay | Admitting: Obstetrics and Gynecology

## 2022-03-02 DIAGNOSIS — Z3689 Encounter for other specified antenatal screening: Secondary | ICD-10-CM

## 2022-03-03 ENCOUNTER — Encounter: Payer: Self-pay | Admitting: *Deleted

## 2022-03-09 ENCOUNTER — Ambulatory Visit: Payer: 59 | Admitting: *Deleted

## 2022-03-09 ENCOUNTER — Ambulatory Visit: Payer: 59 | Attending: Obstetrics and Gynecology | Admitting: Obstetrics and Gynecology

## 2022-03-09 ENCOUNTER — Ambulatory Visit: Payer: 59 | Attending: Obstetrics and Gynecology

## 2022-03-09 ENCOUNTER — Other Ambulatory Visit: Payer: Self-pay | Admitting: *Deleted

## 2022-03-09 ENCOUNTER — Encounter: Payer: Self-pay | Admitting: *Deleted

## 2022-03-09 VITALS — BP 131/76 | HR 107

## 2022-03-09 DIAGNOSIS — R772 Abnormality of alphafetoprotein: Secondary | ICD-10-CM | POA: Diagnosis present

## 2022-03-09 DIAGNOSIS — O99212 Obesity complicating pregnancy, second trimester: Secondary | ICD-10-CM | POA: Diagnosis not present

## 2022-03-09 DIAGNOSIS — Z3689 Encounter for other specified antenatal screening: Secondary | ICD-10-CM

## 2022-03-09 DIAGNOSIS — D259 Leiomyoma of uterus, unspecified: Secondary | ICD-10-CM

## 2022-03-09 DIAGNOSIS — Z6838 Body mass index (BMI) 38.0-38.9, adult: Secondary | ICD-10-CM

## 2022-03-09 DIAGNOSIS — Z3A17 17 weeks gestation of pregnancy: Secondary | ICD-10-CM | POA: Diagnosis not present

## 2022-03-09 DIAGNOSIS — O3412 Maternal care for benign tumor of corpus uteri, second trimester: Secondary | ICD-10-CM | POA: Diagnosis not present

## 2022-03-09 DIAGNOSIS — O289 Unspecified abnormal findings on antenatal screening of mother: Secondary | ICD-10-CM

## 2022-03-09 NOTE — Progress Notes (Signed)
Maternal-Fetal Medicine   Name: Cynthia Robertson DOB: 02-Jun-1991 MRN: [id number] Referring Provider: Lucillie Garfinkel, MD  I had the pleasure of seeing Cynthia Robertson today at the [facility].  She was accompanied by her husband. She is G1 P0 at 17-weeks' gestation and is here for fetal anatomy scan. On serum screening, increased maternal serum alpha protein was detected (3.97 MoM; OSBR 1 in 27).  On cell-free fetal DNA screening, the risks of fetal aneuploidies are not increased.  Carrier screening is negative. Patient reports no chronic medical conditions. Her pregnancy is well dated by LMP date consistent with first trimester ultrasound.  Patient gives history of vaginal bleeding (spotting) in the first trimester.  Ultrasound We performed fetal anatomical survey.  Amniotic fluid is normal and good fetal activity seen.  Fetal biometry is consistent with the previously established dates.  No markers of aneuploidies or fetal structural defects are seen.  Intracranial structures including posterior fossa appear normal.  Fetal spine appears normal. Fetal anatomical survey is limited because of early gestational age. A small fundal myoma is seen (measurements above). Patient understands the limitations of ultrasound in detecting fetal anomalies.  Increased risk for open-neural tube defects and increased AFP I reassured the patient of normal fetal anatomy on ultrasound including spine and intracranial structures. I informed her that ultrasound can detect up to 95 out of 100 cases of spina bifida and that amniocentesis will only marginally improve the detection rate. Increased AFP can also be associated with fetal growth restriction (placental insufficiency), preterm delivery and stillbirth (very rare). I recommended serial fetal growth assessments. It can also follow vaginal bleeding.  Patient opted not to have amniocentesis.  Recommendations -An appointment was made for her to return in 4  weeks for completion of fetal anatomy and reevaluate spine. -Fetal growth assessments every 4 weeks that may be performed at your office. -Weekly BPP from [redacted] weeks gestation till delivery.  Thank you for consultation.  If you have any questions or concerns, please contact me the [facility].  Consultation including face-to-face (more than 50%) counseling 30 minutes.

## 2022-04-06 ENCOUNTER — Encounter: Payer: Self-pay | Admitting: *Deleted

## 2022-04-06 ENCOUNTER — Ambulatory Visit: Payer: 59 | Admitting: *Deleted

## 2022-04-06 ENCOUNTER — Ambulatory Visit: Payer: 59 | Attending: Obstetrics and Gynecology

## 2022-04-06 ENCOUNTER — Other Ambulatory Visit: Payer: Self-pay | Admitting: *Deleted

## 2022-04-06 VITALS — BP 130/64 | HR 76

## 2022-04-06 DIAGNOSIS — O3410 Maternal care for benign tumor of corpus uteri, unspecified trimester: Secondary | ICD-10-CM | POA: Insufficient documentation

## 2022-04-06 DIAGNOSIS — Z3A21 21 weeks gestation of pregnancy: Secondary | ICD-10-CM

## 2022-04-06 DIAGNOSIS — Z6838 Body mass index (BMI) 38.0-38.9, adult: Secondary | ICD-10-CM

## 2022-04-06 DIAGNOSIS — O35EXX0 Maternal care for other (suspected) fetal abnormality and damage, fetal genitourinary anomalies, not applicable or unspecified: Secondary | ICD-10-CM | POA: Diagnosis not present

## 2022-04-06 DIAGNOSIS — D259 Leiomyoma of uterus, unspecified: Secondary | ICD-10-CM | POA: Insufficient documentation

## 2022-04-06 DIAGNOSIS — R772 Abnormality of alphafetoprotein: Secondary | ICD-10-CM

## 2022-04-06 DIAGNOSIS — E669 Obesity, unspecified: Secondary | ICD-10-CM

## 2022-04-06 DIAGNOSIS — O3412 Maternal care for benign tumor of corpus uteri, second trimester: Secondary | ICD-10-CM

## 2022-04-06 DIAGNOSIS — O99212 Obesity complicating pregnancy, second trimester: Secondary | ICD-10-CM

## 2022-04-06 DIAGNOSIS — O280 Abnormal hematological finding on antenatal screening of mother: Secondary | ICD-10-CM | POA: Diagnosis not present

## 2022-04-06 DIAGNOSIS — Z362 Encounter for other antenatal screening follow-up: Secondary | ICD-10-CM

## 2022-05-05 ENCOUNTER — Ambulatory Visit: Payer: 59 | Admitting: *Deleted

## 2022-05-05 ENCOUNTER — Ambulatory Visit: Payer: 59 | Attending: Obstetrics and Gynecology

## 2022-05-05 ENCOUNTER — Other Ambulatory Visit: Payer: Self-pay | Admitting: *Deleted

## 2022-05-05 ENCOUNTER — Encounter: Payer: Self-pay | Admitting: *Deleted

## 2022-05-05 VITALS — BP 138/66 | HR 108

## 2022-05-05 DIAGNOSIS — O35EXX0 Maternal care for other (suspected) fetal abnormality and damage, fetal genitourinary anomalies, not applicable or unspecified: Secondary | ICD-10-CM | POA: Diagnosis not present

## 2022-05-05 DIAGNOSIS — D259 Leiomyoma of uterus, unspecified: Secondary | ICD-10-CM

## 2022-05-05 DIAGNOSIS — Z6838 Body mass index (BMI) 38.0-38.9, adult: Secondary | ICD-10-CM | POA: Insufficient documentation

## 2022-05-05 DIAGNOSIS — O99212 Obesity complicating pregnancy, second trimester: Secondary | ICD-10-CM

## 2022-05-05 DIAGNOSIS — Z3A25 25 weeks gestation of pregnancy: Secondary | ICD-10-CM

## 2022-05-05 DIAGNOSIS — O3412 Maternal care for benign tumor of corpus uteri, second trimester: Secondary | ICD-10-CM

## 2022-05-05 DIAGNOSIS — E669 Obesity, unspecified: Secondary | ICD-10-CM

## 2022-05-05 DIAGNOSIS — Z362 Encounter for other antenatal screening follow-up: Secondary | ICD-10-CM | POA: Insufficient documentation

## 2022-05-05 DIAGNOSIS — R772 Abnormality of alphafetoprotein: Secondary | ICD-10-CM | POA: Insufficient documentation

## 2022-05-05 DIAGNOSIS — O280 Abnormal hematological finding on antenatal screening of mother: Secondary | ICD-10-CM

## 2022-06-03 ENCOUNTER — Ambulatory Visit: Payer: 59 | Admitting: *Deleted

## 2022-06-03 ENCOUNTER — Other Ambulatory Visit: Payer: Self-pay | Admitting: *Deleted

## 2022-06-03 ENCOUNTER — Ambulatory Visit: Payer: 59 | Attending: Obstetrics and Gynecology

## 2022-06-03 VITALS — BP 131/70 | HR 85

## 2022-06-03 DIAGNOSIS — E669 Obesity, unspecified: Secondary | ICD-10-CM

## 2022-06-03 DIAGNOSIS — O35EXX0 Maternal care for other (suspected) fetal abnormality and damage, fetal genitourinary anomalies, not applicable or unspecified: Secondary | ICD-10-CM | POA: Insufficient documentation

## 2022-06-03 DIAGNOSIS — R772 Abnormality of alphafetoprotein: Secondary | ICD-10-CM

## 2022-06-03 DIAGNOSIS — O99213 Obesity complicating pregnancy, third trimester: Secondary | ICD-10-CM

## 2022-06-03 DIAGNOSIS — O280 Abnormal hematological finding on antenatal screening of mother: Secondary | ICD-10-CM | POA: Diagnosis not present

## 2022-06-03 DIAGNOSIS — O3663X0 Maternal care for excessive fetal growth, third trimester, not applicable or unspecified: Secondary | ICD-10-CM

## 2022-06-03 DIAGNOSIS — D259 Leiomyoma of uterus, unspecified: Secondary | ICD-10-CM | POA: Diagnosis present

## 2022-06-03 DIAGNOSIS — Z3A29 29 weeks gestation of pregnancy: Secondary | ICD-10-CM

## 2022-06-03 DIAGNOSIS — O99212 Obesity complicating pregnancy, second trimester: Secondary | ICD-10-CM | POA: Insufficient documentation

## 2022-06-03 DIAGNOSIS — Z3689 Encounter for other specified antenatal screening: Secondary | ICD-10-CM | POA: Diagnosis present

## 2022-06-03 DIAGNOSIS — O3413 Maternal care for benign tumor of corpus uteri, third trimester: Secondary | ICD-10-CM

## 2022-06-03 DIAGNOSIS — O3410 Maternal care for benign tumor of corpus uteri, unspecified trimester: Secondary | ICD-10-CM | POA: Diagnosis present

## 2022-06-03 DIAGNOSIS — R638 Other symptoms and signs concerning food and fluid intake: Secondary | ICD-10-CM

## 2022-06-07 ENCOUNTER — Telehealth (HOSPITAL_COMMUNITY): Payer: Self-pay | Admitting: *Deleted

## 2022-06-07 ENCOUNTER — Encounter (HOSPITAL_COMMUNITY): Payer: Self-pay | Admitting: Psychiatry

## 2022-06-07 ENCOUNTER — Telehealth (HOSPITAL_BASED_OUTPATIENT_CLINIC_OR_DEPARTMENT_OTHER): Payer: 59 | Admitting: Psychiatry

## 2022-06-07 VITALS — Wt 262.0 lb

## 2022-06-07 DIAGNOSIS — F411 Generalized anxiety disorder: Secondary | ICD-10-CM

## 2022-06-07 DIAGNOSIS — F410 Panic disorder [episodic paroxysmal anxiety] without agoraphobia: Secondary | ICD-10-CM

## 2022-06-07 NOTE — Progress Notes (Signed)
Virtual Visit via Telephone Note  I connected with Cynthia Robertson on 06/07/22 at 10:00 AM EDT by telephone and verified that I am speaking with the correct person using two identifiers.  Location: Patient: Home Provider: Home Office   I discussed the limitations, risks, security and privacy concerns of performing an evaluation and management service by telephone and the availability of in person appointments. I also discussed with the patient that there may be a patient responsible charge related to this service. The patient expressed understanding and agreed to proceed.   History of Present Illness: Patient is a 31 year old Caucasian, employed, married female who was seen last time 6 months ago.  Patient told she is pregnant and due on November 6.  She find out her pregnancy in March and her OB/GYN Dr. Royston Sinner recommended to stop the Inderal but continue Zoloft 50 mg daily.  So far her pregnancy is going well.  She has diagnosed with gallstone and renal stone but it is better lately.  In the beginning of the pregnancy she has nausea but now she is feeling better.  She is working now 3 days a week from home and 2 days in person.  Her level is okay.  She had good support from her husband and her mother who lives close by.  She is taking prenatal vitamin.  Since she stopped the Inderal 10 mg she has no major panic attack.  She feels the Zoloft alone helping her anxiety and panic attacks.  Her appetite is okay she gained pounds during her pregnancy.  She denies any suicidal thoughts or homicidal thoughts.  She is not seeing any therapist.  She was to keep the current medication.  She denies any hallucinations, anger, agitation or any crying.    Past Psychiatric History:  H/O one inpatient at [facility] for anxiety and passive suicidal thoughts. Tried Celexa, Lexapro, and Xanax from PCP. H/O panic attacks. No h/o paranoia, hallucination, mania, violence, suicidal attempt.    Psychiatric Specialty Exam: Physical  Exam  Review of Systems  Weight 262 lb (118.8 kg), last menstrual period 11/09/2021.There is no height or weight on file to calculate BMI.  General Appearance: NA  Eye Contact:  NA  Speech:  Clear and Coherent and Normal Rate  Volume:  Normal  Mood:  Euthymic  Affect:  NA  Thought Process:  Goal Directed  Orientation:  Full (Time, Place, and Person)  Thought Content:  WDL and Logical  Suicidal Thoughts:  No  Homicidal Thoughts:  No  Memory:  Immediate;   Good Recent;   Good Remote;   Good  Judgement:  Intact  Insight:  Good  Psychomotor Activity:  NA  Concentration:  Concentration: Good and Attention Span: Good  Recall:  Good  Fund of Knowledge:  Good  Language:  Good  Akathisia:  No  Handed:  Right  AIMS (if indicated):     Assets:  Communication Skills Desire for Improvement Housing Resilience Social Support Talents/Skills Transportation  ADL's:  Intact  Cognition:  WNL  Sleep:   ok      Assessment and Plan: Generalized anxiety disorder.  Panic attacks.  Patient is pregnant 25 can schedule to have delivery on November 6.  She excited about baby boy.  She had a good support from her mother and her husband.  She has enough Zoloft to last until her next appointment.  She like to follow-up in 3 months to closely monitor after the birth of the baby for evaluation.  We talked about postpartum blues, depression and patient agreed for a follow-up in 3 months.  She does not need a prescription of Zoloft as she still has refills remaining until her next appointment.  I also recommend if she feels that she need to see a therapist then she should let us know immediately.  We will follow up in 3 months.  We will also contact her OB/GYN for recent blood work results.  She is seeing [facility] at [facility] [place].  No new medication given on this counter.  Follow Up Instructions:    I discussed the assessment and treatment plan with the patient. The  patient was provided an opportunity to ask questions and all were answered. The patient agreed with the plan and demonstrated an understanding of the instructions.   The patient was advised to call back or seek an in-person evaluation if the symptoms worsen or if the condition fails to improve as anticipated.  Collaboration of Care: Other provider involved in patient's care AEB notes are available in epic to review.  We will contact her OB/GYN for recent blood work results.  Patient/Guardian was advised Release of Information must be obtained prior to any record release in order to collaborate their care with an outside provider. Patient/Guardian was advised if they have not already done so to contact the registration department to sign all necessary forms in order for [place] to release information regarding their care.   Consent: Patient/Guardian gives verbal consent for treatment and assignment of benefits for services provided during this visit. Patient/Guardian expressed understanding and agreed to proceed.    I provided 22 minutes of non-face-to-face time during this encounter.   [name], MD

## 2022-06-07 NOTE — Telephone Encounter (Signed)
Writer LVM for medical records at [facility] to obtain latest lab work for pt as it is not in [employer]. FYI.

## 2022-06-23 ENCOUNTER — Ambulatory Visit (HOSPITAL_BASED_OUTPATIENT_CLINIC_OR_DEPARTMENT_OTHER): Payer: 59

## 2022-06-23 ENCOUNTER — Ambulatory Visit: Payer: 59 | Attending: Maternal & Fetal Medicine | Admitting: *Deleted

## 2022-06-23 VITALS — BP 125/73 | HR 96

## 2022-06-23 DIAGNOSIS — Z3A32 32 weeks gestation of pregnancy: Secondary | ICD-10-CM | POA: Insufficient documentation

## 2022-06-23 DIAGNOSIS — R638 Other symptoms and signs concerning food and fluid intake: Secondary | ICD-10-CM

## 2022-06-23 DIAGNOSIS — Z3689 Encounter for other specified antenatal screening: Secondary | ICD-10-CM | POA: Diagnosis not present

## 2022-06-23 DIAGNOSIS — O35EXX0 Maternal care for other (suspected) fetal abnormality and damage, fetal genitourinary anomalies, not applicable or unspecified: Secondary | ICD-10-CM | POA: Diagnosis present

## 2022-06-23 DIAGNOSIS — O3663X0 Maternal care for excessive fetal growth, third trimester, not applicable or unspecified: Secondary | ICD-10-CM | POA: Diagnosis not present

## 2022-06-23 DIAGNOSIS — R772 Abnormality of alphafetoprotein: Secondary | ICD-10-CM | POA: Diagnosis not present

## 2022-06-23 DIAGNOSIS — O99213 Obesity complicating pregnancy, third trimester: Secondary | ICD-10-CM | POA: Insufficient documentation

## 2022-06-25 ENCOUNTER — Ambulatory Visit: Payer: 59

## 2022-06-25 ENCOUNTER — Other Ambulatory Visit: Payer: Self-pay

## 2022-07-01 ENCOUNTER — Ambulatory Visit: Payer: Self-pay

## 2022-07-01 ENCOUNTER — Ambulatory Visit: Payer: 59

## 2022-07-08 ENCOUNTER — Ambulatory Visit: Payer: 59

## 2022-07-08 ENCOUNTER — Encounter (HOSPITAL_COMMUNITY): Payer: Self-pay | Admitting: Obstetrics & Gynecology

## 2022-07-08 ENCOUNTER — Inpatient Hospital Stay (HOSPITAL_COMMUNITY)
Admission: AD | Admit: 2022-07-08 | Discharge: 2022-07-08 | Disposition: A | Discharge status: Home / Self Care | Payer: 59 | Attending: Obstetrics & Gynecology | Admitting: Obstetrics & Gynecology

## 2022-07-08 DIAGNOSIS — O26893 Other specified pregnancy related conditions, third trimester: Secondary | ICD-10-CM | POA: Insufficient documentation

## 2022-07-08 DIAGNOSIS — O10913 Unspecified pre-existing hypertension complicating pregnancy, third trimester: Secondary | ICD-10-CM | POA: Diagnosis not present

## 2022-07-08 DIAGNOSIS — O133 Gestational [pregnancy-induced] hypertension without significant proteinuria, third trimester: Secondary | ICD-10-CM

## 2022-07-08 DIAGNOSIS — Z3A34 34 weeks gestation of pregnancy: Secondary | ICD-10-CM

## 2022-07-08 LAB — PROTEIN / CREATININE RATIO, URINE
Creatinine, Urine: 47 mg/dL
Protein Creatinine Ratio: 0.19 mg/mg{Cre} — ABNORMAL HIGH (ref 0.00–0.15)
Total Protein, Urine: 9 mg/dL

## 2022-07-08 LAB — COMPREHENSIVE METABOLIC PANEL
ALT: 13 U/L (ref 0–44)
AST: 20 U/L (ref 15–41)
Albumin: 2.7 g/dL — ABNORMAL LOW (ref 3.5–5.0)
Alkaline Phosphatase: 108 U/L (ref 38–126)
Anion gap: 11 (ref 5–15)
BUN: 5 mg/dL — ABNORMAL LOW (ref 6–20)
CO2: 21 mmol/L — ABNORMAL LOW (ref 22–32)
Calcium: 9 mg/dL (ref 8.9–10.3)
Chloride: 106 mmol/L (ref 98–111)
Creatinine, Ser: 0.69 mg/dL (ref 0.44–1.00)
GFR, Estimated: >60 mL/min (ref >60)
Glucose, Bld: 80 mg/dL (ref 70–99)
Potassium: 3.2 mmol/L — ABNORMAL LOW (ref 3.5–5.1)
Sodium: 138 mmol/L (ref 135–145)
Total Bilirubin: 0.5 mg/dL (ref 0.3–1.2)
Total Protein: 5.9 g/dL — ABNORMAL LOW (ref 6.5–8.1)

## 2022-07-08 LAB — URINALYSIS, ROUTINE W REFLEX MICROSCOPIC
Bilirubin Urine: NEGATIVE
Glucose, UA: NEGATIVE mg/dL
Hgb urine dipstick: NEGATIVE
Ketones, ur: NEGATIVE mg/dL
Leukocytes,Ua: NEGATIVE
Nitrite: NEGATIVE
Protein, ur: NEGATIVE mg/dL
Specific Gravity, Urine: 1.005 (ref 1.005–1.030)
pH: 7 (ref 5.0–8.0)

## 2022-07-08 LAB — CBC
HCT: 31.4 % — ABNORMAL LOW (ref 36.0–46.0)
Hemoglobin: 10.7 g/dL — ABNORMAL LOW (ref 12.0–15.0)
MCH: 32.8 pg (ref 26.0–34.0)
MCHC: 34.1 g/dL (ref 30.0–36.0)
MCV: 96.3 fL (ref 80.0–100.0)
Platelets: 188 10*3/uL (ref 150–400)
RBC: 3.26 MIL/uL — ABNORMAL LOW (ref 3.87–5.11)
RDW: 14.5 % (ref 11.5–15.5)
WBC: 11 10*3/uL — ABNORMAL HIGH (ref 4.0–10.5)
nRBC: 0 % (ref 0.0–0.2)

## 2022-07-08 NOTE — H&P (Signed)
History     CSN: [id number]  Arrival date and time: 07/08/22 1259   Event Date/Time   First Provider Initiated Contact with Patient 07/08/22 1409      Chief Complaint  Patient presents with   Hypertension   Abdominal Pain   HPI Cynthia Robertson is a 31 y.o. female G103P0 17w3dpresenting for elevated blood pressure this morning. She presented to her routine OB appointment this morning and had blood pressure readings of 152/70s and then 164/80s, and was found to have protein in her urine. Her provider instructed to her present to the MAU. She reports her home blood pressure readings over the past week have read 140-160/80-90 and she just hasn't "felt right" over the past week. The patient denies any history of pre-eclampsia or chronic hypertension. She endorses daily, dull, 3-4/10 headaches that last for approximately 1 hour and resolve spontaneously since Sunday, 9/23. These headaches are located on the sides of her head, usually on the right. She has not tried any medication. Denies nausea, vomiting, and visual changes. Denies any current headache. She also notes increased swelling in her feet, legs and fingers over the past week. Denies any pain.   Finally, she reports 1 week of intermittent dull right upper quadrant pain. She notes no change in pain with eating. She has a history of both kidney and gallstones in her first trimester, and reports this current pain does not feel similar. There is positive fetal movement. Denies vaginal bleeding, loss of fluid, and contractions.   OB History     Gravida  1   Para      Term      Preterm      AB      Living         SAB      IAB      Ectopic      Multiple      Live Births              Past Medical History:  Diagnosis Date   Anxiety    Depression    Fibroid    GERD (gastroesophageal reflux disease)    History of chicken pox     Past Surgical History:  Procedure Laterality Date   NECK SURGERY     WISDOM TOOTH  EXTRACTION  04/10/2012    Family History  Problem Relation Age of Onset   Hyperlipidemia Mother    Stroke Maternal Grandfather    Depression Father    Alcohol abuse Father    Suicidality Father     Social History   Tobacco Use   Smoking status: Never   Smokeless tobacco: Never  Vaping Use   Vaping Use: Never used  Substance Use Topics   Alcohol use: Not Currently    Alcohol/week: 5.0 - 10.0 standard drinks of alcohol    Types: 5 - 10 Standard drinks or equivalent per week   Drug use: No    Allergies: No Known Allergies  Medications Prior to Admission  Medication Sig Dispense Refill Last Dose   ferrous sulfate 325 (65 FE) MG tablet Take 325 mg by mouth daily with breakfast.   07/08/2022   omeprazole (PRILOSEC) 20 MG capsule Take 20 mg by mouth daily.   07/08/2022   Prenatal Vit-Fe Fumarate-FA (PRENATAL VITAMINS PO) Take by mouth.   07/08/2022   sertraline (ZOLOFT) 50 MG tablet Take 1 tablet (50 mg total) by mouth daily. 90 tablet 1 07/08/2022    Review of Systems  Physical Exam   Blood pressure (!) 140/76, pulse 88, temperature 98.1 F (36.7 C), temperature source Oral, resp. rate 17, height 5' 8.5" (1.74 m), weight 120.9 kg, last menstrual period 11/09/2021, SpO2 97 %.  Physical Exam  MAU Course  Procedures  MDM Moderate  Assessment and Plan   Cynthia Robertson is a 31 y.o. female G38P0 95w3dpresenting for elevated blood pressure and proteinuria, sent from her OB for concern of preeclampsia. Her blood pressure on presentation was 124/77 and all other vitals are within normal limits. The patient had a benign physical exam with only +1 edema in her fingers and up to her bilateral ankles. The patient had a previous prenatal visit with blood pressure of 152/68 at 264w2dand with her elevated blood pressure in the office today, this meets the criteria of gestational hypertension. Pre-eclampsia is highest on the differential; however, her normal blood pressure on arrival and  self-resolving headaches makes this less likely. HELLP syndrome is also a consideration given her right upper quadrant pain, elevated blood pressure, and swelling, but again, her current normal blood pressure and very mild abdominal pain also makes this unlikely.   Elevated blood pressure  Current blood pressure is within normal limits and patient denies headache and visual changes. Ordering UA to assess for protein/creatinine ratio to rule out pre-eclampsia. If positive, will schedule the patient for a repeat blood pressure check in 2 days and notify her OB for weekly antenatal testing.   2.  Headache  I offered patient Tylenol 1 mg for her headache, and she reported no current headache, with 0/10 pain. Will observe and offer Tylenol as needed.   3.  RUQ pain  No current pain. Ordering CBC and CMP to evaluate platelet count and LFTs to rule out HELLP syndrome.      SyPaulo Fruit/28/2023, 2:26 PM

## 2022-07-08 NOTE — MAU Provider Note (Signed)
History     CSN: [id number]  Arrival date and time: 07/08/22 1259   Event Date/Time   First Provider Initiated Contact with Patient 07/08/22 1409      Chief Complaint  Patient presents with   Hypertension   Abdominal Pain   HPI  [name] is a 31 y.o. female G1P0 here in the office with elevated BP readings. She was seen in the [facility] office today and had elevated BP x 2. She also reports protein in her urine. She was instructed to come to MAU for further workup. She has no history of cHTN or gHTN. She reports daily, dull, mild headaches. The headaches have spontaneously resolved on their own. The last HA she had was prior to her arrival to MAU.  She denies HA now, does report swelling in her hands and feet. She reports some dull, upper right abdominal pain. She does have history of gallstones. She denies N/V or fever. She has had no change in her eating plan.   OB History     Gravida  1   Para      Term      Preterm      AB      Living         SAB      IAB      Ectopic      Multiple      Live Births              Past Medical History:  Diagnosis Date   Anxiety    Depression    Fibroid    GERD (gastroesophageal reflux disease)    History of chicken pox     Past Surgical History:  Procedure Laterality Date   NECK SURGERY     WISDOM TOOTH EXTRACTION  04/10/2012    Family History  Problem Relation Age of Onset   Hyperlipidemia Mother    Stroke Maternal Grandfather    Depression Father    Alcohol abuse Father    Suicidality Father     Social History   Tobacco Use   Smoking status: Never   Smokeless tobacco: Never  Vaping Use   Vaping Use: Never used  Substance Use Topics   Alcohol use: Not Currently    Alcohol/week: 5.0 - 10.0 standard drinks of alcohol    Types: 5 - 10 Standard drinks or equivalent per week   Drug use: No    Allergies: No Known Allergies  No medications prior to admission.   Results for orders placed  or performed during the hospital encounter of 07/08/22 (from the past 48 hour(s))  Urinalysis, Routine w reflex microscopic Urine, Clean Catch     Status: None   Collection Time: 07/08/22  1:17 PM  Result Value Ref Range   Color, Urine YELLOW YELLOW   APPearance CLEAR CLEAR   Specific Gravity, Urine 1.005 1.005 - 1.030   pH 7.0 5.0 - 8.0   Glucose, UA NEGATIVE NEGATIVE mg/dL   Hgb urine dipstick NEGATIVE NEGATIVE   Bilirubin Urine NEGATIVE NEGATIVE   Ketones, ur NEGATIVE NEGATIVE mg/dL   Protein, ur NEGATIVE NEGATIVE mg/dL   Nitrite NEGATIVE NEGATIVE   Leukocytes,Ua NEGATIVE NEGATIVE    Comment: Performed at [facility] [address], [place], [place] [address]  Protein / creatinine ratio, urine     Status: Abnormal   Collection Time: 07/08/22  1:45 PM  Result Value Ref Range   Creatinine, Urine 47 mg/dL  Total Protein, Urine 9 mg/dL    Comment: NO NORMAL RANGE ESTABLISHED FOR THIS TEST   Protein Creatinine Ratio 0.19 (H) 0.00 - 0.15 mg/mg[Cre]    Comment: Performed at [facility] [address] [place], [place] [address]  CBC     Status: Abnormal   Collection Time: 07/08/22  2:19 PM  Result Value Ref Range   WBC 11.0 (H) 4.0 - 10.5 K/uL   RBC 3.26 (L) 3.87 - 5.11 MIL/uL   Hemoglobin 10.7 (L) 12.0 - 15.0 g/dL   HCT 31.4 (L) 36.0 - 46.0 %   MCV 96.3 80.0 - 100.0 fL   MCH 32.8 26.0 - 34.0 pg   MCHC 34.1 30.0 - 36.0 g/dL   RDW 14.5 11.5 - 15.5 %   Platelets 188 150 - 400 K/uL   nRBC 0.0 0.0 - 0.2 %    Comment: Performed at [facility], [place] [address] [place], [place] [address]  Comprehensive metabolic panel     Status: Abnormal   Collection Time: 07/08/22  2:19 PM  Result Value Ref Range   Sodium 138 135 - 145 mmol/L   Potassium 3.2 (L) 3.5 - 5.1 mmol/L   Chloride 106 98 - 111 mmol/L   CO2 21 (L) 22 - 32 mmol/L   Glucose, Bld 80 70 - 99 mg/dL    Comment: Glucose reference range applies only to samples taken after fasting for at least 8 hours.    BUN 5 (L) 6 - 20 mg/dL   Creatinine, Ser 0.69 0.44 - 1.00 mg/dL   Calcium 9.0 8.9 - 10.3 mg/dL   Total Protein 5.9 (L) 6.5 - 8.1 g/dL   Albumin 2.7 (L) 3.5 - 5.0 g/dL   AST 20 15 - 41 U/L   ALT 13 0 - 44 U/L   Alkaline Phosphatase 108 38 - 126 U/L   Total Bilirubin 0.5 0.3 - 1.2 mg/dL   GFR, Estimated >60 >60 mL/min    Comment: (NOTE) Calculated using the CKD-EPI Creatinine Equation (2021)    Anion gap 11 5 - 15    Comment: Performed at [facility], [place] [address], [place], [place] [address]     Review of Systems  Constitutional:  Negative for fever.  Eyes:  Negative for photophobia.  Gastrointestinal:  Positive for abdominal pain.  Genitourinary:  Negative for vaginal bleeding.  Neurological:  Negative for headaches.   Physical Exam   Blood pressure 133/84, pulse 88, temperature 98.1 F (36.7 C), temperature source Oral, resp. rate 17, height 5' 8.5" (1.74 m), weight 120.9 kg, last menstrual period 11/09/2021, SpO2 97 %.  Patient Vitals for the past 24 hrs:  BP Temp Temp src Pulse Resp SpO2 Height Weight  07/08/22 1531 133/84 -- -- 88 -- -- -- --  07/08/22 1516 132/66 -- -- 92 -- -- -- --  07/08/22 1501 (!) 141/73 -- -- 92 -- -- -- --  07/08/22 1446 133/78 -- -- 84 -- -- -- --  07/08/22 1431 124/77 -- -- 93 -- -- -- --  07/08/22 1416 137/70 -- -- 88 -- -- -- --  07/08/22 1401 (!) 140/76 -- -- 88 -- -- -- --  07/08/22 1346 (!) 141/74 -- -- 79 -- -- -- --  07/08/22 1337 132/75 -- -- 88 -- -- -- --  07/08/22 1314 133/67 98.1 F (36.7 C) Oral 87 17 97 % 5' 8.5" (1.74 m) 120.9 kg     Physical Exam Constitutional:  General: She is not in acute distress.    Appearance: She is well-developed. She is not ill-appearing, toxic-appearing or diaphoretic.  Abdominal:     Palpations: Abdomen is soft. There is no shifting dullness or hepatomegaly.     Tenderness: There is no abdominal tenderness.  Skin:    General: Skin is warm.     Capillary Refill: Capillary refill  takes more than 3 seconds.  Neurological:     Mental Status: She is alert and oriented to person, place, and time.     Deep Tendon Reflexes: Reflexes normal.     Comments: Negative clonus   Psychiatric:        Mood and Affect: Mood normal.    Fetal Tracing: Baseline: 125 bpm Variability: Moderate  Accelerations: 15x15 Decelerations: None Toco: None MAU Course  Procedures  MDM  3 elevated BP's noted in MAU, with some normal BP's.  [place] labs collected and reviewed.  HA 0/10  Assessment and Plan    A:  1. Gestational hypertension, third trimester   2. [redacted] weeks gestation of pregnancy      P:  Dc home Discussed f/u with Dr. Lynnette Caffey. Patient will call the office to schedule a BP check on Monday Strict return precautions. Pre E precautions.  Lezlie Lye, NP 07/08/2022 8:35 PM

## 2022-07-08 NOTE — MAU Note (Signed)
...  Cynthia Robertson Cynthia Robertson is a 31 y.o. at 34w3dhere in MAU reporting: Sent from the office for elevated BP's. Endorses RUQ pain that is intermittent and dull for the past week. Denies VB or LOF. +FM.  Pain score: 2/10 RUQ - intermittent   FHT: 145 initial external Lab orders placed from triage:  UA

## 2022-07-14 LAB — OB RESULTS CONSOLE GBS: GBS: POSITIVE

## 2022-07-15 ENCOUNTER — Ambulatory Visit: Payer: 59

## 2022-07-15 ENCOUNTER — Other Ambulatory Visit: Payer: 59

## 2022-07-20 ENCOUNTER — Telehealth (HOSPITAL_COMMUNITY): Payer: Self-pay | Admitting: *Deleted

## 2022-07-20 NOTE — Telephone Encounter (Signed)
Preadmission screen  

## 2022-07-21 ENCOUNTER — Encounter (HOSPITAL_COMMUNITY): Payer: Self-pay | Admitting: *Deleted

## 2022-07-28 NOTE — H&P (Signed)
Cynthia Robertson is a 31 y.o. female presenting for IOL s/s hypertension. Gestational HTN vs chronic HTN. Labetalol 200mg  BID was initiated at time of dx and BP has been stable since. She has been without s/s severe disease. Pregnancy c/b: # Hypertension as above  # Elevated AFP and she has had reassuring antenatal testing.   # ATD A1 - bilateral that RESOLVED at 68 wga  # Anxiety c/w sertraline 50 mg  # Rubella Non-immune -MMR pp planned  # LGA - [place] EFW 10/12 at 36.3 wga 7#4 (85%ile)  # GERD - prilosec  # GBS positive  OB History     Gravida  1   Para      Term      Preterm      AB      Living         SAB      IAB      Ectopic      Multiple      Live Births             Past Medical History:  Diagnosis Date   Anxiety    Depression    Fibroid    GERD (gastroesophageal reflux disease)    History of chicken pox    Pregnancy induced hypertension    Past Surgical History:  Procedure Laterality Date   NECK SURGERY     WISDOM TOOTH EXTRACTION  04/10/2012   Family History: family history includes Alcohol abuse in her father; Depression in her father; Hyperlipidemia in her mother; Stroke in her maternal grandfather; Suicidality in her father. Social History:  reports that she has never smoked. She has never used smokeless tobacco. She reports that she does not currently use alcohol after a past usage of about 5.0 - 10.0 standard drinks of alcohol per week. She reports that she does not use drugs.     Maternal Diabetes: No Genetic Screening: Normal Maternal Ultrasounds/Referrals: Normal Fetal Ultrasounds or other Referrals:  Other: resolved UTD Maternal Substance Abuse:  No Significant Maternal Medications:  Meds include: Other:  Labetalol, sertraline, and prilosec Significant Maternal Lab Results:  Group B Strep positive Number of Prenatal Visits:greater than 3 verified prenatal visits Other Comments:  None  Review of Systems History   Last  menstrual period 11/09/2021. Exam Physical Exam  (from office) NAD, A&O NWOB Abd soft, nondistended, gravid  Prenatal labs: ABO, Rh: O/Positive/-- (04/04 0000) Antibody: Negative (04/04 0000) Rubella: Nonimmune (04/04 0000) RPR: Nonreactive (04/04 0000)  HBsAg: Negative (04/04 0000)  HIV: Non-reactive (04/04 0000)  GBS: Positive/-- (10/04 0000)   Assessment/Plan: 31 yo G1P0 @ 57 wga presenting for IOL s/s hypertensive disease. [place] labs on admission Continue Labetalol 200mg  BID Continue other home meds GBS positive - PCN per protocol    Tyson Dense 07/28/2022, 1:13 PM

## 2022-07-29 ENCOUNTER — Inpatient Hospital Stay (HOSPITAL_COMMUNITY): Payer: 59

## 2022-07-30 ENCOUNTER — Encounter (HOSPITAL_COMMUNITY)
Admission: AD | Disposition: A | Discharge status: Home / Self Care | Payer: Self-pay | Source: Home / Self Care | Attending: Obstetrics and Gynecology

## 2022-07-30 ENCOUNTER — Other Ambulatory Visit: Payer: Self-pay

## 2022-07-30 ENCOUNTER — Encounter (HOSPITAL_COMMUNITY): Payer: Self-pay | Admitting: Obstetrics and Gynecology

## 2022-07-30 ENCOUNTER — Inpatient Hospital Stay (HOSPITAL_COMMUNITY): Payer: 59 | Admitting: Anesthesiology

## 2022-07-30 ENCOUNTER — Inpatient Hospital Stay (HOSPITAL_COMMUNITY)
Admission: AD | Admit: 2022-07-30 | Discharge: 2022-08-02 | DRG: 787 | Disposition: A | Discharge status: Home / Self Care | Payer: 59 | Attending: Obstetrics and Gynecology | Admitting: Obstetrics and Gynecology

## 2022-07-30 DIAGNOSIS — F419 Anxiety disorder, unspecified: Secondary | ICD-10-CM | POA: Diagnosis present

## 2022-07-30 DIAGNOSIS — O99824 Streptococcus B carrier state complicating childbirth: Secondary | ICD-10-CM | POA: Diagnosis present

## 2022-07-30 DIAGNOSIS — O9962 Diseases of the digestive system complicating childbirth: Secondary | ICD-10-CM | POA: Diagnosis present

## 2022-07-30 DIAGNOSIS — O9081 Anemia of the puerperium: Secondary | ICD-10-CM | POA: Diagnosis not present

## 2022-07-30 DIAGNOSIS — O3663X0 Maternal care for excessive fetal growth, third trimester, not applicable or unspecified: Secondary | ICD-10-CM | POA: Diagnosis present

## 2022-07-30 DIAGNOSIS — O99344 Other mental disorders complicating childbirth: Secondary | ICD-10-CM | POA: Diagnosis present

## 2022-07-30 DIAGNOSIS — K219 Gastro-esophageal reflux disease without esophagitis: Secondary | ICD-10-CM | POA: Diagnosis present

## 2022-07-30 DIAGNOSIS — O134 Gestational [pregnancy-induced] hypertension without significant proteinuria, complicating childbirth: Secondary | ICD-10-CM | POA: Diagnosis present

## 2022-07-30 DIAGNOSIS — D62 Acute posthemorrhagic anemia: Secondary | ICD-10-CM | POA: Diagnosis not present

## 2022-07-30 DIAGNOSIS — Z3A37 37 weeks gestation of pregnancy: Secondary | ICD-10-CM | POA: Diagnosis not present

## 2022-07-30 DIAGNOSIS — O1092 Unspecified pre-existing hypertension complicating childbirth: Secondary | ICD-10-CM | POA: Diagnosis not present

## 2022-07-30 DIAGNOSIS — Z3490 Encounter for supervision of normal pregnancy, unspecified, unspecified trimester: Principal | ICD-10-CM

## 2022-07-30 LAB — COMPREHENSIVE METABOLIC PANEL
ALT: 18 U/L (ref 0–44)
AST: 28 U/L (ref 15–41)
Albumin: 2.6 g/dL — ABNORMAL LOW (ref 3.5–5.0)
Alkaline Phosphatase: 130 U/L — ABNORMAL HIGH (ref 38–126)
Anion gap: 9 (ref 5–15)
BUN: 6 mg/dL (ref 6–20)
CO2: 20 mmol/L — ABNORMAL LOW (ref 22–32)
Calcium: 9 mg/dL (ref 8.9–10.3)
Chloride: 108 mmol/L (ref 98–111)
Creatinine, Ser: 0.73 mg/dL (ref 0.44–1.00)
GFR, Estimated: >60 mL/min (ref >60)
Glucose, Bld: 110 mg/dL — ABNORMAL HIGH (ref 70–99)
Potassium: 3.1 mmol/L — ABNORMAL LOW (ref 3.5–5.1)
Sodium: 137 mmol/L (ref 135–145)
Total Bilirubin: 0.4 mg/dL (ref 0.3–1.2)
Total Protein: 5.7 g/dL — ABNORMAL LOW (ref 6.5–8.1)

## 2022-07-30 LAB — CBC
HCT: 34 % — ABNORMAL LOW (ref 36.0–46.0)
HCT: 33.3 % — ABNORMAL LOW (ref 36.0–46.0)
Hemoglobin: 11.2 g/dL — ABNORMAL LOW (ref 12.0–15.0)
Hemoglobin: 11.3 g/dL — ABNORMAL LOW (ref 12.0–15.0)
MCH: 32.7 pg (ref 26.0–34.0)
MCH: 32.8 pg (ref 26.0–34.0)
MCHC: 32.9 g/dL (ref 30.0–36.0)
MCHC: 33.9 g/dL (ref 30.0–36.0)
MCV: 99.1 fL (ref 80.0–100.0)
MCV: 96.5 fL (ref 80.0–100.0)
Platelets: 171 10*3/uL (ref 150–400)
Platelets: 176 10*3/uL (ref 150–400)
RBC: 3.43 MIL/uL — ABNORMAL LOW (ref 3.87–5.11)
RBC: 3.45 MIL/uL — ABNORMAL LOW (ref 3.87–5.11)
RDW: 14.7 % (ref 11.5–15.5)
RDW: 14.7 % (ref 11.5–15.5)
WBC: 10.1 10*3/uL (ref 4.0–10.5)
WBC: 11.8 10*3/uL — ABNORMAL HIGH (ref 4.0–10.5)
nRBC: 0 % (ref 0.0–0.2)
nRBC: 0 % (ref 0.0–0.2)

## 2022-07-30 LAB — TYPE AND SCREEN
ABO/RH(D): O POS
Antibody Screen: NEGATIVE

## 2022-07-30 LAB — RPR: RPR Ser Ql: NONREACTIVE

## 2022-07-30 SURGERY — Surgical Case
Anesthesia: Epidural | Site: Abdomen | Wound class: Clean Contaminated

## 2022-07-30 MED ORDER — HYDROXYZINE HCL 50 MG PO TABS: 50.0000 mg | ORAL_TABLET | Freq: Four times a day (QID) | ORAL | Status: DC | PRN

## 2022-07-30 MED ORDER — SODIUM CHLORIDE 0.9 % IV SOLN
5.0000 10*6.[IU] | Freq: Once | INTRAVENOUS | Status: AC
  Administered 2022-07-30: 5 10*6.[IU] via INTRAVENOUS
  Filled 2022-07-30: qty 5

## 2022-07-30 MED ORDER — PENICILLIN G POT IN DEXTROSE 60000 UNIT/ML IV SOLN
3.0000 10*6.[IU] | INTRAVENOUS | Status: DC
  Administered 2022-07-30 (×5): 3 10*6.[IU] via INTRAVENOUS
  Filled 2022-07-30 (×5): qty 50

## 2022-07-30 MED ORDER — ONDANSETRON HCL 4 MG/2ML IJ SOLN
INTRAMUSCULAR | Status: DC | PRN
  Administered 2022-07-30: 4 mg via INTRAVENOUS

## 2022-07-30 MED ORDER — LACTATED RINGERS IV SOLN: 500.0000 mL | Freq: Once | INTRAVENOUS | Status: DC

## 2022-07-30 MED ORDER — SERTRALINE HCL 50 MG PO TABS
50.0000 mg | ORAL_TABLET | Freq: Every day | ORAL | Status: DC
  Filled 2022-07-30: qty 1

## 2022-07-30 MED ORDER — FENTANYL-BUPIVACAINE-NACL 0.5-0.125-0.9 MG/250ML-% EP SOLN
EPIDURAL | Status: DC | PRN
  Administered 2022-07-30: 12 mL/h via EPIDURAL

## 2022-07-30 MED ORDER — OXYCODONE-ACETAMINOPHEN 5-325 MG PO TABS: 2.0000 | ORAL_TABLET | ORAL | Status: DC | PRN

## 2022-07-30 MED ORDER — LIDOCAINE HCL (PF) 1 % IJ SOLN
INTRAMUSCULAR | Status: DC | PRN
  Administered 2022-07-30: 5 mL via EPIDURAL

## 2022-07-30 MED ORDER — OXYTOCIN-SODIUM CHLORIDE 30-0.9 UT/500ML-% IV SOLN: 2.5000 [IU]/h | INTRAVENOUS | Status: DC

## 2022-07-30 MED ORDER — ZOLPIDEM TARTRATE 5 MG PO TABS: 5.0000 mg | ORAL_TABLET | Freq: Every evening | ORAL | Status: DC | PRN

## 2022-07-30 MED ORDER — LACTATED RINGERS IV SOLN: 500.0000 mL | INTRAVENOUS | Status: DC | PRN

## 2022-07-30 MED ORDER — LIDOCAINE HCL (PF) 1 % IJ SOLN: 30.0000 mL | INTRAMUSCULAR | Status: DC | PRN

## 2022-07-30 MED ORDER — PHENYLEPHRINE 80 MCG/ML (10ML) SYRINGE FOR IV PUSH (FOR BLOOD PRESSURE SUPPORT)
80.0000 ug | PREFILLED_SYRINGE | INTRAVENOUS | Status: DC | PRN
  Filled 2022-07-30: qty 10

## 2022-07-30 MED ORDER — FENTANYL-BUPIVACAINE-NACL 0.5-0.125-0.9 MG/250ML-% EP SOLN
12.0000 mL/h | EPIDURAL | Status: DC | PRN
  Filled 2022-07-30: qty 250

## 2022-07-30 MED ORDER — LACTATED RINGERS IV SOLN: INTRAVENOUS | Status: DC

## 2022-07-30 MED ORDER — EPHEDRINE 5 MG/ML INJ
10.0000 mg | INTRAVENOUS | Status: DC | PRN
  Filled 2022-07-30: qty 5

## 2022-07-30 MED ORDER — ONDANSETRON HCL 4 MG/2ML IJ SOLN
4.0000 mg | Freq: Four times a day (QID) | INTRAMUSCULAR | Status: DC | PRN
  Filled 2022-07-30: qty 2

## 2022-07-30 MED ORDER — ACETAMINOPHEN 325 MG PO TABS
650.0000 mg | ORAL_TABLET | ORAL | Status: DC | PRN
  Administered 2022-07-30: 650 mg via ORAL
  Filled 2022-07-30: qty 2

## 2022-07-30 MED ORDER — SERTRALINE HCL 50 MG PO TABS: 50.0000 mg | ORAL_TABLET | Freq: Every day | ORAL | Status: DC

## 2022-07-30 MED ORDER — PHENYLEPHRINE 80 MCG/ML (10ML) SYRINGE FOR IV PUSH (FOR BLOOD PRESSURE SUPPORT): 80.0000 ug | PREFILLED_SYRINGE | INTRAVENOUS | Status: DC | PRN

## 2022-07-30 MED ORDER — DIPHENHYDRAMINE HCL 50 MG/ML IJ SOLN: 12.5000 mg | INTRAMUSCULAR | Status: DC | PRN

## 2022-07-30 MED ORDER — EPHEDRINE 5 MG/ML INJ
10.0000 mg | INTRAVENOUS | Status: AC | PRN
  Administered 2022-07-30 (×2): 10 mg via INTRAVENOUS

## 2022-07-30 MED ORDER — OXYTOCIN-SODIUM CHLORIDE 30-0.9 UT/500ML-% IV SOLN
1.0000 m[IU]/min | INTRAVENOUS | Status: DC
  Administered 2022-07-30: 2 m[IU]/min via INTRAVENOUS
  Filled 2022-07-30: qty 500

## 2022-07-30 MED ORDER — TERBUTALINE SULFATE 1 MG/ML IJ SOLN
0.2500 mg | Freq: Once | INTRAMUSCULAR | Status: DC | PRN
  Filled 2022-07-30: qty 1

## 2022-07-30 MED ORDER — OXYCODONE-ACETAMINOPHEN 5-325 MG PO TABS: 1.0000 | ORAL_TABLET | ORAL | Status: DC | PRN

## 2022-07-30 MED ORDER — FENTANYL CITRATE (PF) 100 MCG/2ML IJ SOLN
INTRAMUSCULAR | Status: AC
  Filled 2022-07-30: qty 2

## 2022-07-30 MED ORDER — MISOPROSTOL 50MCG HALF TABLET
50.0000 ug | ORAL_TABLET | ORAL | Status: DC
  Administered 2022-07-30 (×4): 50 ug via ORAL
  Filled 2022-07-30 (×4): qty 1

## 2022-07-30 MED ORDER — LABETALOL HCL 200 MG PO TABS
200.0000 mg | ORAL_TABLET | Freq: Two times a day (BID) | ORAL | Status: DC
  Administered 2022-07-30: 200 mg via ORAL
  Filled 2022-07-30 (×2): qty 1

## 2022-07-30 MED ORDER — OXYTOCIN BOLUS FROM INFUSION: 333.0000 mL | Freq: Once | INTRAVENOUS | Status: DC

## 2022-07-30 MED ORDER — SOD CITRATE-CITRIC ACID 500-334 MG/5ML PO SOLN
30.0000 mL | ORAL | Status: DC | PRN
  Administered 2022-07-30: 30 mL via ORAL
  Filled 2022-07-30: qty 30

## 2022-07-30 MED ORDER — FENTANYL CITRATE (PF) 100 MCG/2ML IJ SOLN: 50.0000 ug | INTRAMUSCULAR | Status: DC | PRN

## 2022-07-30 SURGICAL SUPPLY — 35 items
ADH SKN CLS APL DERMABOND .7 (GAUZE/BANDAGES/DRESSINGS)
APL SKNCLS STERI-STRIP NONHPOA (GAUZE/BANDAGES/DRESSINGS) ×1
BENZOIN TINCTURE PRP APPL 2/3 (GAUZE/BANDAGES/DRESSINGS) IMPLANT
CHLORAPREP W/TINT 26ML (MISCELLANEOUS) ×2 IMPLANT
CLAMP UMBILICAL CORD (MISCELLANEOUS) ×1 IMPLANT
CLOTH BEACON ORANGE TIMEOUT ST (SAFETY) ×1 IMPLANT
CLSR STERI-STRIP ANTIMIC 1/2X4 (GAUZE/BANDAGES/DRESSINGS) IMPLANT
DERMABOND ADVANCED .7 DNX12 (GAUZE/BANDAGES/DRESSINGS) IMPLANT
DRSG OPSITE POSTOP 4X10 (GAUZE/BANDAGES/DRESSINGS) ×1 IMPLANT
ELECT REM PT RETURN 9FT ADLT (ELECTROSURGICAL) ×1
ELECTRODE REM PT RTRN 9FT ADLT (ELECTROSURGICAL) ×1 IMPLANT
EXTRACTOR VACUUM M CUP 4 TUBE (SUCTIONS) IMPLANT
GLOVE BIO SURGEON STRL SZ7.5 (GLOVE) ×1 IMPLANT
GLOVE BIOGEL PI IND STRL 7.0 (GLOVE) ×1 IMPLANT
GOWN STRL REUS W/TWL LRG LVL3 (GOWN DISPOSABLE) ×2 IMPLANT
KIT ABG SYR 3ML LUER SLIP (SYRINGE) ×1 IMPLANT
NDL HYPO 25X5/8 SAFETYGLIDE (NEEDLE) ×1 IMPLANT
NEEDLE HYPO 25X5/8 SAFETYGLIDE (NEEDLE) ×1 IMPLANT
NS IRRIG 1000ML POUR BTL (IV SOLUTION) ×1 IMPLANT
PACK C SECTION WH (CUSTOM PROCEDURE TRAY) ×1 IMPLANT
PAD ABD 7.5X8 STRL (GAUZE/BANDAGES/DRESSINGS) IMPLANT
PAD OB MATERNITY 4.3X12.25 (PERSONAL CARE ITEMS) ×1 IMPLANT
RETAINER VISCERAL (MISCELLANEOUS) IMPLANT
STRIP CLOSURE SKIN 1/2X4 (GAUZE/BANDAGES/DRESSINGS) IMPLANT
SUT MNCRL 0 VIOLET CTX 36 (SUTURE) ×4 IMPLANT
SUT MONOCRYL 0 CTX 36 (SUTURE) ×4
SUT PDS AB 0 CTX 60 (SUTURE) ×1 IMPLANT
SUT PLAIN 0 NONE (SUTURE) IMPLANT
SUT PLAIN 2 0 (SUTURE)
SUT PLAIN 2 0 XLH (SUTURE) IMPLANT
SUT PLAIN ABS 2-0 CT1 27XMFL (SUTURE) IMPLANT
SUT VIC AB 4-0 KS 27 (SUTURE) ×1 IMPLANT
TOWEL OR 17X24 6PK STRL BLUE (TOWEL DISPOSABLE) ×1 IMPLANT
TRAY FOLEY W/BAG SLVR 14FR LF (SET/KITS/TRAYS/PACK) ×1 IMPLANT
WATER STERILE IRR 1000ML POUR (IV SOLUTION) ×1 IMPLANT

## 2022-07-30 NOTE — Progress Notes (Signed)
Provider Andres Shad was called as patient was complaining of chest numbness and tightness. Pulse Ox is on and 02 sats are above 95%, position changes to high fowlers, patient has unlabored breathing.  Anesthesia is en route.

## 2022-07-30 NOTE — Progress Notes (Signed)
FHT cat one UCs irregular, moderate BSUS>VTX S/P miso #2 about 1 hour ago  D/W patient and husband two stage IOL All questions answered. She states she understands and agrees

## 2022-07-30 NOTE — Progress Notes (Signed)
FHT cat one UCs q2-4 min Cx 1/70/-2/vtx Patient received another misoprostol dose about 4 hours ago. Will now start pitocin. D/W patient.

## 2022-07-30 NOTE — Progress Notes (Signed)
FHT cat one UCs irregular Cx dimple/50/-2/well applied Misoprostol x 1 more dose then pitocin D/W patient

## 2022-07-30 NOTE — Progress Notes (Signed)
FHT repetitive late decels since pitocin started and a 2 minute decel to 38s Pitocin now off  A/P: non reassuring FHT-recommend cesarean section D/W procedure and risks including infection, organ damage, bleeding/transfusion-HIV/Hep, DVT/PE, pneumonia, wound breakdown Patient states she understands and agrees.

## 2022-07-30 NOTE — Progress Notes (Signed)
Patient wanted epidural prior to starting pitocin Epidural placed Patient C/O of some SOB Baby then had an episode of bradycardia to 60s. Cx unchanged Treated with position change, IV fluids, ephedrine x 2 After second dose of ephedrine the FHT recovered. It was < 90 for < 5 minutes. Now FHT 130-140s O2 sat 98% on RA D/W patient-will observe for another 45 minutes and then consider starting pitocin.

## 2022-07-30 NOTE — Anesthesia Preprocedure Evaluation (Signed)
Anesthesia Evaluation  Patient identified by MRN, date of birth, ID band Patient awake    Reviewed: Allergy & Precautions, NPO status , Patient's Chart, lab work & pertinent test results  Airway Mallampati: III  TM Distance: >3 FB Neck ROM: Full    Dental no notable dental hx. (+) Teeth Intact, Dental Advisory Given   Pulmonary neg pulmonary ROS,    Pulmonary exam normal breath sounds clear to auscultation       Cardiovascular hypertension (gHtn), Normal cardiovascular exam Rhythm:Regular Rate:Normal     Neuro/Psych negative neurological ROS     GI/Hepatic negative GI ROS, Neg liver ROS,   Endo/Other  negative endocrine ROS  Renal/GU negative Renal ROS     Musculoskeletal   Abdominal (+) + obese (BMI 41.3),   Peds  Hematology Lab Results      Component                Value               Date                      WBC                      11.8 (H)            07/30/2022                HGB                      11.3 (L)            07/30/2022                HCT                      33.3 (L)            07/30/2022                MCV                      96.5                07/30/2022                PLT                      176                 07/30/2022              Anesthesia Other Findings   Reproductive/Obstetrics (+) Pregnancy                             Anesthesia Physical Anesthesia Plan  ASA: 3  Anesthesia Plan: Epidural   Post-op Pain Management:    Induction:   PONV Risk Score and Plan:   Airway Management Planned:   Additional Equipment:   Intra-op Plan:   Post-operative Plan:   Informed Consent: I have reviewed the patients History and Physical, chart, labs and discussed the procedure including the risks, benefits and alternatives for the proposed anesthesia with the patient or authorized representative who has indicated his/her understanding and acceptance.        Plan Discussed with:   Anesthesia Plan Comments: (37.4 wk primagravida w hx of  gHtn and BMI of 41.3 for LEA)        Anesthesia Quick Evaluation

## 2022-07-30 NOTE — Anesthesia Procedure Notes (Signed)
Epidural Patient location during procedure: OB Start time: 07/30/2022 7:20 PM End time: 07/30/2022 7:45 PM  Staffing Anesthesiologist: Barnet Glasgow, MD Performed: anesthesiologist   Preanesthetic Checklist Completed: patient identified, IV checked, site marked, risks and benefits discussed, surgical consent, monitors and equipment checked, pre-op evaluation and timeout performed  Epidural Patient position: sitting Prep: DuraPrep and site prepped and draped Patient monitoring: continuous pulse ox and blood pressure Approach: midline Injection technique: LOR air  Needle:  Needle type: Tuohy  Needle gauge: 17 G Needle length: 9 cm and 9 Needle insertion depth: 8 cm Catheter type: closed end flexible Catheter size: 19 Gauge Catheter at skin depth: 14 cm Test dose: negative  Assessment Events: blood not aspirated, injection not painful, no injection resistance, no paresthesia and negative IV test  Additional Notes Patient identified. Risks/Benefits/Options discussed with patient including but not limited to bleeding, infection, nerve damage, paralysis, failed block, incomplete pain control, headache, blood pressure changes, nausea, vomiting, reactions to medication both or allergic, itching and postpartum back pain. Confirmed with bedside nurse the patient's most recent platelet count. Confirmed with patient that they are not currently taking any anticoagulation, have any bleeding history or any family history of bleeding disorders. Patient expressed understanding and wished to proceed. All questions were answered. Sterile technique was used throughout the entire procedure. Please see nursing notes for vital signs. Test dose was given through epidural needle and negative prior to continuing to dose epidural or start infusion. Warning signs of high block given to the patient including shortness of breath, tingling/numbness in hands, complete motor block, or any concerning symptoms  with instructions to call for help. Patient was given instructions on fall risk and not to get out of bed. All questions and concerns addressed with instructions to call with any issues.  3Attempt (S) . Patient tolerated procedure well.

## 2022-07-31 ENCOUNTER — Other Ambulatory Visit: Payer: Self-pay

## 2022-07-31 ENCOUNTER — Encounter (HOSPITAL_COMMUNITY): Payer: Self-pay | Admitting: Obstetrics and Gynecology

## 2022-07-31 DIAGNOSIS — O1092 Unspecified pre-existing hypertension complicating childbirth: Secondary | ICD-10-CM

## 2022-07-31 DIAGNOSIS — Z3A37 37 weeks gestation of pregnancy: Secondary | ICD-10-CM

## 2022-07-31 DIAGNOSIS — O99824 Streptococcus B carrier state complicating childbirth: Secondary | ICD-10-CM

## 2022-07-31 LAB — CBC
HCT: 29.4 % — ABNORMAL LOW (ref 36.0–46.0)
Hemoglobin: 9.8 g/dL — ABNORMAL LOW (ref 12.0–15.0)
MCH: 32.2 pg (ref 26.0–34.0)
MCHC: 33.3 g/dL (ref 30.0–36.0)
MCV: 96.7 fL (ref 80.0–100.0)
Platelets: 150 10*3/uL (ref 150–400)
RBC: 3.04 MIL/uL — ABNORMAL LOW (ref 3.87–5.11)
RDW: 14.4 % (ref 11.5–15.5)
WBC: 12 10*3/uL — ABNORMAL HIGH (ref 4.0–10.5)
nRBC: 0 % (ref 0.0–0.2)

## 2022-07-31 MED ORDER — LABETALOL HCL 200 MG PO TABS: 200.0000 mg | ORAL_TABLET | Freq: Two times a day (BID) | ORAL | Status: DC

## 2022-07-31 MED ORDER — PANTOPRAZOLE SODIUM 40 MG PO TBEC
40.0000 mg | DELAYED_RELEASE_TABLET | Freq: Every day | ORAL | Status: DC
  Administered 2022-07-31 – 2022-08-02 (×3): 40 mg via ORAL
  Filled 2022-07-31 (×4): qty 1

## 2022-07-31 MED ORDER — OXYCODONE HCL 5 MG PO TABS
5.0000 mg | ORAL_TABLET | ORAL | Status: DC | PRN
  Administered 2022-07-31 – 2022-08-02 (×10): 5 mg via ORAL
  Filled 2022-07-31 (×10): qty 1

## 2022-07-31 MED ORDER — COCONUT OIL OIL: 1.0000 | TOPICAL_OIL | Status: DC | PRN

## 2022-07-31 MED ORDER — FENTANYL CITRATE (PF) 100 MCG/2ML IJ SOLN
INTRAMUSCULAR | Status: DC | PRN
  Administered 2022-07-30: 50 ug via EPIDURAL

## 2022-07-31 MED ORDER — OXYCODONE HCL 5 MG/5ML PO SOLN: 5.0000 mg | Freq: Once | ORAL | Status: DC | PRN

## 2022-07-31 MED ORDER — ACETAMINOPHEN 500 MG PO TABS
1000.0000 mg | ORAL_TABLET | Freq: Four times a day (QID) | ORAL | Status: DC
  Administered 2022-07-31 – 2022-08-02 (×10): 1000 mg via ORAL
  Filled 2022-07-31 (×10): qty 2

## 2022-07-31 MED ORDER — SIMETHICONE 80 MG PO CHEW: 80.0000 mg | CHEWABLE_TABLET | ORAL | Status: DC | PRN

## 2022-07-31 MED ORDER — WITCH HAZEL-GLYCERIN EX PADS: 1.0000 | MEDICATED_PAD | CUTANEOUS | Status: DC | PRN

## 2022-07-31 MED ORDER — SERTRALINE HCL 50 MG PO TABS
50.0000 mg | ORAL_TABLET | Freq: Every day | ORAL | Status: DC
  Administered 2022-07-31 – 2022-08-02 (×3): 50 mg via ORAL
  Filled 2022-07-31 (×3): qty 1

## 2022-07-31 MED ORDER — OXYCODONE HCL 5 MG PO TABS: 5.0000 mg | ORAL_TABLET | Freq: Once | ORAL | Status: DC | PRN

## 2022-07-31 MED ORDER — SENNOSIDES-DOCUSATE SODIUM 8.6-50 MG PO TABS
2.0000 | ORAL_TABLET | Freq: Every day | ORAL | Status: DC
  Administered 2022-08-02: 2 via ORAL
  Filled 2022-07-31 (×2): qty 2

## 2022-07-31 MED ORDER — LABETALOL HCL 100 MG PO TABS
100.0000 mg | ORAL_TABLET | Freq: Once | ORAL | Status: AC
  Administered 2022-07-31: 100 mg via ORAL
  Filled 2022-07-31: qty 1

## 2022-07-31 MED ORDER — OXYTOCIN-SODIUM CHLORIDE 30-0.9 UT/500ML-% IV SOLN
INTRAVENOUS | Status: AC
  Filled 2022-07-31: qty 500

## 2022-07-31 MED ORDER — ZOLPIDEM TARTRATE 5 MG PO TABS: 5.0000 mg | ORAL_TABLET | Freq: Every evening | ORAL | Status: DC | PRN

## 2022-07-31 MED ORDER — SODIUM CHLORIDE 0.9 % IV SOLN
INTRAVENOUS | Status: DC | PRN
  Administered 2022-07-30: 500 mg via INTRAVENOUS

## 2022-07-31 MED ORDER — ONDANSETRON HCL 4 MG/2ML IJ SOLN: 4.0000 mg | Freq: Once | INTRAMUSCULAR | Status: DC | PRN

## 2022-07-31 MED ORDER — MENTHOL 3 MG MT LOZG: 1.0000 | LOZENGE | OROMUCOSAL | Status: DC | PRN

## 2022-07-31 MED ORDER — KETOROLAC TROMETHAMINE 30 MG/ML IJ SOLN
INTRAMUSCULAR | Status: AC
  Filled 2022-07-31: qty 1

## 2022-07-31 MED ORDER — STERILE WATER FOR IRRIGATION IR SOLN
Status: DC | PRN
  Administered 2022-07-31: 1

## 2022-07-31 MED ORDER — MEPERIDINE HCL 25 MG/ML IJ SOLN: 6.2500 mg | INTRAMUSCULAR | Status: DC | PRN

## 2022-07-31 MED ORDER — HYDROMORPHONE HCL 1 MG/ML IJ SOLN: 0.2500 mg | INTRAMUSCULAR | Status: DC | PRN

## 2022-07-31 MED ORDER — KETOROLAC TROMETHAMINE 30 MG/ML IJ SOLN
30.0000 mg | Freq: Once | INTRAMUSCULAR | Status: AC | PRN
  Administered 2022-07-31: 30 mg via INTRAVENOUS

## 2022-07-31 MED ORDER — LABETALOL HCL 200 MG PO TABS
200.0000 mg | ORAL_TABLET | Freq: Two times a day (BID) | ORAL | Status: DC
  Administered 2022-07-31 – 2022-08-02 (×4): 200 mg via ORAL
  Filled 2022-07-31 (×4): qty 1

## 2022-07-31 MED ORDER — LACTATED RINGERS IV SOLN: INTRAVENOUS | Status: DC

## 2022-07-31 MED ORDER — LABETALOL HCL 100 MG PO TABS
100.0000 mg | ORAL_TABLET | Freq: Two times a day (BID) | ORAL | Status: DC
  Administered 2022-07-31: 100 mg via ORAL
  Filled 2022-07-31: qty 1

## 2022-07-31 MED ORDER — DEXTROSE 5 % IV SOLN
INTRAVENOUS | Status: DC | PRN
  Administered 2022-07-30: 3 g via INTRAVENOUS

## 2022-07-31 MED ORDER — OXYTOCIN-SODIUM CHLORIDE 30-0.9 UT/500ML-% IV SOLN
2.5000 [IU]/h | INTRAVENOUS | Status: AC
  Administered 2022-07-31: 2.5 [IU]/h via INTRAVENOUS

## 2022-07-31 MED ORDER — TETANUS-DIPHTH-ACELL PERTUSSIS 5-2.5-18.5 LF-MCG/0.5 IM SUSY: 0.5000 mL | PREFILLED_SYRINGE | Freq: Once | INTRAMUSCULAR | Status: DC

## 2022-07-31 MED ORDER — DIBUCAINE (PERIANAL) 1 % EX OINT: 1.0000 | TOPICAL_OINTMENT | CUTANEOUS | Status: DC | PRN

## 2022-07-31 MED ORDER — OXYTOCIN-SODIUM CHLORIDE 30-0.9 UT/500ML-% IV SOLN
INTRAVENOUS | Status: DC | PRN
  Administered 2022-07-30: 30 [IU] via INTRAVENOUS

## 2022-07-31 MED ORDER — SIMETHICONE 80 MG PO CHEW
80.0000 mg | CHEWABLE_TABLET | Freq: Three times a day (TID) | ORAL | Status: DC
  Administered 2022-07-31 – 2022-08-02 (×5): 80 mg via ORAL
  Filled 2022-07-31 (×5): qty 1

## 2022-07-31 MED ORDER — DIPHENHYDRAMINE HCL 25 MG PO CAPS: 25.0000 mg | ORAL_CAPSULE | Freq: Four times a day (QID) | ORAL | Status: DC | PRN

## 2022-07-31 MED ORDER — LACTATED RINGERS IV BOLUS: 500.0000 mL | Freq: Once | INTRAVENOUS | Status: AC

## 2022-07-31 MED ORDER — IBUPROFEN 600 MG PO TABS
600.0000 mg | ORAL_TABLET | Freq: Four times a day (QID) | ORAL | Status: DC | PRN
  Administered 2022-07-31 – 2022-08-02 (×7): 600 mg via ORAL
  Filled 2022-07-31 (×7): qty 1

## 2022-07-31 MED ORDER — PRENATAL MULTIVITAMIN CH
1.0000 | ORAL_TABLET | Freq: Every day | ORAL | Status: DC
  Administered 2022-07-31 – 2022-08-02 (×3): 1 via ORAL
  Filled 2022-07-31 (×3): qty 1

## 2022-07-31 MED ORDER — HYDROMORPHONE HCL 1 MG/ML IJ SOLN: 0.2000 mg | INTRAMUSCULAR | Status: DC | PRN

## 2022-07-31 MED ORDER — LACTATED RINGERS IV BOLUS
500.0000 mL | Freq: Once | INTRAVENOUS | Status: AC
  Administered 2022-07-31: 500 mL via INTRAVENOUS

## 2022-07-31 NOTE — Progress Notes (Signed)
I was called to the OR to assist Dr. Gaetano Net with this patient's c-section.   Gerlene Fee, DO OB Fellow, [facility] 07/31/2022, 12:57 AM

## 2022-07-31 NOTE — Anesthesia Postprocedure Evaluation (Signed)
Anesthesia Post Note  Patient: Cynthia Robertson  Procedure(s) Performed: CESAREAN SECTION (Abdomen)     Patient location during evaluation: Mother Baby Anesthesia Type: Epidural Level of consciousness: oriented and awake and alert Pain management: pain level controlled Vital Signs Assessment: post-procedure vital signs reviewed and stable Respiratory status: spontaneous breathing and respiratory function stable Cardiovascular status: blood pressure returned to baseline and stable Postop Assessment: no headache, no backache, no apparent nausea or vomiting and able to ambulate Anesthetic complications: no   No notable events documented.  Last Vitals:  Vitals:   07/31/22 0228 07/31/22 0340  BP: (!) 144/70 138/65  Pulse: 80 72  Resp:    Temp: 36.8 C 36.8 C  SpO2: 100% 97%    Last Pain:  Vitals:   07/31/22 0340  TempSrc: Oral  PainSc: 2    Pain Goal:                Epidural/Spinal Function Cutaneous sensation: Able to Wiggle Toes (07/31/22 0340), Patient able to flex knees: Yes (07/31/22 0340), Patient able to lift hips off bed: Yes (07/31/22 0340), Back pain beyond tenderness at insertion site: No (07/31/22 0340), Progressively worsening motor and/or sensory loss: No (07/31/22 0340), Bowel and/or bladder incontinence post epidural: No (07/31/22 0340)  Barnet Glasgow

## 2022-07-31 NOTE — Brief Op Note (Signed)
07/30/2022  12:24 AM  PATIENT:  Cynthia Robertson  31 y.o. female  PRE-OPERATIVE DIAGNOSIS:  Non reassuring fetal heart beat  POST-OPERATIVE DIAGNOSIS:  Non reassuring fetal heart beat  PROCEDURE:  Procedure(s): CESAREAN SECTION (N/A)  SURGEON:  Surgeon(s) and Role:    * Everlene Farrier, MD - Primary    * Autry-Lott, Naaman Plummer, DO - Fellow  PHYSICIAN ASSISTANT:   ASSISTANTS: none   ANESTHESIA:   epidural  EBL:  500 ml   BLOOD ADMINISTERED:none  DRAINS: Urinary Catheter (Foley)   LOCAL MEDICATIONS USED:  NONE  SPECIMEN:  Source of Specimen:  placenta  DISPOSITION OF SPECIMEN:  PATHOLOGY  COUNTS:  YES  TOURNIQUET:  * No tourniquets in log *  DICTATION: .Other Dictation: Dictation Number [id number]  PLAN OF CARE: Admit to inpatient   PATIENT DISPOSITION:  PACU - hemodynamically stable.   Delay start of Pharmacological VTE agent (>24hrs) due to surgical blood loss or risk of bleeding: not applicable

## 2022-07-31 NOTE — Progress Notes (Signed)
MOB was referred for history of depression/anxiety. * Referral screened out by Clinical Social Worker because none of the following criteria appear to apply: ~ History of anxiety/depression during this pregnancy, or of post-partum depression following prior delivery. ~ Diagnosis of anxiety and/or depression within last 3 years OR * MOB's symptoms currently being treated with medication and/or therapy. MOB has an active prescription for sertraline '50mg'$ . Per MOB's [place] records, no mental health concerns during pregnancy.   Please contact the Clinical Social Worker if needs arise, by [facility] request, or if MOB scores greater than 9/yes to question 10 on [place] Postpartum Depression Screen.  Signed,  Berniece Salines, MSW, LCSWA, LCASA 07/31/2022 10:27 AM

## 2022-07-31 NOTE — Transfer of Care (Signed)
Immediate Anesthesia Transfer of Care Note  Patient: Cynthia Robertson  Procedure(s) Performed: CESAREAN SECTION (Abdomen)  Patient Location: PACU  Anesthesia Type:Epidural  Level of Consciousness: awake  Airway & Oxygen Therapy: Patient Spontanous Breathing  Post-op Assessment: Report given to RN and Post -op Vital signs reviewed and stable  Post vital signs: Reviewed and stable  Last Vitals:  Vitals Value Taken Time  BP    Temp    Pulse 86 07/31/22 0049  Resp 23 07/31/22 0049  SpO2 99 % 07/31/22 0049  Vitals shown include unvalidated device data.  Last Pain:  Vitals:   07/30/22 2230  TempSrc: Oral  PainSc:          Complications: No notable events documented.

## 2022-07-31 NOTE — Progress Notes (Addendum)
Subjective: Postpartum Day 1: Cesarean Delivery Patient reports incisional pain and tolerating PO.    Objective: Vital signs in last 24 hours: Temp:  [97.6 F (36.4 C)-98.5 F (36.9 C)] 98 F (36.7 C) (10/21 2330) Pulse Rate:  [54-95] 80 (10/21 0608) Resp:  [16-28] 18 (10/21 0762) BP: (98-155)/(52-83) 144/68 (10/21 0505) SpO2:  [97 %-100 %] 100 % (10/21 2633)  Physical Exam:  General: alert, cooperative, and no distress Lochia: appropriate Uterine Fundus: firm Incision: healing well DVT Evaluation: No evidence of DVT seen on physical exam.  Recent Labs    07/30/22 1821 07/31/22 0551  HGB 11.3* 9.8*  HCT 33.3* 29.4*    Assessment/Plan: Status post Cesarean section. Doing well postoperatively.  Continue current care. Restarted on labetalol '100mg'$  BID-first dose about 2-3 hours ago. Some BPs mildly elevated. Will give another '100mg'$  labetalol po now and then continue '200mg'$  q12 hours.  Urine is concentrated. Will give 568m IV fluid bolus D/W circumcision of newborn boy baby. Risks reviewed. She states she understands and agrees. JShon MilletII, MD 07/31/2022, 7:04 AM

## 2022-07-31 NOTE — Lactation Note (Signed)
This note was copied from a baby's chart. Lactation Consultation Note  Patient Name: Cynthia Robertson Vitalia Stough JSEGB'T Date: 07/31/2022   Age:32 hours  Mother states she has now chosen to formula feed.    Feeding Nipple Type: Slow - flow       Carlye Grippe  RN IBCLC 07/31/2022, 9:43 AM

## 2022-07-31 NOTE — Op Note (Unsigned)
NAME: Cynthia Robertson, Cynthia A. MEDICAL RECORD NO: [id number] ACCOUNT NO: [id number] DATE OF BIRTH: 04-22-1991 FACILITY: [facility] LOCATION: [id number] PHYSICIAN: Daleen Bo. Lyn Hollingshead, MD  Operative Report   DATE OF PROCEDURE: 07/31/2022  PREOPERATIVE DIAGNOSIS:  Non-reassuring fetal heart tracing.  POSTOPERATIVE DIAGNOSIS:  Non-reassuring fetal heart tracing.  PROCEDURE:  Low transverse cesarean section.  SURGEON:  Daleen Bo. Lyn Hollingshead, MD  ASSISTANT:  Simone Autry-Lott, DO  ANESTHESIA:  Epidural.  ESTIMATED BLOOD LOSS:  500 mL.  FINDINGS:  Viable female infant.  Apgars per neonatology. Birth weight 7 pounds 11 ounces.  Arterial cord pH 7.07.  INDICATIONS AND CONSENT:  This patient is a 31 year old G1 P0 at 37-5/7 weeks who was admitted for induction of labor for gestational hypertension.  She received misoprostol.  She then has an epidural placed prior to starting Pitocin.  Pitocin has been  started and the baby has repetitive late decelerations and then a 2-minute deceleration to the 70s.  The Pitocin was discontinued.  There was good recovery and cesarean section was recommended.  Potential risks and complications were discussed with the  patient preoperatively including but not limited to infection, organ damage, bleeding requiring transfusion of blood products with HIV and hepatitis acquisition, DVT, PE, pneumonia and wound breakdown.  The patient states she understands and agrees and  consent is signed on the chart.  DESCRIPTION OF PROCEDURE:  The patient was taken to the operating room where a surgical level of anesthesia was induced by anesthesiology.  She was then placed in the dorsal supine position with a 15-degree left lateral wedge.  She is prepped vaginally  with Betadine.  Foley catheter was then placed and she was prepped abdominally with ChloraPrep.  Timeout was done.  After 3-minute drying time, she was draped in sterile fashion.  After testing for adequate epidural anesthesia, skin was  entered through a  Pfannenstiel incision and dissection was carried out in layers to the peritoneum, which was taken down superiorly and inferiorly.  Vesicouterine peritoneum was taken down cephalad laterally.  Bladder flap developed and the bladder blade was placed.   Uterus was incised in a low transverse manner and the uterine cavity was entered bluntly with a hemostat.  The uterine incision was extended with the fingers.  Artificial rupture of membranes for clear fluid was done.  Upon entry into the uterus, a loop  of cord immediately presents.  Baby was delivered from the vertex position and a nuchal cord was noted, which was reduced.  Baby was then completely delivered.  Good cry and tone was noted.  After 1 minute, cord was clamped and cut and the baby was  handed to waiting pediatrics team.  Placenta was delivered and sent to pathology.  Uterine cavity was clean.  Uterus was closed in 2 running locking imbricating layers of 0 Monocryl suture, which achieved good hemostasis.  Tubes and ovaries were normal  bilaterally.  Lavage was carried out.  Anterior peritoneum was closed in running fashion with 0 Monocryl suture, which was also used to reapproximate the pyramidalis muscle in the midline.  Anterior rectus fascia was closed in running fashion with a 0  looped PDS.  Subcutaneous layer was closed with interrupted 2-0 plain and the skin was closed with a subcuticular 4-0 Vicryl on a Keith needle.  Benzoin, Steri-Strips, honeycomb and pressure dressing were applied.  All counts were correct and the patient  was taken to recovery room in stable condition.   NIK D: 07/31/2022 12:32:34 am T: 07/31/2022  1:27:00 am  JOB: [id number]

## 2022-08-01 NOTE — Progress Notes (Signed)
Subjective: Postpartum Day 2: Cesarean Delivery Patient reports tolerating PO, + flatus, + BM, and no problems voiding.    Objective: Vital signs in last 24 hours: Temp:  [98 F (36.7 C)-98.1 F (36.7 C)] 98.1 F (36.7 C) (10/22 0536) Pulse Rate:  [60-88] 77 (10/22 0536) Resp:  [16-18] 18 (10/22 0536) BP: (131-145)/(57-88) 131/75 (10/22 0536) SpO2:  [97 %-99 %] 97 % (10/22 0536)  Physical Exam:  General: alert, cooperative, and no distress Lochia: appropriate Uterine Fundus: firm Incision: healing well DVT Evaluation: No evidence of DVT seen on physical exam.  Recent Labs    07/30/22 1821 07/31/22 0551  HGB 11.3* 9.8*  HCT 33.3* 29.4*    Assessment/Plan: Status post Cesarean section. Doing well postoperatively.  Continue current care.  Shon Millet II, MD 08/01/2022, 7:08 AM

## 2022-08-02 MED ORDER — OXYCODONE HCL 5 MG PO TABS: 5.0000 mg | ORAL_TABLET | ORAL | 0 refills | Status: DC | PRN

## 2022-08-02 MED ORDER — LABETALOL HCL 200 MG PO TABS: 200.0000 mg | ORAL_TABLET | Freq: Two times a day (BID) | ORAL | 0 refills | Status: DC

## 2022-08-02 MED ORDER — IBUPROFEN 600 MG PO TABS: 600.0000 mg | ORAL_TABLET | Freq: Four times a day (QID) | ORAL | 0 refills | Status: AC | PRN

## 2022-08-02 MED ORDER — ACETAMINOPHEN 325 MG PO TABS: 650.0000 mg | ORAL_TABLET | Freq: Four times a day (QID) | ORAL | 0 refills | Status: AC | PRN

## 2022-08-02 NOTE — Progress Notes (Signed)
Postpartum Progress Note  Postpartum Day 3 s/p primary Cesarean section.  Subjective:  Patient reports no overnight events.  She reports well controlled pain, ambulating without difficulty, voiding spontaneously, tolerating PO.  She reports Positive flatus, Positive BM.  Vaginal bleeding is appropriate.  Objective: Blood pressure (!) 141/75, pulse 69, temperature 98.1 F (36.7 C), temperature source Oral, resp. rate 20, height '5\' 8"'$  (1.727 m), weight 123.2 kg, last menstrual period 11/09/2021, SpO2 97 %.  Physical Exam:  General: alert and no distress Lochia: appropriate Uterine Fundus: firm Incision: dressing in place DVT Evaluation: No evidence of DVT seen on physical exam.  Recent Labs    07/30/22 1821 07/31/22 0551  HGB 11.3* 9.8*  HCT 33.3* 29.4*    Assessment/Plan: Postpartum Day 3, s/p C-section gHTN vs cHTN - continue labetalol 200 mg BID BP in office this week Acute blood loss anemia - Fe/Colace. No signs or symptoms of anemia.  Lactation following Doing well, continue routine postpartum care. Anticipate discharge home today.  Plan for 1 week BP check. Discussed incision care and removal of honeycomb PPD#5 and steri strips PPD#7.   LOS: 3 days   Carlyon Shadow 08/02/2022, 7:44 AM

## 2022-08-02 NOTE — Progress Notes (Signed)
Pt non-immune to [place]. Pt offered MMR vaccine, but refused. Pt said that she would receive the vaccine at her primary care provider.   Vito Backers, RN

## 2022-08-03 LAB — SURGICAL PATHOLOGY

## 2022-08-03 NOTE — Discharge Summary (Signed)
Obstetric Discharge Summary  Cynthia Robertson is a 31 y.o. female that presented on 07/30/2022 for IOL for hypertension affecting pregnancy.  Her labor course was complicated by nonreassuring fetal heart tracing and she delivered a viable female infant on 07/31/2022.  Her postpartum course was uncomplicated and on PPD#2, she reported well controlled pain, spontaneous voiding, ambulating without difficulty, and tolerating PO.  She was stable for discharge home on 08/02/2022 with plans for in-office follow up.  Hemoglobin  Date Value Ref Range Status  07/31/2022 9.8 (L) 12.0 - 15.0 g/dL Final   Hemoglobin, fingerstick  Date Value Ref Range Status  10/17/2015 12.6 12.0 - 16.0 g/dL Final   HCT  Date Value Ref Range Status  07/31/2022 29.4 (L) 36.0 - 46.0 % Final    Physical Exam:  General: alert and no distress Lochia: appropriate Uterine Fundus: firm Incision: healing well DVT Evaluation: No evidence of DVT seen on physical exam.  Discharge Diagnoses:  gHTN , s/p C section  Discharge Information: Date: 08/03/2022 Activity: Pelvic rest, as tolerated Diet: regular Medications: Tylenol, motrin, oxycodone Condition: stable Instructions: Refer to practice specific booklet.  Discussed prior to discharge.  Discharge to: [facility] Follow up.   Why: Please follow up for a 1 week blood pressure check and a 6 week postpartum visit. Contact information: [place]  [phone]                 Newborn Data: Live born female  Birth Weight: 7 lb 11.1 oz (3490 g) APGAR: 8, 9  Newborn Delivery   Birth date/time: 07/30/2022 23:57:00 Delivery type: C-Section, Low Transverse Trial of labor: Yes C-section categorization: Primary     Discussed incision care and removal of honeycomb PPD#5 and steri strips PPD#7.   Home with mother.  Carlyon Shadow 08/03/2022, 1:08 PM

## 2022-08-04 ENCOUNTER — Inpatient Hospital Stay (HOSPITAL_COMMUNITY)
Admission: AD | Admit: 2022-08-04 | Discharge: 2022-08-08 | DRG: 776 | Disposition: A | Discharge status: Home / Self Care | Payer: 59 | Attending: Obstetrics and Gynecology | Admitting: Obstetrics and Gynecology

## 2022-08-04 ENCOUNTER — Encounter (HOSPITAL_COMMUNITY): Payer: Self-pay | Admitting: *Deleted

## 2022-08-04 ENCOUNTER — Other Ambulatory Visit: Payer: Self-pay

## 2022-08-04 DIAGNOSIS — O1415 Severe pre-eclampsia, complicating the puerperium: Principal | ICD-10-CM | POA: Diagnosis present

## 2022-08-04 DIAGNOSIS — O135 Gestational [pregnancy-induced] hypertension without significant proteinuria, complicating the puerperium: Secondary | ICD-10-CM | POA: Diagnosis not present

## 2022-08-04 DIAGNOSIS — O1410 Severe pre-eclampsia, unspecified trimester: Secondary | ICD-10-CM | POA: Diagnosis present

## 2022-08-04 LAB — CBC
HCT: 30.6 % — ABNORMAL LOW (ref 36.0–46.0)
Hemoglobin: 9.7 g/dL — ABNORMAL LOW (ref 12.0–15.0)
MCH: 33 pg (ref 26.0–34.0)
MCHC: 31.7 g/dL (ref 30.0–36.0)
MCV: 104.1 fL — ABNORMAL HIGH (ref 80.0–100.0)
Platelets: 207 10*3/uL (ref 150–400)
RBC: 2.94 MIL/uL — ABNORMAL LOW (ref 3.87–5.11)
RDW: 14.6 % (ref 11.5–15.5)
WBC: 8.3 10*3/uL (ref 4.0–10.5)
nRBC: 0 % (ref 0.0–0.2)

## 2022-08-04 LAB — COMPREHENSIVE METABOLIC PANEL
ALT: 23 U/L (ref 0–44)
AST: 33 U/L (ref 15–41)
Albumin: 2.6 g/dL — ABNORMAL LOW (ref 3.5–5.0)
Alkaline Phosphatase: 102 U/L (ref 38–126)
Anion gap: 10 (ref 5–15)
BUN: 7 mg/dL (ref 6–20)
CO2: 21 mmol/L — ABNORMAL LOW (ref 22–32)
Calcium: 8.7 mg/dL — ABNORMAL LOW (ref 8.9–10.3)
Chloride: 112 mmol/L — ABNORMAL HIGH (ref 98–111)
Creatinine, Ser: 0.83 mg/dL (ref 0.44–1.00)
GFR, Estimated: >60 mL/min (ref >60)
Glucose, Bld: 79 mg/dL (ref 70–99)
Potassium: 3.3 mmol/L — ABNORMAL LOW (ref 3.5–5.1)
Sodium: 143 mmol/L (ref 135–145)
Total Bilirubin: 0.6 mg/dL (ref 0.3–1.2)
Total Protein: 5.6 g/dL — ABNORMAL LOW (ref 6.5–8.1)

## 2022-08-04 MED ORDER — HYDRALAZINE HCL 20 MG/ML IJ SOLN
10.0000 mg | INTRAMUSCULAR | Status: DC | PRN
  Filled 2022-08-04: qty 1

## 2022-08-04 MED ORDER — SENNOSIDES-DOCUSATE SODIUM 8.6-50 MG PO TABS: 2.0000 | ORAL_TABLET | Freq: Every day | ORAL | Status: DC

## 2022-08-04 MED ORDER — MEASLES, MUMPS & RUBELLA VAC IJ SOLR
0.5000 mL | Freq: Once | INTRAMUSCULAR | Status: DC
  Filled 2022-08-04: qty 0.5

## 2022-08-04 MED ORDER — DIBUCAINE (PERIANAL) 1 % EX OINT: 1.0000 | TOPICAL_OINTMENT | CUTANEOUS | Status: DC | PRN

## 2022-08-04 MED ORDER — LABETALOL HCL 5 MG/ML IV SOLN
20.0000 mg | INTRAVENOUS | Status: DC | PRN
  Administered 2022-08-06 (×2): 20 mg via INTRAVENOUS

## 2022-08-04 MED ORDER — LABETALOL HCL 5 MG/ML IV SOLN
40.0000 mg | INTRAVENOUS | Status: DC | PRN
  Filled 2022-08-04: qty 8

## 2022-08-04 MED ORDER — MAGNESIUM SULFATE 40 GM/1000ML IV SOLN
2.0000 g/h | INTRAVENOUS | Status: AC
  Administered 2022-08-04 – 2022-08-05 (×2): 2 g/h via INTRAVENOUS
  Filled 2022-08-04 (×2): qty 1000

## 2022-08-04 MED ORDER — COCONUT OIL OIL: 1.0000 | TOPICAL_OIL | Status: DC | PRN

## 2022-08-04 MED ORDER — SIMETHICONE 80 MG PO CHEW
80.0000 mg | CHEWABLE_TABLET | Freq: Three times a day (TID) | ORAL | Status: DC
  Filled 2022-08-04: qty 1

## 2022-08-04 MED ORDER — LABETALOL HCL 5 MG/ML IV SOLN
20.0000 mg | INTRAVENOUS | Status: DC | PRN
  Administered 2022-08-04 – 2022-08-06 (×2): 20 mg via INTRAVENOUS
  Filled 2022-08-04 (×5): qty 4

## 2022-08-04 MED ORDER — ACETAMINOPHEN 500 MG PO TABS
1000.0000 mg | ORAL_TABLET | Freq: Four times a day (QID) | ORAL | Status: DC
  Administered 2022-08-04 – 2022-08-06 (×8): 1000 mg via ORAL
  Filled 2022-08-04 (×10): qty 2

## 2022-08-04 MED ORDER — WITCH HAZEL-GLYCERIN EX PADS: 1.0000 | MEDICATED_PAD | CUTANEOUS | Status: DC | PRN

## 2022-08-04 MED ORDER — TETANUS-DIPHTH-ACELL PERTUSSIS 5-2.5-18.5 LF-MCG/0.5 IM SUSY: 0.5000 mL | PREFILLED_SYRINGE | Freq: Once | INTRAMUSCULAR | Status: DC

## 2022-08-04 MED ORDER — LABETALOL HCL 5 MG/ML IV SOLN: 80.0000 mg | INTRAVENOUS | Status: DC | PRN

## 2022-08-04 MED ORDER — HYDRALAZINE HCL 20 MG/ML IJ SOLN
10.0000 mg | INTRAMUSCULAR | Status: DC | PRN
  Administered 2022-08-04: 10 mg via INTRAVENOUS

## 2022-08-04 MED ORDER — LACTATED RINGERS IV SOLN: INTRAVENOUS | Status: AC

## 2022-08-04 MED ORDER — SIMETHICONE 80 MG PO CHEW: 80.0000 mg | CHEWABLE_TABLET | ORAL | Status: DC | PRN

## 2022-08-04 MED ORDER — SERTRALINE HCL 50 MG PO TABS
50.0000 mg | ORAL_TABLET | Freq: Every day | ORAL | Status: DC
  Administered 2022-08-04 – 2022-08-05 (×2): 50 mg via ORAL
  Filled 2022-08-04 (×3): qty 1

## 2022-08-04 MED ORDER — LABETALOL HCL 5 MG/ML IV SOLN
40.0000 mg | INTRAVENOUS | Status: DC | PRN
  Administered 2022-08-04 – 2022-08-07 (×2): 40 mg via INTRAVENOUS
  Filled 2022-08-04: qty 8

## 2022-08-04 MED ORDER — LABETALOL HCL 200 MG PO TABS
400.0000 mg | ORAL_TABLET | Freq: Two times a day (BID) | ORAL | Status: DC
  Administered 2022-08-04 – 2022-08-05 (×3): 400 mg via ORAL
  Filled 2022-08-04: qty 4
  Filled 2022-08-04 (×2): qty 2

## 2022-08-04 MED ORDER — OXYCODONE HCL 5 MG PO TABS: 5.0000 mg | ORAL_TABLET | ORAL | Status: DC | PRN

## 2022-08-04 MED ORDER — ZOLPIDEM TARTRATE 5 MG PO TABS: 5.0000 mg | ORAL_TABLET | Freq: Every evening | ORAL | Status: DC | PRN

## 2022-08-04 MED ORDER — IBUPROFEN 600 MG PO TABS
600.0000 mg | ORAL_TABLET | Freq: Four times a day (QID) | ORAL | Status: AC
  Administered 2022-08-04 – 2022-08-06 (×7): 600 mg via ORAL
  Filled 2022-08-04 (×9): qty 1

## 2022-08-04 MED ORDER — MAGNESIUM SULFATE BOLUS VIA INFUSION
4.0000 g | Freq: Once | INTRAVENOUS | Status: AC
  Administered 2022-08-04: 4 g via INTRAVENOUS
  Filled 2022-08-04: qty 1000

## 2022-08-04 MED ORDER — PRENATAL MULTIVITAMIN CH
1.0000 | ORAL_TABLET | Freq: Every day | ORAL | Status: DC
  Administered 2022-08-05 – 2022-08-08 (×4): 1 via ORAL
  Filled 2022-08-04 (×4): qty 1

## 2022-08-04 MED ORDER — MENTHOL 3 MG MT LOZG: 1.0000 | LOZENGE | OROMUCOSAL | Status: DC | PRN

## 2022-08-04 MED ORDER — DIPHENHYDRAMINE HCL 25 MG PO CAPS: 25.0000 mg | ORAL_CAPSULE | Freq: Four times a day (QID) | ORAL | Status: DC | PRN

## 2022-08-04 NOTE — MAU Note (Signed)
Cynthia Robertson is a 31 y.o. at Unknown here in MAU reporting: she was sent from [facility] office for BP evaluation.  Reports current H/A, has been intermittent for few days.  Denies epigastric pain & visual disturbances.  States had GHTN with the pregnancy.  Currently on Labetalol '200mg'$  twice per day, last taken @ 0800 this morning.  Took Tylenol @ 0500.  S/P Cesarean 07/30/2022  Onset of complaint: today Pain score: 1 Vitals:   08/04/22 1121  BP: (!) 171/79  Pulse: (!) 54  Resp: 18  Temp: 98.3 F (36.8 C)  SpO2: 97%     FHT:NA Lab orders placed from triage:   None

## 2022-08-04 NOTE — MAU Provider Note (Signed)
History     CSN: [id number]  Arrival date and time: 08/04/22 1056   Event Date/Time   First Provider Initiated Contact with Patient 08/04/22 1138      Chief Complaint  Patient presents with   BP Evaluation   31 y.o. G1P1 s/p CS 5 days ago presenting from office for severe blood pressures today. Reports mild HA 2/10. Denies visual disturbances, RUQ pain, SOB, and CP. Her pregnancy was complicated by gHTN and she has been taking Labetalol 200 bid, had a dose today.      OB History     Gravida  1   Para  1   Term  1   Preterm      AB      Living  1      SAB      IAB      Ectopic      Multiple      Live Births  1           Past Medical History:  Diagnosis Date   Anxiety    Depression    Fibroid    GERD (gastroesophageal reflux disease)    History of chicken pox    Pregnancy induced hypertension     Past Surgical History:  Procedure Laterality Date   CESAREAN SECTION N/A 07/30/2022   Procedure: CESAREAN SECTION;  Surgeon: Everlene Farrier, MD;  Location: [facility] LD ORS;  Service: Obstetrics;  Laterality: N/A;   NECK SURGERY     WISDOM TOOTH EXTRACTION  04/10/2012    Family History  Problem Relation Age of Onset   Hyperlipidemia Mother    Stroke Maternal Grandfather    Depression Father    Alcohol abuse Father    Suicidality Father     Social History   Tobacco Use   Smoking status: Never   Smokeless tobacco: Never  Vaping Use   Vaping Use: Never used  Substance Use Topics   Alcohol use: Not Currently    Alcohol/week: 5.0 - 10.0 standard drinks of alcohol    Types: 5 - 10 Standard drinks or equivalent per week   Drug use: No    Allergies: No Known Allergies  Medications Prior to Admission  Medication Sig Dispense Refill Last Dose   labetalol (NORMODYNE) 200 MG tablet Take 1 tablet (200 mg total) by mouth 2 (two) times daily. 60 tablet 0 08/04/2022 at 0800   omeprazole (PRILOSEC) 20 MG capsule Take 20 mg by mouth daily.   Past Week    Prenatal Vit-Fe Fumarate-FA (PRENATAL VITAMINS PO) Take by mouth.   Past Week   sertraline (ZOLOFT) 50 MG tablet Take 1 tablet (50 mg total) by mouth daily. 90 tablet 1 Past Week   acetaminophen (TYLENOL) 325 MG tablet Take 2 tablets (650 mg total) by mouth every 6 (six) hours as needed. 30 tablet 0 08/04/22 at 1700   ferrous sulfate 325 (65 FE) MG tablet Take 325 mg by mouth daily with breakfast.      ibuprofen (ADVIL) 600 MG tablet Take 1 tablet (600 mg total) by mouth every 6 (six) hours as needed for moderate pain. 30 tablet 0    oxyCODONE (OXY IR/ROXICODONE) 5 MG immediate release tablet Take 1 tablet (5 mg total) by mouth every 4 (four) hours as needed for up to 5 days for moderate pain. 12 tablet 0     Review of Systems  Eyes:  Negative for visual disturbance.  Respiratory:  Negative for shortness of breath.  Cardiovascular:  Negative for chest pain.  Neurological:  Positive for headaches.   Physical Exam   Blood pressure (!) 163/69, pulse (!) 52, temperature 98.1 F (36.7 C), temperature source Oral, resp. rate 18, height '5\' 8"'$  (1.727 m), weight 120.2 kg, SpO2 100 %, unknown if currently breastfeeding. Patient Vitals for the past 24 hrs:  BP Temp Temp src Pulse Resp SpO2 Height Weight  08/04/22 1350 -- 98.1 F (36.7 C) Oral (!) 52 18 100 % -- --  08/04/22 1346 (!) 163/69 -- -- (!) 52 -- -- -- --  08/04/22 1331 (!) 165/77 -- -- (!) 59 -- -- -- --  08/04/22 1316 (!) 157/74 -- -- 60 -- -- -- --  08/04/22 1300 (!) 156/73 -- -- (!) 53 -- -- -- --  08/04/22 1245 (!) 157/80 -- -- (!) 59 -- -- -- --  08/04/22 1223 (!) 151/72 -- -- (!) 52 -- -- -- --  08/04/22 1205 (!) 160/74 -- -- (!) 56 -- -- -- --  08/04/22 1135 (!) 167/76 -- -- (!) 51 -- -- -- --  08/04/22 1121 (!) 171/79 98.3 F (36.8 C) Oral (!) 54 18 97 % -- --  08/04/22 1114 -- -- -- -- -- -- '5\' 8"'$  (1.727 m) 120.2 kg    Physical Exam Vitals and nursing note reviewed.  Constitutional:      General: She is in acute  distress (tearful).     Appearance: Normal appearance.  HENT:     Head: Normocephalic and atraumatic.  Pulmonary:     Effort: Pulmonary effort is normal. No respiratory distress.  Musculoskeletal:        General: Swelling (trace BLE) present.     Cervical back: Normal range of motion.  Skin:    General: Skin is warm and dry.  Neurological:     General: No focal deficit present.     Mental Status: She is alert and oriented to person, place, and time.  Psychiatric:        Mood and Affect: Mood normal.        Behavior: Behavior normal.    Results for orders placed or performed during the hospital encounter of 08/04/22 (from the past 24 hour(s))  CBC     Status: Abnormal   Collection Time: 08/04/22 11:51 AM  Result Value Ref Range   WBC 8.3 4.0 - 10.5 K/uL   RBC 2.94 (L) 3.87 - 5.11 MIL/uL   Hemoglobin 9.7 (L) 12.0 - 15.0 g/dL   HCT 30.6 (L) 36.0 - 46.0 %   MCV 104.1 (H) 80.0 - 100.0 fL   MCH 33.0 26.0 - 34.0 pg   MCHC 31.7 30.0 - 36.0 g/dL   RDW 14.6 11.5 - 15.5 %   Platelets 207 150 - 400 K/uL   nRBC 0.0 0.0 - 0.2 %  Comprehensive metabolic panel     Status: Abnormal   Collection Time: 08/04/22 12:52 PM  Result Value Ref Range   Sodium 143 135 - 145 mmol/L   Potassium 3.3 (L) 3.5 - 5.1 mmol/L   Chloride 112 (H) 98 - 111 mmol/L   CO2 21 (L) 22 - 32 mmol/L   Glucose, Bld 79 70 - 99 mg/dL   BUN 7 6 - 20 mg/dL   Creatinine, Ser 0.83 0.44 - 1.00 mg/dL   Calcium 8.7 (L) 8.9 - 10.3 mg/dL   Total Protein 5.6 (L) 6.5 - 8.1 g/dL   Albumin 2.6 (L) 3.5 - 5.0 g/dL   AST 33  15 - 41 U/L   ALT 23 0 - 44 U/L   Alkaline Phosphatase 102 38 - 126 U/L   Total Bilirubin 0.6 0.3 - 1.2 mg/dL   GFR, Estimated >60 >60 mL/min   Anion gap 10 5 - 15    MAU Course  Procedures Labetalol IV  MDM Labs ordered and reviewed.  1320: Dr. Royston Sinner notified of presentation, clinical findings, and need for BP protocol. Plan for admit.   Assessment and Plan   1. Severe pre-eclampsia, postpartum     Admit to [facility] per Dr. Delmer Islam, CNM 08/04/2022, 2:03 PM

## 2022-08-04 NOTE — H&P (Signed)
Cynthia Robertson is an 31 y.o. female. POD#5 (pLTCS nrFHT 07/30/22) presenting with PIH with severe features. She was discharged postop with good BP control on Labetalol '200mg'$  BID. However, yesterday she ntoed Bps at home 140-160s/90-100s. This AM she had a mild HA 2/10. She came to office for BP check. BP was 160-180/80s. In the MAU she has had a few persistent severe range Bps that were responsive to IV Labetalol 20/40.  She reports HA is barely there and denies vision changes, SOB, CP, or abdominal pain.   OB HX: G1P1 pLTCS nrFHT at 2cm 07/30/22.  Past Medical History:  Diagnosis Date   Anxiety    Depression    Fibroid    GERD (gastroesophageal reflux disease)    History of chicken pox    Pregnancy induced hypertension     Past Surgical History:  Procedure Laterality Date   CESAREAN SECTION N/A 07/30/2022   Procedure: CESAREAN SECTION;  Surgeon: Everlene Farrier, MD;  Location: [facility] LD ORS;  Service: Obstetrics;  Laterality: N/A;   NECK SURGERY     WISDOM TOOTH EXTRACTION  04/10/2012    Family History  Problem Relation Age of Onset   Hyperlipidemia Mother    Stroke Maternal Grandfather    Depression Father    Alcohol abuse Father    Suicidality Father     Social History:  reports that she has never smoked. She has never used smokeless tobacco. She reports that she does not currently use alcohol after a past usage of about 5.0 - 10.0 standard drinks of alcohol per week. She reports that she does not use drugs.  Allergies: No Known Allergies  Medications Prior to Admission  Medication Sig Dispense Refill Last Dose   labetalol (NORMODYNE) 200 MG tablet Take 1 tablet (200 mg total) by mouth 2 (two) times daily. 60 tablet 0 08/04/2022 at 0800   omeprazole (PRILOSEC) 20 MG capsule Take 20 mg by mouth daily.   Past Week   Prenatal Vit-Fe Fumarate-FA (PRENATAL VITAMINS PO) Take by mouth.   Past Week   sertraline (ZOLOFT) 50 MG tablet Take 1 tablet (50 mg total) by mouth daily. 90  tablet 1 Past Week   acetaminophen (TYLENOL) 325 MG tablet Take 2 tablets (650 mg total) by mouth every 6 (six) hours as needed. 30 tablet 0 08/04/22 at 1700   ferrous sulfate 325 (65 FE) MG tablet Take 325 mg by mouth daily with breakfast.      ibuprofen (ADVIL) 600 MG tablet Take 1 tablet (600 mg total) by mouth every 6 (six) hours as needed for moderate pain. 30 tablet 0    oxyCODONE (OXY IR/ROXICODONE) 5 MG immediate release tablet Take 1 tablet (5 mg total) by mouth every 4 (four) hours as needed for up to 5 days for moderate pain. 12 tablet 0     Review of Systems  Blood pressure (!) 157/74, pulse 60, temperature 98.3 F (36.8 C), temperature source Oral, resp. rate 18, height '5\' 8"'$  (1.727 m), weight 120.2 kg, SpO2 97 %, unknown if currently breastfeeding. Physical Exam  NAD, A&O NWOB Abd soft, nondistended, incision c/d/I, appropriately tender Selling trace b/l   Results for orders placed or performed during the hospital encounter of 08/04/22 (from the past 24 hour(s))  CBC     Status: Abnormal   Collection Time: 08/04/22 11:51 AM  Result Value Ref Range   WBC 8.3 4.0 - 10.5 K/uL   RBC 2.94 (L) 3.87 - 5.11 MIL/uL   Hemoglobin 9.7 (  L) 12.0 - 15.0 g/dL   HCT 30.6 (L) 36.0 - 46.0 %   MCV 104.1 (H) 80.0 - 100.0 fL   MCH 33.0 26.0 - 34.0 pg   MCHC 31.7 30.0 - 36.0 g/dL   RDW 14.6 11.5 - 15.5 %   Platelets 207 150 - 400 K/uL   nRBC 0.0 0.0 - 0.2 %    No results found.  Assessment/Plan: 31 yo with severe PIH pp POD#5 s/p CS.Patient counseled regarding this and risks.   - S/p IV Labetalol 20/40 in MAU - Increase Labetalol to '400mg'$  BID - Magnesium for seizure proph - PIH labs daily - Continue other home meds  Tyson Dense 08/04/2022, 1:21 PM

## 2022-08-05 DIAGNOSIS — O1415 Severe pre-eclampsia, complicating the puerperium: Secondary | ICD-10-CM | POA: Diagnosis present

## 2022-08-05 LAB — CBC
HCT: 28.3 % — ABNORMAL LOW (ref 36.0–46.0)
Hemoglobin: 9.2 g/dL — ABNORMAL LOW (ref 12.0–15.0)
MCH: 32.1 pg (ref 26.0–34.0)
MCHC: 32.5 g/dL (ref 30.0–36.0)
MCV: 98.6 fL (ref 80.0–100.0)
Platelets: 193 10*3/uL (ref 150–400)
RBC: 2.87 MIL/uL — ABNORMAL LOW (ref 3.87–5.11)
RDW: 14.6 % (ref 11.5–15.5)
WBC: 7.5 10*3/uL (ref 4.0–10.5)
nRBC: 0 % (ref 0.0–0.2)

## 2022-08-05 LAB — COMPREHENSIVE METABOLIC PANEL
ALT: 17 U/L (ref 0–44)
AST: 26 U/L (ref 15–41)
Albumin: 2.4 g/dL — ABNORMAL LOW (ref 3.5–5.0)
Alkaline Phosphatase: 91 U/L (ref 38–126)
Anion gap: 9 (ref 5–15)
BUN: 5 mg/dL — ABNORMAL LOW (ref 6–20)
CO2: 22 mmol/L (ref 22–32)
Calcium: 6.8 mg/dL — ABNORMAL LOW (ref 8.9–10.3)
Chloride: 109 mmol/L (ref 98–111)
Creatinine, Ser: 0.84 mg/dL (ref 0.44–1.00)
GFR, Estimated: >60 mL/min (ref >60)
Glucose, Bld: 86 mg/dL (ref 70–99)
Potassium: 2.8 mmol/L — ABNORMAL LOW (ref 3.5–5.1)
Sodium: 140 mmol/L (ref 135–145)
Total Bilirubin: 0.2 mg/dL — ABNORMAL LOW (ref 0.3–1.2)
Total Protein: 5 g/dL — ABNORMAL LOW (ref 6.5–8.1)

## 2022-08-05 MED ORDER — POTASSIUM CHLORIDE CRYS ER 20 MEQ PO TBCR
20.0000 meq | EXTENDED_RELEASE_TABLET | Freq: Two times a day (BID) | ORAL | Status: DC
  Administered 2022-08-05 – 2022-08-08 (×6): 20 meq via ORAL
  Filled 2022-08-05 (×6): qty 1

## 2022-08-05 MED ORDER — SODIUM CHLORIDE 0.9 % IV SOLN
500.0000 mg | Freq: Once | INTRAVENOUS | Status: AC
  Administered 2022-08-05: 500 mg via INTRAVENOUS
  Filled 2022-08-05: qty 500

## 2022-08-05 MED ORDER — SERTRALINE HCL 50 MG PO TABS
100.0000 mg | ORAL_TABLET | Freq: Every day | ORAL | Status: DC
  Administered 2022-08-06 – 2022-08-08 (×3): 100 mg via ORAL
  Filled 2022-08-05 (×3): qty 2

## 2022-08-05 MED ORDER — SODIUM CHLORIDE 0.9 % IV SOLN: 510.0000 mg | Freq: Once | INTRAVENOUS | Status: DC

## 2022-08-05 MED ORDER — BUTALBITAL-APAP-CAFFEINE 50-325-40 MG PO TABS: 1.0000 | ORAL_TABLET | ORAL | Status: DC | PRN

## 2022-08-05 MED ORDER — LABETALOL HCL 200 MG PO TABS
400.0000 mg | ORAL_TABLET | Freq: Three times a day (TID) | ORAL | Status: DC
  Administered 2022-08-05 – 2022-08-06 (×3): 400 mg via ORAL
  Filled 2022-08-05 (×3): qty 2

## 2022-08-05 NOTE — Progress Notes (Signed)
Patient ID: Cynthia Robertson, female   DOB: 30-Jul-1991, 31 y.o.   MRN: [id number] Now off Magnesium with good diuretic effect Mild HA No scotomata BP still mildly elevated Edema has improved  VS Date/Time Temp Pulse ECG Heart Rate Resp BP Patient Position (if appropriate) SpO2 O2 Device O2 Flow Rate (L/min) FiO2 (%) Weight Who  08/05/22 1606 -- 77 -- -- 154/75 Abnormal  -- -- -- -- -- -- MH  08/05/22 1534 -- 73 -- -- 148/72 Abnormal  -- -- -- -- -- -- MH  08/05/22 1236 -- 81 -- -- 140/68            DTRs 2/4 1+ edema  PP PIH - s/p mag Will increase labetalol to '400mg'$  TID Fioricet prn HA Wants to increase her zoloft to '100mg'$  q D Will give IV Fe for anemia. Consider d/c tomorrow if BP controlled and sxs improved

## 2022-08-05 NOTE — Progress Notes (Signed)
Subjective: Postpartum Day 6: Cesarean Delivery Patient reports + flatus, + BM, and no problems voiding.   No PIH sxs  Objective: Vital signs in last 24 hours: Temp:  [97.5 F (36.4 C)-98.3 F (36.8 C)] 97.7 F (36.5 C) (10/26 0850) Pulse Rate:  [51-88] 75 (10/26 0850) Resp:  [16-18] 16 (10/26 0850) BP: (139-171)/(67-80) 147/75 (10/26 0850) SpO2:  [97 %-100 %] 99 % (10/26 0850)  Physical Exam:  General: alert, cooperative, appears stated age, and no distress Lochia: appropriate Uterine Fundus: firm Incision: healing well DVT Evaluation: No evidence of DVT seen on physical exam.  Recent Labs    08/04/22 1151 08/05/22 0540  HGB 9.7* 9.2*  HCT 30.6* 28.3*   Normal labs  Assessment/Plan: Assessment/Plan: 31 yo with severe PIH pp POD#6 s/p CS.  Good diuresis on magnesium - S/p IV Labetalol 20/40 in MAU - Increase Labetalol to '400mg'$  BID - Magnesium for seizure proph - PIH labs daily - Continue other home meds   Luz Lex, MD 08/05/2022, 11:18 AM

## 2022-08-06 LAB — COMPREHENSIVE METABOLIC PANEL
ALT: 18 U/L (ref 0–44)
AST: 22 U/L (ref 15–41)
Albumin: 2.4 g/dL — ABNORMAL LOW (ref 3.5–5.0)
Alkaline Phosphatase: 90 U/L (ref 38–126)
Anion gap: 8 (ref 5–15)
BUN: 6 mg/dL (ref 6–20)
CO2: 23 mmol/L (ref 22–32)
Calcium: 7.8 mg/dL — ABNORMAL LOW (ref 8.9–10.3)
Chloride: 111 mmol/L (ref 98–111)
Creatinine, Ser: 0.99 mg/dL (ref 0.44–1.00)
GFR, Estimated: >60 mL/min (ref >60)
Glucose, Bld: 76 mg/dL (ref 70–99)
Potassium: 3.1 mmol/L — ABNORMAL LOW (ref 3.5–5.1)
Sodium: 142 mmol/L (ref 135–145)
Total Bilirubin: 0.6 mg/dL (ref 0.3–1.2)
Total Protein: 5 g/dL — ABNORMAL LOW (ref 6.5–8.1)

## 2022-08-06 LAB — CBC
HCT: 26.8 % — ABNORMAL LOW (ref 36.0–46.0)
Hemoglobin: 9.2 g/dL — ABNORMAL LOW (ref 12.0–15.0)
MCH: 33.5 pg (ref 26.0–34.0)
MCHC: 34.3 g/dL (ref 30.0–36.0)
MCV: 97.5 fL (ref 80.0–100.0)
Platelets: 235 10*3/uL (ref 150–400)
RBC: 2.75 MIL/uL — ABNORMAL LOW (ref 3.87–5.11)
RDW: 14.6 % (ref 11.5–15.5)
WBC: 7.5 10*3/uL (ref 4.0–10.5)
nRBC: 0 % (ref 0.0–0.2)

## 2022-08-06 MED ORDER — NIFEDIPINE ER OSMOTIC RELEASE 30 MG PO TB24
30.0000 mg | ORAL_TABLET | Freq: Two times a day (BID) | ORAL | Status: DC
  Administered 2022-08-06: 30 mg via ORAL
  Filled 2022-08-06: qty 1

## 2022-08-06 MED ORDER — NIFEDIPINE ER OSMOTIC RELEASE 30 MG PO TB24
30.0000 mg | ORAL_TABLET | Freq: Every day | ORAL | Status: DC
  Administered 2022-08-06: 30 mg via ORAL
  Filled 2022-08-06: qty 1

## 2022-08-06 MED ORDER — NIFEDIPINE 10 MG PO CAPS
10.0000 mg | ORAL_CAPSULE | Freq: Once | ORAL | Status: AC
  Administered 2022-08-06: 10 mg via ORAL
  Filled 2022-08-06: qty 1

## 2022-08-06 MED ORDER — LABETALOL HCL 200 MG PO TABS
600.0000 mg | ORAL_TABLET | Freq: Three times a day (TID) | ORAL | Status: DC
  Administered 2022-08-06 – 2022-08-07 (×3): 600 mg via ORAL
  Filled 2022-08-06 (×3): qty 3

## 2022-08-06 MED ORDER — LABETALOL HCL 200 MG PO TABS
200.0000 mg | ORAL_TABLET | Freq: Once | ORAL | Status: AC
  Administered 2022-08-06: 200 mg via ORAL
  Filled 2022-08-06: qty 1

## 2022-08-06 NOTE — Progress Notes (Signed)
Dr. Bridgett Larsson notified of maternal VS. Orders to start Procardia PO, and restart Labetalol protocol at '20mg'$  dose.

## 2022-08-06 NOTE — Progress Notes (Signed)
Progress Note  S: No complaints, but she is appropriately frustrated and misses her baby. Denies HA, RUQ pain, vision changes.  O:  Vitals:   08/06/22 0022 08/06/22 0446  BP: (!) 140/76 (!) 149/75  Pulse: 72 68  Resp: 17 17  Temp: 98.2 F (36.8 C) 98.1 F (36.7 C)  SpO2: 98% 95%    Gen: NAD, A&O Pulm: NWOB Abd: Soft, nontender. Firm fundus. Incision healing well. Ext: No evidence of DV  Labs Recent Results (from the past 2160 hour(s))  Urinalysis, Routine w reflex microscopic Urine, Clean Catch     Status: None   Collection Time: 07/08/22  1:17 PM  Result Value Ref Range   Color, Urine YELLOW YELLOW   APPearance CLEAR CLEAR   Specific Gravity, Urine 1.005 1.005 - 1.030   pH 7.0 5.0 - 8.0   Glucose, UA NEGATIVE NEGATIVE mg/dL   Hgb urine dipstick NEGATIVE NEGATIVE   Bilirubin Urine NEGATIVE NEGATIVE   Ketones, ur NEGATIVE NEGATIVE mg/dL   Protein, ur NEGATIVE NEGATIVE mg/dL   Nitrite NEGATIVE NEGATIVE   Leukocytes,Ua NEGATIVE NEGATIVE    Comment: Performed at [facility] [address], [place], [place] [address]  Protein / creatinine ratio, urine     Status: Abnormal   Collection Time: 07/08/22  1:45 PM  Result Value Ref Range   Creatinine, Urine 47 mg/dL   Total Protein, Urine 9 mg/dL    Comment: NO NORMAL RANGE ESTABLISHED FOR THIS TEST   Protein Creatinine Ratio 0.19 (H) 0.00 - 0.15 mg/mg[Cre]    Comment: Performed at [facility] [address], [place], [place] [address]  CBC     Status: Abnormal   Collection Time: 07/08/22  2:19 PM  Result Value Ref Range   WBC 11.0 (H) 4.0 - 10.5 K/uL   RBC 3.26 (L) 3.87 - 5.11 MIL/uL   Hemoglobin 10.7 (L) 12.0 - 15.0 g/dL   HCT 31.4 (L) 36.0 - 46.0 %   MCV 96.3 80.0 - 100.0 fL   MCH 32.8 26.0 - 34.0 pg   MCHC 34.1 30.0 - 36.0 g/dL   RDW 14.5 11.5 - 15.5 %   Platelets 188 150 - 400 K/uL   nRBC 0.0 0.0 - 0.2 %    Comment: Performed at [facility], [place] [address] [place], [place] [address]   Comprehensive metabolic panel     Status: Abnormal   Collection Time: 07/08/22  2:19 PM  Result Value Ref Range   Sodium 138 135 - 145 mmol/L   Potassium 3.2 (L) 3.5 - 5.1 mmol/L   Chloride 106 98 - 111 mmol/L   CO2 21 (L) 22 - 32 mmol/L   Glucose, Bld 80 70 - 99 mg/dL    Comment: Glucose reference range applies only to samples taken after fasting for at least 8 hours.   BUN 5 (L) 6 - 20 mg/dL   Creatinine, Ser 0.69 0.44 - 1.00 mg/dL   Calcium 9.0 8.9 - 10.3 mg/dL   Total Protein 5.9 (L) 6.5 - 8.1 g/dL   Albumin 2.7 (L) 3.5 - 5.0 g/dL   AST 20 15 - 41 U/L   ALT 13 0 - 44 U/L   Alkaline Phosphatase 108 38 - 126 U/L   Total Bilirubin 0.5 0.3 - 1.2 mg/dL   GFR, Estimated >60 >60 mL/min    Comment: (NOTE) Calculated using the CKD-EPI Creatinine Equation (2021)    Anion gap 11 5 - 15  Comment: Performed at [facility], [place] [address] [place] [address]  OB RESULT CONSOLE Group B Strep     Status: None   Collection Time: 07/14/22 12:00 AM  Result Value Ref Range   GBS Positive   CBC     Status: Abnormal   Collection Time: 07/30/22 12:46 AM  Result Value Ref Range   WBC 10.1 4.0 - 10.5 K/uL   RBC 3.43 (L) 3.87 - 5.11 MIL/uL   Hemoglobin 11.2 (L) 12.0 - 15.0 g/dL   HCT 34.0 (L) 36.0 - 46.0 %   MCV 99.1 80.0 - 100.0 fL   MCH 32.7 26.0 - 34.0 pg   MCHC 32.9 30.0 - 36.0 g/dL   RDW 14.7 11.5 - 15.5 %   Platelets 171 150 - 400 K/uL   nRBC 0.0 0.0 - 0.2 %    Comment: Performed at [facility], [place] [address], [place], [place] [address]  RPR     Status: None   Collection Time: 07/30/22 12:46 AM  Result Value Ref Range   RPR Ser Ql NON REACTIVE NON REACTIVE    Comment: Performed at [facility], [place] [address] [place], [place] [address]  Comprehensive metabolic panel     Status: Abnormal   Collection Time: 07/30/22 12:46 AM  Result Value Ref Range   Sodium 137 135 - 145 mmol/L   Potassium 3.1 (L) 3.5 - 5.1 mmol/L   Chloride 108 98 - 111 mmol/L    CO2 20 (L) 22 - 32 mmol/L   Glucose, Bld 110 (H) 70 - 99 mg/dL    Comment: Glucose reference range applies only to samples taken after fasting for at least 8 hours.   BUN 6 6 - 20 mg/dL   Creatinine, Ser 0.73 0.44 - 1.00 mg/dL   Calcium 9.0 8.9 - 10.3 mg/dL   Total Protein 5.7 (L) 6.5 - 8.1 g/dL   Albumin 2.6 (L) 3.5 - 5.0 g/dL   AST 28 15 - 41 U/L   ALT 18 0 - 44 U/L   Alkaline Phosphatase 130 (H) 38 - 126 U/L   Total Bilirubin 0.4 0.3 - 1.2 mg/dL   GFR, Estimated >60 >60 mL/min    Comment: (NOTE) Calculated using the CKD-EPI Creatinine Equation (2021)    Anion gap 9 5 - 15    Comment: Performed at [facility], [place] [address] [place], [place] [address]  Type and screen [place]     Status: None   Collection Time: 07/30/22 12:48 AM  Result Value Ref Range   ABO/RH(D) O POS    Antibody Screen NEG    Sample Expiration      08/02/2022,2359 Performed at [facility], [place] [address] [place], [place] [address]   CBC     Status: Abnormal   Collection Time: 07/30/22  6:21 PM  Result Value Ref Range   WBC 11.8 (H) 4.0 - 10.5 K/uL   RBC 3.45 (L) 3.87 - 5.11 MIL/uL   Hemoglobin 11.3 (L) 12.0 - 15.0 g/dL   HCT 33.3 (L) 36.0 - 46.0 %   MCV 96.5 80.0 - 100.0 fL   MCH 32.8 26.0 - 34.0 pg   MCHC 33.9 30.0 - 36.0 g/dL   RDW 14.7 11.5 - 15.5 %   Platelets 176 150 - 400 K/uL   nRBC 0.0 0.0 - 0.2 %    Comment: Performed at [facility], [place]  [address] [place], [place] [address]  Surgical pathology     Status: None   Collection Time: 07/31/22 12:29 AM  Result Value Ref Range   SURGICAL PATHOLOGY      SURGICAL PATHOLOGY CASE: [phone] [phone] PATIENT: Cynthia Robertson Surgical Pathology Report     Clinical History: 37w 5d (crm)     FINAL MICROSCOPIC DIAGNOSIS:  A. PLACENTA, SINGLETON, DELIVERY: - Mature third trimester placenta (457 g) - Three vessel umbilical cord      GROSS DESCRIPTION:  Specimen received: Placenta including  membranes and umbilical cord, singleton, received fresh. Size and shape: Discoid, 18 x 18 cm and is up to 2.5 cm thick. Weight: 009 g Umbilical cord: 48 cm in length, averages 1 cm in diameter, has normal tri-vasculature and is inserted 1 cm from the placental margin. Membranes: Scattered cloudiness, inserted marginally. Fetal surface: Smooth pink bluegray with mild subchorionic fibrin deposition. Maternal surface: Complete, with mild fibrin and calcium deposition. Cut surface: Pink-red to dark red spongy parenchyma.  No discrete mass lesions. Block summary: Block 1 = membranes and cord Blocks 2-4 = placenta  SW 08/02/2022     Final Diagnosis performed by Jaquita Folds, MD.   Electronically signed 08/03/2022 Technical and / or Professional components performed at [facility], [place] [address] [place], [place] [address].  Immunohistochemistry Technical component (if applicable) was performed at [facility]. [address], [place], [place], [place] [address].   IMMUNOHISTOCHEMISTRY DISCLAIMER (if applicable): Some of these immunohistochemical stains may have been developed and the performance characteristics determine by [facility]. Some may not have been cleared or approved by the U.S. Food and Drug Administration. The FDA has determined that such clearance or approval is not necessary. This test is used for clinical purposes. It should not be regarded as investigational or for research. This laboratory is certified under the [place] (CLIA-88) as qualified to perform high complexity clinical lab oratory testing.  The controls stained appropriately.   CBC     Status: Abnormal   Collection Time: 07/31/22  5:51 AM  Result Value Ref Range   WBC 12.0 (H) 4.0 - 10.5 K/uL   RBC 3.04 (L) 3.87 - 5.11 MIL/uL   Hemoglobin 9.8 (L) 12.0 - 15.0 g/dL   HCT 29.4 (L) 36.0 - 46.0 %   MCV 96.7  80.0 - 100.0 fL   MCH 32.2 26.0 - 34.0 pg   MCHC 33.3 30.0 - 36.0 g/dL   RDW 14.4 11.5 - 15.5 %   Platelets 150 150 - 400 K/uL   nRBC 0.0 0.0 - 0.2 %    Comment: Performed at [facility], [place] [address] [place] [address]  CBC     Status: Abnormal   Collection Time: 08/04/22 11:51 AM  Result Value Ref Range   WBC 8.3 4.0 - 10.5 K/uL   RBC 2.94 (L) 3.87 - 5.11 MIL/uL   Hemoglobin 9.7 (L) 12.0 - 15.0 g/dL   HCT 30.6 (L) 36.0 - 46.0 %   MCV 104.1 (H) 80.0 - 100.0 fL   MCH 33.0 26.0 - 34.0 pg   MCHC 31.7 30.0 - 36.0 g/dL   RDW 14.6 11.5 - 15.5 %   Platelets 207 150 - 400 K/uL   nRBC 0.0 0.0 - 0.2 %    Comment: Performed at [facility], [place] [address], [place], [place] [address]  Comprehensive metabolic panel     Status: Abnormal   Collection  Time: 08/04/22 12:52 PM  Result Value Ref Range   Sodium 143 135 - 145 mmol/L   Potassium 3.3 (L) 3.5 - 5.1 mmol/L   Chloride 112 (H) 98 - 111 mmol/L   CO2 21 (L) 22 - 32 mmol/L   Glucose, Bld 79 70 - 99 mg/dL    Comment: Glucose reference range applies only to samples taken after fasting for at least 8 hours.   BUN 7 6 - 20 mg/dL   Creatinine, Ser 0.83 0.44 - 1.00 mg/dL   Calcium 8.7 (L) 8.9 - 10.3 mg/dL   Total Protein 5.6 (L) 6.5 - 8.1 g/dL   Albumin 2.6 (L) 3.5 - 5.0 g/dL   AST 33 15 - 41 U/L   ALT 23 0 - 44 U/L   Alkaline Phosphatase 102 38 - 126 U/L   Total Bilirubin 0.6 0.3 - 1.2 mg/dL   GFR, Estimated >60 >60 mL/min    Comment: (NOTE) Calculated using the CKD-EPI Creatinine Equation (2021)    Anion gap 10 5 - 15    Comment: Performed at [facility], [place] [address], [place], [place] [address]  CBC     Status: Abnormal   Collection Time: 08/05/22  5:40 AM  Result Value Ref Range   WBC 7.5 4.0 - 10.5 K/uL   RBC 2.87 (L) 3.87 - 5.11 MIL/uL   Hemoglobin 9.2 (L) 12.0 - 15.0 g/dL   HCT 28.3 (L) 36.0 - 46.0 %   MCV 98.6 80.0 - 100.0 fL   MCH 32.1 26.0 - 34.0 pg   MCHC 32.5 30.0 - 36.0 g/dL   RDW  14.6 11.5 - 15.5 %   Platelets 193 150 - 400 K/uL   nRBC 0.0 0.0 - 0.2 %    Comment: Performed at [facility], [place] [address] [place], [place] [address]  Comprehensive metabolic panel     Status: Abnormal   Collection Time: 08/05/22  5:40 AM  Result Value Ref Range   Sodium 140 135 - 145 mmol/L   Potassium 2.8 (L) 3.5 - 5.1 mmol/L   Chloride 109 98 - 111 mmol/L   CO2 22 22 - 32 mmol/L   Glucose, Bld 86 70 - 99 mg/dL    Comment: Glucose reference range applies only to samples taken after fasting for at least 8 hours.   BUN 5 (L) 6 - 20 mg/dL   Creatinine, Ser 0.84 0.44 - 1.00 mg/dL   Calcium 6.8 (L) 8.9 - 10.3 mg/dL   Total Protein 5.0 (L) 6.5 - 8.1 g/dL   Albumin 2.4 (L) 3.5 - 5.0 g/dL   AST 26 15 - 41 U/L   ALT 17 0 - 44 U/L   Alkaline Phosphatase 91 38 - 126 U/L   Total Bilirubin 0.2 (L) 0.3 - 1.2 mg/dL   GFR, Estimated >60 >60 mL/min    Comment: (NOTE) Calculated using the CKD-EPI Creatinine Equation (2021)    Anion gap 9 5 - 15    Comment: Performed at [facility], [place] [address], [place], [place] [address]  CBC     Status: Abnormal   Collection Time: 08/06/22  5:46 AM  Result Value Ref Range   WBC 7.5 4.0 - 10.5 K/uL   RBC 2.75 (L) 3.87 - 5.11 MIL/uL   Hemoglobin 9.2 (L) 12.0 - 15.0 g/dL   HCT 26.8 (L) 36.0 - 46.0 %   MCV 97.5 80.0 - 100.0 fL   MCH 33.5 26.0 - 34.0 pg  MCHC 34.3 30.0 - 36.0 g/dL   RDW 14.6 11.5 - 15.5 %   Platelets 235 150 - 400 K/uL   nRBC 0.0 0.0 - 0.2 %    Comment: Performed at [facility], [place] [address] [place], [place] [address]  Comprehensive metabolic panel     Status: Abnormal   Collection Time: 08/06/22  5:46 AM  Result Value Ref Range   Sodium 142 135 - 145 mmol/L   Potassium 3.1 (L) 3.5 - 5.1 mmol/L   Chloride 111 98 - 111 mmol/L   CO2 23 22 - 32 mmol/L   Glucose, Bld 76 70 - 99 mg/dL    Comment: Glucose reference range applies only to samples taken after fasting for at least 8 hours.   BUN 6 6 - 20 mg/dL    Creatinine, Ser 0.99 0.44 - 1.00 mg/dL   Calcium 7.8 (L) 8.9 - 10.3 mg/dL   Total Protein 5.0 (L) 6.5 - 8.1 g/dL   Albumin 2.4 (L) 3.5 - 5.0 g/dL   AST 22 15 - 41 U/L   ALT 18 0 - 44 U/L   Alkaline Phosphatase 90 38 - 126 U/L   Total Bilirubin 0.6 0.3 - 1.2 mg/dL   GFR, Estimated >60 >60 mL/min    Comment: (NOTE) Calculated using the CKD-EPI Creatinine Equation (2021)    Anion gap 8 5 - 15    Comment: Performed at [facility], [place] [address] [place], [place] [address]     A/P:  POD7 s/p pCS, readmitted for postpartum severe preeclampsia. S/p magnesium. On labetalol '400mg'$  TID > required '20mg'$  IV lab this morning, increased labetalol to '600mg'$  TID. We discussed if Bps remain elevated this pm, would add on Procardia.  Fioricet prn for HA. S/p IV iron. On zoloft '100mg'$  qday. CBC, CMP wnl. Continue to monitor Bps, likely for dc tomorrow.  Hurshel Party, MD

## 2022-08-06 NOTE — Progress Notes (Signed)
Notified of severe range Bpx2 -  Patient without preE symptoms. BP rechecked with patient sitting on side of bed, mild range a few minutes after taking Procardia 10IR. New BP regimen: labetalol '600mg'$  TID, procardia 30XL BID. Continue close monitoring. I discussed this with the patient - she remains frustrated that she will need to be away from her baby for one more night. We discussed that if her BP control improves, she is likely going to be able to go home tomorrow. Discussed likely diagnosis of cHTN and that we would need to continue to work outpatient on a better regimen, which may include referral to PCP.  Hurshel Party, MD

## 2022-08-07 LAB — COMPREHENSIVE METABOLIC PANEL
ALT: 15 U/L (ref 0–44)
AST: 22 U/L (ref 15–41)
Albumin: 2.5 g/dL — ABNORMAL LOW (ref 3.5–5.0)
Alkaline Phosphatase: 86 U/L (ref 38–126)
Anion gap: 8 (ref 5–15)
BUN: <5 mg/dL — ABNORMAL LOW (ref 6–20)
CO2: 21 mmol/L — ABNORMAL LOW (ref 22–32)
Calcium: 8.2 mg/dL — ABNORMAL LOW (ref 8.9–10.3)
Chloride: 111 mmol/L (ref 98–111)
Creatinine, Ser: 0.94 mg/dL (ref 0.44–1.00)
GFR, Estimated: >60 mL/min (ref >60)
Glucose, Bld: 72 mg/dL (ref 70–99)
Potassium: 3.2 mmol/L — ABNORMAL LOW (ref 3.5–5.1)
Sodium: 140 mmol/L (ref 135–145)
Total Bilirubin: 0.5 mg/dL (ref 0.3–1.2)
Total Protein: 5.2 g/dL — ABNORMAL LOW (ref 6.5–8.1)

## 2022-08-07 LAB — CBC
HCT: 29 % — ABNORMAL LOW (ref 36.0–46.0)
Hemoglobin: 9.6 g/dL — ABNORMAL LOW (ref 12.0–15.0)
MCH: 32.7 pg (ref 26.0–34.0)
MCHC: 33.1 g/dL (ref 30.0–36.0)
MCV: 98.6 fL (ref 80.0–100.0)
Platelets: 254 10*3/uL (ref 150–400)
RBC: 2.94 MIL/uL — ABNORMAL LOW (ref 3.87–5.11)
RDW: 14.6 % (ref 11.5–15.5)
WBC: 7.7 10*3/uL (ref 4.0–10.5)
nRBC: 0 % (ref 0.0–0.2)

## 2022-08-07 MED ORDER — HYDRALAZINE HCL 20 MG/ML IJ SOLN: 10.0000 mg | INTRAMUSCULAR | Status: DC | PRN

## 2022-08-07 MED ORDER — FUROSEMIDE 40 MG PO TABS
40.0000 mg | ORAL_TABLET | Freq: Once | ORAL | Status: AC
  Administered 2022-08-07: 40 mg via ORAL
  Filled 2022-08-07: qty 1

## 2022-08-07 MED ORDER — FUROSEMIDE 20 MG PO TABS
20.0000 mg | ORAL_TABLET | Freq: Every day | ORAL | Status: DC
  Administered 2022-08-08: 20 mg via ORAL
  Filled 2022-08-07: qty 1

## 2022-08-07 MED ORDER — HYDRALAZINE HCL 10 MG PO TABS
5.0000 mg | ORAL_TABLET | Freq: Once | ORAL | Status: AC
  Administered 2022-08-07: 5 mg via ORAL
  Filled 2022-08-07: qty 1

## 2022-08-07 MED ORDER — NIFEDIPINE ER OSMOTIC RELEASE 30 MG PO TB24
30.0000 mg | ORAL_TABLET | Freq: Once | ORAL | Status: AC
  Administered 2022-08-07: 30 mg via ORAL
  Filled 2022-08-07: qty 1

## 2022-08-07 MED ORDER — LABETALOL HCL 5 MG/ML IV SOLN: 20.0000 mg | INTRAVENOUS | Status: DC | PRN

## 2022-08-07 MED ORDER — HYDRALAZINE HCL 10 MG PO TABS
10.0000 mg | ORAL_TABLET | Freq: Four times a day (QID) | ORAL | Status: DC
  Administered 2022-08-07 (×2): 10 mg via ORAL
  Filled 2022-08-07 (×2): qty 1

## 2022-08-07 MED ORDER — LABETALOL HCL 200 MG PO TABS
800.0000 mg | ORAL_TABLET | Freq: Three times a day (TID) | ORAL | Status: DC
  Administered 2022-08-07 – 2022-08-08 (×4): 800 mg via ORAL
  Filled 2022-08-07 (×4): qty 4

## 2022-08-07 MED ORDER — HYDRALAZINE HCL 10 MG PO TABS
10.0000 mg | ORAL_TABLET | Freq: Once | ORAL | Status: AC
  Administered 2022-08-07: 10 mg via ORAL
  Filled 2022-08-07: qty 1

## 2022-08-07 MED ORDER — LABETALOL HCL 5 MG/ML IV SOLN: 40.0000 mg | INTRAVENOUS | Status: DC | PRN

## 2022-08-07 MED ORDER — HYDRALAZINE HCL 20 MG/ML IJ SOLN: 5.0000 mg | INTRAMUSCULAR | Status: DC | PRN

## 2022-08-07 MED ORDER — HYDRALAZINE HCL 10 MG PO TABS
20.0000 mg | ORAL_TABLET | Freq: Four times a day (QID) | ORAL | Status: DC
  Administered 2022-08-07 (×2): 20 mg via ORAL
  Filled 2022-08-07 (×2): qty 2

## 2022-08-07 MED ORDER — HYDROCHLOROTHIAZIDE 12.5 MG PO TABS
12.5000 mg | ORAL_TABLET | Freq: Every day | ORAL | Status: DC
  Administered 2022-08-07 – 2022-08-08 (×2): 12.5 mg via ORAL
  Filled 2022-08-07 (×2): qty 1

## 2022-08-07 MED ORDER — NIFEDIPINE ER OSMOTIC RELEASE 60 MG PO TB24
60.0000 mg | ORAL_TABLET | Freq: Two times a day (BID) | ORAL | Status: DC
  Administered 2022-08-07 – 2022-08-08 (×3): 60 mg via ORAL
  Filled 2022-08-07 (×3): qty 1

## 2022-08-07 MED ORDER — HYDRALAZINE HCL 50 MG PO TABS
25.0000 mg | ORAL_TABLET | Freq: Four times a day (QID) | ORAL | Status: DC
  Administered 2022-08-08 (×2): 25 mg via ORAL
  Filled 2022-08-07 (×2): qty 1

## 2022-08-07 NOTE — Progress Notes (Signed)
Progress Note  S: Feeling frustrated. No PIH symptoms.  O:  Vitals:   08/07/22 0120 08/07/22 0250  BP: (!) 166/91 (!) 154/81  Pulse: 79 91  Resp:  17  Temp:  99.9 F (37.7 C)  SpO2:  97%    Gen: NAD, A&O Pulm: NWOB Abd: Soft, nontender. Firm fundus. Incision healing well. Ext: No evidence of DV  Labs Recent Results (from the past 2160 hour(s))  Urinalysis, Routine w reflex microscopic Urine, Clean Catch     Status: None   Collection Time: 07/08/22  1:17 PM  Result Value Ref Range   Color, Urine YELLOW YELLOW   APPearance CLEAR CLEAR   Specific Gravity, Urine 1.005 1.005 - 1.030   pH 7.0 5.0 - 8.0   Glucose, UA NEGATIVE NEGATIVE mg/dL   Hgb urine dipstick NEGATIVE NEGATIVE   Bilirubin Urine NEGATIVE NEGATIVE   Ketones, ur NEGATIVE NEGATIVE mg/dL   Protein, ur NEGATIVE NEGATIVE mg/dL   Nitrite NEGATIVE NEGATIVE   Leukocytes,Ua NEGATIVE NEGATIVE    Comment: Performed at [facility] [address] [place], [place] [address]  Protein / creatinine ratio, urine     Status: Abnormal   Collection Time: 07/08/22  1:45 PM  Result Value Ref Range   Creatinine, Urine 47 mg/dL   Total Protein, Urine 9 mg/dL    Comment: NO NORMAL RANGE ESTABLISHED FOR THIS TEST   Protein Creatinine Ratio 0.19 (H) 0.00 - 0.15 mg/mg[Cre]    Comment: Performed at [facility] [address], [place], [place] [address]  CBC     Status: Abnormal   Collection Time: 07/08/22  2:19 PM  Result Value Ref Range   WBC 11.0 (H) 4.0 - 10.5 K/uL   RBC 3.26 (L) 3.87 - 5.11 MIL/uL   Hemoglobin 10.7 (L) 12.0 - 15.0 g/dL   HCT 31.4 (L) 36.0 - 46.0 %   MCV 96.3 80.0 - 100.0 fL   MCH 32.8 26.0 - 34.0 pg   MCHC 34.1 30.0 - 36.0 g/dL   RDW 14.5 11.5 - 15.5 %   Platelets 188 150 - 400 K/uL   nRBC 0.0 0.0 - 0.2 %    Comment: Performed at [facility], [place] [address] [place], [place] [address]  Comprehensive metabolic panel     Status: Abnormal   Collection Time: 07/08/22  2:19 PM  Result  Value Ref Range   Sodium 138 135 - 145 mmol/L   Potassium 3.2 (L) 3.5 - 5.1 mmol/L   Chloride 106 98 - 111 mmol/L   CO2 21 (L) 22 - 32 mmol/L   Glucose, Bld 80 70 - 99 mg/dL    Comment: Glucose reference range applies only to samples taken after fasting for at least 8 hours.   BUN 5 (L) 6 - 20 mg/dL   Creatinine, Ser 0.69 0.44 - 1.00 mg/dL   Calcium 9.0 8.9 - 10.3 mg/dL   Total Protein 5.9 (L) 6.5 - 8.1 g/dL   Albumin 2.7 (L) 3.5 - 5.0 g/dL   AST 20 15 - 41 U/L   ALT 13 0 - 44 U/L   Alkaline Phosphatase 108 38 - 126 U/L   Total Bilirubin 0.5 0.3 - 1.2 mg/dL   GFR, Estimated >60 >60 mL/min    Comment: (NOTE) Calculated using the CKD-EPI Creatinine Equation (2021)    Anion gap 11 5 - 15    Comment: Performed at [facility], [place] [address] [place],  [address]  OB RESULT CONSOLE Group B Strep     Status: None   Collection Time: 07/14/22 12:00 AM  Result Value Ref Range   GBS Positive   CBC     Status: Abnormal   Collection Time: 07/30/22 12:46 AM  Result Value Ref Range   WBC 10.1 4.0 - 10.5 K/uL   RBC 3.43 (L) 3.87 - 5.11 MIL/uL   Hemoglobin 11.2 (L) 12.0 - 15.0 g/dL   HCT 34.0 (L) 36.0 - 46.0 %   MCV 99.1 80.0 - 100.0 fL   MCH 32.7 26.0 - 34.0 pg   MCHC 32.9 30.0 - 36.0 g/dL   RDW 14.7 11.5 - 15.5 %   Platelets 171 150 - 400 K/uL   nRBC 0.0 0.0 - 0.2 %    Comment: Performed at [facility], [place]. [address], [place], [place] [address]  RPR     Status: None   Collection Time: 07/30/22 12:46 AM  Result Value Ref Range   RPR Ser Ql NON REACTIVE NON REACTIVE    Comment: Performed at [facility], [place] [address] [place], [place] [address]  Comprehensive metabolic panel     Status: Abnormal   Collection Time: 07/30/22 12:46 AM  Result Value Ref Range   Sodium 137 135 - 145 mmol/L   Potassium 3.1 (L) 3.5 - 5.1 mmol/L   Chloride 108 98 - 111 mmol/L   CO2 20 (L) 22 - 32 mmol/L   Glucose, Bld 110 (H) 70 - 99 mg/dL    Comment: Glucose reference range  applies only to samples taken after fasting for at least 8 hours.   BUN 6 6 - 20 mg/dL   Creatinine, Ser 0.73 0.44 - 1.00 mg/dL   Calcium 9.0 8.9 - 10.3 mg/dL   Total Protein 5.7 (L) 6.5 - 8.1 g/dL   Albumin 2.6 (L) 3.5 - 5.0 g/dL   AST 28 15 - 41 U/L   ALT 18 0 - 44 U/L   Alkaline Phosphatase 130 (H) 38 - 126 U/L   Total Bilirubin 0.4 0.3 - 1.2 mg/dL   GFR, Estimated >60 >60 mL/min    Comment: (NOTE) Calculated using the CKD-EPI Creatinine Equation (2021)    Anion gap 9 5 - 15    Comment: Performed at [facility], [place] [address], [place], [place] [address]  Type and screen [place]     Status: None   Collection Time: 07/30/22 12:48 AM  Result Value Ref Range   ABO/RH(D) O POS    Antibody Screen NEG    Sample Expiration      08/02/2022,2359 Performed at [facility], [place] [address] [place], [place] [address]   CBC     Status: Abnormal   Collection Time: 07/30/22  6:21 PM  Result Value Ref Range   WBC 11.8 (H) 4.0 - 10.5 K/uL   RBC 3.45 (L) 3.87 - 5.11 MIL/uL   Hemoglobin 11.3 (L) 12.0 - 15.0 g/dL   HCT 33.3 (L) 36.0 - 46.0 %   MCV 96.5 80.0 - 100.0 fL   MCH 32.8 26.0 - 34.0 pg   MCHC 33.9 30.0 - 36.0 g/dL   RDW 14.7 11.5 - 15.5 %   Platelets 176 150 - 400 K/uL   nRBC 0.0 0.0 - 0.2 %    Comment: Performed at [facility], [place] [address], [place],  [address]  Surgical pathology     Status: None  Collection Time: 07/31/22 12:29 AM  Result Value Ref Range   SURGICAL PATHOLOGY      SURGICAL PATHOLOGY CASE: [id number] PATIENT: Elberta Hallgren Surgical Pathology Report     Clinical History: 37w 5d (crm)     FINAL MICROSCOPIC DIAGNOSIS:  A. PLACENTA, SINGLETON, DELIVERY: - Mature third trimester placenta (457 g) - Three vessel umbilical cord      GROSS DESCRIPTION:  Specimen received: Placenta including membranes and umbilical cord, singleton, received fresh. Size and shape: Discoid, 18 x 18 cm and is up to  2.5 cm thick. Weight: 735 g Umbilical cord: 48 cm in length, averages 1 cm in diameter, has normal tri-vasculature and is inserted 1 cm from the placental margin. Membranes: Scattered cloudiness, inserted marginally. Fetal surface: Smooth pink bluegray with mild subchorionic fibrin deposition. Maternal surface: Complete, with mild fibrin and calcium deposition. Cut surface: Pink-red to dark red spongy parenchyma.  No discrete mass lesions. Block summary: Block 1 = membranes and cord Blocks 2-4 = placenta  SW 08/02/2022     Final Diagnosis performed by Jaquita Folds, MD.   Electronically signed 08/03/2022 Technical and / or Professional components performed at [facility], [place] [address], [place], [place] [address].  Immunohistochemistry Technical component (if applicable) was performed at [facility]. [address], [place], [place], [place] [address].   IMMUNOHISTOCHEMISTRY DISCLAIMER (if applicable): Some of these immunohistochemical stains may have been developed and the performance characteristics determine by [facility]. Some may not have been cleared or approved by the U.S. Food and Drug Administration. The FDA has determined that such clearance or approval is not necessary. This test is used for clinical purposes. It should not be regarded as investigational or for research. This laboratory is certified under the [place] (CLIA-88) as qualified to perform high complexity clinical lab oratory testing.  The controls stained appropriately.   CBC     Status: Abnormal   Collection Time: 07/31/22  5:51 AM  Result Value Ref Range   WBC 12.0 (H) 4.0 - 10.5 K/uL   RBC 3.04 (L) 3.87 - 5.11 MIL/uL   Hemoglobin 9.8 (L) 12.0 - 15.0 g/dL   HCT 29.4 (L) 36.0 - 46.0 %   MCV 96.7 80.0 - 100.0 fL   MCH 32.2 26.0 - 34.0 pg   MCHC 33.3 30.0 - 36.0 g/dL   RDW 14.4 11.5 - 15.5 %    Platelets 150 150 - 400 K/uL   nRBC 0.0 0.0 - 0.2 %    Comment: Performed at [facility], [place] [address], [place] [address]  CBC     Status: Abnormal   Collection Time: 08/04/22 11:51 AM  Result Value Ref Range   WBC 8.3 4.0 - 10.5 K/uL   RBC 2.94 (L) 3.87 - 5.11 MIL/uL   Hemoglobin 9.7 (L) 12.0 - 15.0 g/dL   HCT 30.6 (L) 36.0 - 46.0 %   MCV 104.1 (H) 80.0 - 100.0 fL   MCH 33.0 26.0 - 34.0 pg   MCHC 31.7 30.0 - 36.0 g/dL   RDW 14.6 11.5 - 15.5 %   Platelets 207 150 - 400 K/uL   nRBC 0.0 0.0 - 0.2 %    Comment: Performed at [facility], [place] [address], [place], [place] [address]  Comprehensive metabolic panel     Status: Abnormal   Collection Time: 08/04/22 12:52 PM  Result Value Ref Range   Sodium 143 135 -  145 mmol/L   Potassium 3.3 (L) 3.5 - 5.1 mmol/L   Chloride 112 (H) 98 - 111 mmol/L   CO2 21 (L) 22 - 32 mmol/L   Glucose, Bld 79 70 - 99 mg/dL    Comment: Glucose reference range applies only to samples taken after fasting for at least 8 hours.   BUN 7 6 - 20 mg/dL   Creatinine, Ser 0.83 0.44 - 1.00 mg/dL   Calcium 8.7 (L) 8.9 - 10.3 mg/dL   Total Protein 5.6 (L) 6.5 - 8.1 g/dL   Albumin 2.6 (L) 3.5 - 5.0 g/dL   AST 33 15 - 41 U/L   ALT 23 0 - 44 U/L   Alkaline Phosphatase 102 38 - 126 U/L   Total Bilirubin 0.6 0.3 - 1.2 mg/dL   GFR, Estimated >60 >60 mL/min    Comment: (NOTE) Calculated using the CKD-EPI Creatinine Equation (2021)    Anion gap 10 5 - 15    Comment: Performed at [facility], [place] [address], [place], [place] [address]  CBC     Status: Abnormal   Collection Time: 08/05/22  5:40 AM  Result Value Ref Range   WBC 7.5 4.0 - 10.5 K/uL   RBC 2.87 (L) 3.87 - 5.11 MIL/uL   Hemoglobin 9.2 (L) 12.0 - 15.0 g/dL   HCT 28.3 (L) 36.0 - 46.0 %   MCV 98.6 80.0 - 100.0 fL   MCH 32.1 26.0 - 34.0 pg   MCHC 32.5 30.0 - 36.0 g/dL   RDW 14.6 11.5 - 15.5 %   Platelets 193 150 - 400 K/uL   nRBC 0.0 0.0 - 0.2 %    Comment: Performed at  [facility], [place] [address], [place], [place] [address]  Comprehensive metabolic panel     Status: Abnormal   Collection Time: 08/05/22  5:40 AM  Result Value Ref Range   Sodium 140 135 - 145 mmol/L   Potassium 2.8 (L) 3.5 - 5.1 mmol/L   Chloride 109 98 - 111 mmol/L   CO2 22 22 - 32 mmol/L   Glucose, Bld 86 70 - 99 mg/dL    Comment: Glucose reference range applies only to samples taken after fasting for at least 8 hours.   BUN 5 (L) 6 - 20 mg/dL   Creatinine, Ser 0.84 0.44 - 1.00 mg/dL   Calcium 6.8 (L) 8.9 - 10.3 mg/dL   Total Protein 5.0 (L) 6.5 - 8.1 g/dL   Albumin 2.4 (L) 3.5 - 5.0 g/dL   AST 26 15 - 41 U/L   ALT 17 0 - 44 U/L   Alkaline Phosphatase 91 38 - 126 U/L   Total Bilirubin 0.2 (L) 0.3 - 1.2 mg/dL   GFR, Estimated >60 >60 mL/min    Comment: (NOTE) Calculated using the CKD-EPI Creatinine Equation (2021)    Anion gap 9 5 - 15    Comment: Performed at [facility], [place] [address] [place], [place] [address]  CBC     Status: Abnormal   Collection Time: 08/06/22  5:46 AM  Result Value Ref Range   WBC 7.5 4.0 - 10.5 K/uL   RBC 2.75 (L) 3.87 - 5.11 MIL/uL   Hemoglobin 9.2 (L) 12.0 - 15.0 g/dL   HCT 26.8 (L) 36.0 - 46.0 %   MCV 97.5 80.0 - 100.0 fL   MCH 33.5 26.0 - 34.0 pg   MCHC 34.3 30.0 - 36.0 g/dL   RDW 14.6 11.5 - 15.5 %  Platelets 235 150 - 400 K/uL   nRBC 0.0 0.0 - 0.2 %    Comment: Performed at [facility], [place] [address], [place], [place] [address]  Comprehensive metabolic panel     Status: Abnormal   Collection Time: 08/06/22  5:46 AM  Result Value Ref Range   Sodium 142 135 - 145 mmol/L   Potassium 3.1 (L) 3.5 - 5.1 mmol/L   Chloride 111 98 - 111 mmol/L   CO2 23 22 - 32 mmol/L   Glucose, Bld 76 70 - 99 mg/dL    Comment: Glucose reference range applies only to samples taken after fasting for at least 8 hours.   BUN 6 6 - 20 mg/dL   Creatinine, Ser 0.99 0.44 - 1.00 mg/dL   Calcium 7.8 (L) 8.9 - 10.3 mg/dL   Total Protein 5.0 (L)  6.5 - 8.1 g/dL   Albumin 2.4 (L) 3.5 - 5.0 g/dL   AST 22 15 - 41 U/L   ALT 18 0 - 44 U/L   Alkaline Phosphatase 90 38 - 126 U/L   Total Bilirubin 0.6 0.3 - 1.2 mg/dL   GFR, Estimated >60 >60 mL/min    Comment: (NOTE) Calculated using the CKD-EPI Creatinine Equation (2021)    Anion gap 8 5 - 15    Comment: Performed at [facility], [place] [address] [place], [place] [address]  CBC     Status: Abnormal   Collection Time: 08/07/22  5:15 AM  Result Value Ref Range   WBC 7.7 4.0 - 10.5 K/uL   RBC 2.94 (L) 3.87 - 5.11 MIL/uL   Hemoglobin 9.6 (L) 12.0 - 15.0 g/dL   HCT 29.0 (L) 36.0 - 46.0 %   MCV 98.6 80.0 - 100.0 fL   MCH 32.7 26.0 - 34.0 pg   MCHC 33.1 30.0 - 36.0 g/dL   RDW 14.6 11.5 - 15.5 %   Platelets 254 150 - 400 K/uL   nRBC 0.0 0.0 - 0.2 %    Comment: Performed at [facility], [place] [address] [place], [place] [address]  Comprehensive metabolic panel     Status: Abnormal   Collection Time: 08/07/22  5:15 AM  Result Value Ref Range   Sodium 140 135 - 145 mmol/L   Potassium 3.2 (L) 3.5 - 5.1 mmol/L   Chloride 111 98 - 111 mmol/L   CO2 21 (L) 22 - 32 mmol/L   Glucose, Bld 72 70 - 99 mg/dL    Comment: Glucose reference range applies only to samples taken after fasting for at least 8 hours.   BUN <5 (L) 6 - 20 mg/dL   Creatinine, Ser 0.94 0.44 - 1.00 mg/dL   Calcium 8.2 (L) 8.9 - 10.3 mg/dL   Total Protein 5.2 (L) 6.5 - 8.1 g/dL   Albumin 2.5 (L) 3.5 - 5.0 g/dL   AST 22 15 - 41 U/L   ALT 15 0 - 44 U/L   Alkaline Phosphatase 86 38 - 126 U/L   Total Bilirubin 0.5 0.3 - 1.2 mg/dL   GFR, Estimated >60 >60 mL/min    Comment: (NOTE) Calculated using the CKD-EPI Creatinine Equation (2021)    Anion gap 8 5 - 15    Comment: Performed at [facility], [place] [address], [place], [place] [address]     A/P:  POD8 s/p pCS, readmitted for postpartum severe preeclampsia. S/p magnesium. On labetalol '400mg'$  TID initially > increased labetalol to '600mg'$  TID and added  Procardia 60 BID for persistent intermittent severe range Bps requiring IV treatment. Added Hydral '10mg'$  QID today and increased labetalol to '800mg'$  TID. We had an extensive discussion this morning - suspect this is worsened cHTN as she is asymptomatic. She is appropriately frustrated, reassurance provided that this is temporary. Some dependent edema noted around hip area from lying in bed - will order one dose of Lasix '40mg'$ . Fioricet prn for HA. S/p IV iron. On zoloft '100mg'$  qday. CBC, CMP wnl. Continue current management.  Hurshel Party, MD

## 2022-08-08 MED ORDER — SENNOSIDES-DOCUSATE SODIUM 8.6-50 MG PO TABS: 2.0000 | ORAL_TABLET | Freq: Every evening | ORAL | 0 refills | Status: AC | PRN

## 2022-08-08 MED ORDER — HYDRALAZINE HCL 25 MG PO TABS: 25.0000 mg | ORAL_TABLET | Freq: Four times a day (QID) | ORAL | 0 refills | Status: AC

## 2022-08-08 MED ORDER — SENNOSIDES-DOCUSATE SODIUM 8.6-50 MG PO TABS: 2.0000 | ORAL_TABLET | Freq: Every evening | ORAL | Status: DC | PRN

## 2022-08-08 MED ORDER — ACETAMINOPHEN 500 MG PO TABS: 1000.0000 mg | ORAL_TABLET | Freq: Four times a day (QID) | ORAL | Status: DC | PRN

## 2022-08-08 MED ORDER — NIFEDIPINE ER 60 MG PO TB24: 60.0000 mg | ORAL_TABLET | Freq: Two times a day (BID) | ORAL | 1 refills | Status: AC

## 2022-08-08 MED ORDER — HYDROCHLOROTHIAZIDE 12.5 MG PO TABS: 12.5000 mg | ORAL_TABLET | Freq: Every day | ORAL | 1 refills | Status: AC

## 2022-08-08 MED ORDER — LABETALOL HCL 200 MG PO TABS: 800.0000 mg | ORAL_TABLET | Freq: Three times a day (TID) | ORAL | 0 refills | Status: AC

## 2022-08-08 NOTE — Progress Notes (Signed)
Progress Note  S: Feeling well this morning, no PIH sxs. BP improving.  O:  Vitals:   08/08/22 0430 08/08/22 0613  BP: (!) 143/82   Pulse: 96 93  Resp:    Temp:    SpO2:      Gen: NAD, A&O Pulm: NWOB Abd: Soft, nontender. Firm fundus. Incision healing well. Ext: No evidence of DV  Labs Recent Results (from the past 2160 hour(s))  Urinalysis, Routine w reflex microscopic Urine, Clean Catch     Status: None   Collection Time: 07/08/22  1:17 PM  Result Value Ref Range   Color, Urine YELLOW YELLOW   APPearance CLEAR CLEAR   Specific Gravity, Urine 1.005 1.005 - 1.030   pH 7.0 5.0 - 8.0   Glucose, UA NEGATIVE NEGATIVE mg/dL   Hgb urine dipstick NEGATIVE NEGATIVE   Bilirubin Urine NEGATIVE NEGATIVE   Ketones, ur NEGATIVE NEGATIVE mg/dL   Protein, ur NEGATIVE NEGATIVE mg/dL   Nitrite NEGATIVE NEGATIVE   Leukocytes,Ua NEGATIVE NEGATIVE    Comment: Performed at [facility] [address] [place], [place] [address]  Protein / creatinine ratio, urine     Status: Abnormal   Collection Time: 07/08/22  1:45 PM  Result Value Ref Range   Creatinine, Urine 47 mg/dL   Total Protein, Urine 9 mg/dL    Comment: NO NORMAL RANGE ESTABLISHED FOR THIS TEST   Protein Creatinine Ratio 0.19 (H) 0.00 - 0.15 mg/mg[Cre]    Comment: Performed at [facility] [address], [place], [place] [address]  CBC     Status: Abnormal   Collection Time: 07/08/22  2:19 PM  Result Value Ref Range   WBC 11.0 (H) 4.0 - 10.5 K/uL   RBC 3.26 (L) 3.87 - 5.11 MIL/uL   Hemoglobin 10.7 (L) 12.0 - 15.0 g/dL   HCT 31.4 (L) 36.0 - 46.0 %   MCV 96.3 80.0 - 100.0 fL   MCH 32.8 26.0 - 34.0 pg   MCHC 34.1 30.0 - 36.0 g/dL   RDW 14.5 11.5 - 15.5 %   Platelets 188 150 - 400 K/uL   nRBC 0.0 0.0 - 0.2 %    Comment: Performed at [facility], [place] [address] [place], [place] [address]  Comprehensive metabolic panel     Status: Abnormal   Collection Time: 07/08/22  2:19 PM  Result Value Ref Range    Sodium 138 135 - 145 mmol/L   Potassium 3.2 (L) 3.5 - 5.1 mmol/L   Chloride 106 98 - 111 mmol/L   CO2 21 (L) 22 - 32 mmol/L   Glucose, Bld 80 70 - 99 mg/dL    Comment: Glucose reference range applies only to samples taken after fasting for at least 8 hours.   BUN 5 (L) 6 - 20 mg/dL   Creatinine, Ser 0.69 0.44 - 1.00 mg/dL   Calcium 9.0 8.9 - 10.3 mg/dL   Total Protein 5.9 (L) 6.5 - 8.1 g/dL   Albumin 2.7 (L) 3.5 - 5.0 g/dL   AST 20 15 - 41 U/L   ALT 13 0 - 44 U/L   Alkaline Phosphatase 108 38 - 126 U/L   Total Bilirubin 0.5 0.3 - 1.2 mg/dL   GFR, Estimated >60 >60 mL/min    Comment: (NOTE) Calculated using the CKD-EPI Creatinine Equation (2021)    Anion gap 11 5 - 15    Comment: Performed at [facility], [place] [address], [place], [place] [address]  OB RESULT CONSOLE Group B Strep     Status: None   Collection Time: 07/14/22 12:00 AM  Result Value Ref Range   GBS Positive   CBC     Status: Abnormal   Collection Time: 07/30/22 12:46 AM  Result Value Ref Range   WBC 10.1 4.0 - 10.5 K/uL   RBC 3.43 (L) 3.87 - 5.11 MIL/uL   Hemoglobin 11.2 (L) 12.0 - 15.0 g/dL   HCT 34.0 (L) 36.0 - 46.0 %   MCV 99.1 80.0 - 100.0 fL   MCH 32.7 26.0 - 34.0 pg   MCHC 32.9 30.0 - 36.0 g/dL   RDW 14.7 11.5 - 15.5 %   Platelets 171 150 - 400 K/uL   nRBC 0.0 0.0 - 0.2 %    Comment: Performed at [facility], [place]. [address], [place], [place] [address]  RPR     Status: None   Collection Time: 07/30/22 12:46 AM  Result Value Ref Range   RPR Ser Ql NON REACTIVE NON REACTIVE    Comment: Performed at [facility], [place] [address], [place], [place] [address]  Comprehensive metabolic panel     Status: Abnormal   Collection Time: 07/30/22 12:46 AM  Result Value Ref Range   Sodium 137 135 - 145 mmol/L   Potassium 3.1 (L) 3.5 - 5.1 mmol/L   Chloride 108 98 - 111 mmol/L   CO2 20 (L) 22 - 32 mmol/L   Glucose, Bld 110 (H) 70 - 99 mg/dL    Comment: Glucose reference range applies only to  samples taken after fasting for at least 8 hours.   BUN 6 6 - 20 mg/dL   Creatinine, Ser 0.73 0.44 - 1.00 mg/dL   Calcium 9.0 8.9 - 10.3 mg/dL   Total Protein 5.7 (L) 6.5 - 8.1 g/dL   Albumin 2.6 (L) 3.5 - 5.0 g/dL   AST 28 15 - 41 U/L   ALT 18 0 - 44 U/L   Alkaline Phosphatase 130 (H) 38 - 126 U/L   Total Bilirubin 0.4 0.3 - 1.2 mg/dL   GFR, Estimated >60 >60 mL/min    Comment: (NOTE) Calculated using the CKD-EPI Creatinine Equation (2021)    Anion gap 9 5 - 15    Comment: Performed at [facility], [place] [address], [place], [place] [address]  Type and screen [place]     Status: None   Collection Time: 07/30/22 12:48 AM  Result Value Ref Range   ABO/RH(D) O POS    Antibody Screen NEG    Sample Expiration      08/02/2022,2359 Performed at [facility], [place] [address], [place], [place] [address]   CBC     Status: Abnormal   Collection Time: 07/30/22  6:21 PM  Result Value Ref Range   WBC 11.8 (H) 4.0 - 10.5 K/uL   RBC 3.45 (L) 3.87 - 5.11 MIL/uL   Hemoglobin 11.3 (L) 12.0 - 15.0 g/dL   HCT 33.3 (L) 36.0 - 46.0 %   MCV 96.5 80.0 - 100.0 fL   MCH 32.8 26.0 - 34.0 pg   MCHC 33.9 30.0 - 36.0 g/dL   RDW 14.7 11.5 - 15.5 %   Platelets 176 150 - 400 K/uL   nRBC 0.0 0.0 - 0.2 %    Comment: Performed at [facility], [place] [address], [place], [place] [address]  Surgical pathology     Status: None  Collection Time: 07/31/22 12:29 AM  Result Value Ref Range   SURGICAL PATHOLOGY      SURGICAL PATHOLOGY CASE: [id number] PATIENT: Eveleen Hover Surgical Pathology Report     Clinical History: 37w 5d (crm)     FINAL MICROSCOPIC DIAGNOSIS:  A. PLACENTA, SINGLETON, DELIVERY: - Mature third trimester placenta (457 g) - Three vessel umbilical cord      GROSS DESCRIPTION:  Specimen received: Placenta including membranes and umbilical cord, singleton, received fresh. Size and shape: Discoid, 18 x 18 cm and is up to 2.5 cm  thick. Weight: 149 g Umbilical cord: 48 cm in length, averages 1 cm in diameter, has normal tri-vasculature and is inserted 1 cm from the placental margin. Membranes: Scattered cloudiness, inserted marginally. Fetal surface: Smooth pink bluegray with mild subchorionic fibrin deposition. Maternal surface: Complete, with mild fibrin and calcium deposition. Cut surface: Pink-red to dark red spongy parenchyma.  No discrete mass lesions. Block summary: Block 1 = membranes and cord Blocks 2-4 = placenta  SW 08/02/2022     Final Diagnosis performed by Jaquita Folds, MD.   Electronically signed 08/03/2022 Technical and / or Professional components performed at [facility], [place] [address] [place], [place] [address].  Immunohistochemistry Technical component (if applicable) was performed at [facility]. [address], [place], [place], [place] [address].   IMMUNOHISTOCHEMISTRY DISCLAIMER (if applicable): Some of these immunohistochemical stains may have been developed and the performance characteristics determine by [facility]. Some may not have been cleared or approved by the U.S. Food and Drug Administration. The FDA has determined that such clearance or approval is not necessary. This test is used for clinical purposes. It should not be regarded as investigational or for research. This laboratory is certified under the [place] (CLIA-88) as qualified to perform high complexity clinical lab oratory testing.  The controls stained appropriately.   CBC     Status: Abnormal   Collection Time: 07/31/22  5:51 AM  Result Value Ref Range   WBC 12.0 (H) 4.0 - 10.5 K/uL   RBC 3.04 (L) 3.87 - 5.11 MIL/uL   Hemoglobin 9.8 (L) 12.0 - 15.0 g/dL   HCT 29.4 (L) 36.0 - 46.0 %   MCV 96.7 80.0 - 100.0 fL   MCH 32.2 26.0 - 34.0 pg   MCHC 33.3 30.0 - 36.0 g/dL   RDW 14.4 11.5 - 15.5 %   Platelets 150  150 - 400 K/uL   nRBC 0.0 0.0 - 0.2 %    Comment: Performed at [facility], [place] [address] [place] [address]  CBC     Status: Abnormal   Collection Time: 08/04/22 11:51 AM  Result Value Ref Range   WBC 8.3 4.0 - 10.5 K/uL   RBC 2.94 (L) 3.87 - 5.11 MIL/uL   Hemoglobin 9.7 (L) 12.0 - 15.0 g/dL   HCT 30.6 (L) 36.0 - 46.0 %   MCV 104.1 (H) 80.0 - 100.0 fL   MCH 33.0 26.0 - 34.0 pg   MCHC 31.7 30.0 - 36.0 g/dL   RDW 14.6 11.5 - 15.5 %   Platelets 207 150 - 400 K/uL   nRBC 0.0 0.0 - 0.2 %    Comment: Performed at [facility], [place] [address], [place], [place] [address]  Comprehensive metabolic panel     Status: Abnormal   Collection Time: 08/04/22 12:52 PM  Result Value Ref Range   Sodium 143 135 -  145 mmol/L   Potassium 3.3 (L) 3.5 - 5.1 mmol/L   Chloride 112 (H) 98 - 111 mmol/L   CO2 21 (L) 22 - 32 mmol/L   Glucose, Bld 79 70 - 99 mg/dL    Comment: Glucose reference range applies only to samples taken after fasting for at least 8 hours.   BUN 7 6 - 20 mg/dL   Creatinine, Ser 0.83 0.44 - 1.00 mg/dL   Calcium 8.7 (L) 8.9 - 10.3 mg/dL   Total Protein 5.6 (L) 6.5 - 8.1 g/dL   Albumin 2.6 (L) 3.5 - 5.0 g/dL   AST 33 15 - 41 U/L   ALT 23 0 - 44 U/L   Alkaline Phosphatase 102 38 - 126 U/L   Total Bilirubin 0.6 0.3 - 1.2 mg/dL   GFR, Estimated >60 >60 mL/min    Comment: (NOTE) Calculated using the CKD-EPI Creatinine Equation (2021)    Anion gap 10 5 - 15    Comment: Performed at [facility], [place] [address] [place], [place] [address]  CBC     Status: Abnormal   Collection Time: 08/05/22  5:40 AM  Result Value Ref Range   WBC 7.5 4.0 - 10.5 K/uL   RBC 2.87 (L) 3.87 - 5.11 MIL/uL   Hemoglobin 9.2 (L) 12.0 - 15.0 g/dL   HCT 28.3 (L) 36.0 - 46.0 %   MCV 98.6 80.0 - 100.0 fL   MCH 32.1 26.0 - 34.0 pg   MCHC 32.5 30.0 - 36.0 g/dL   RDW 14.6 11.5 - 15.5 %   Platelets 193 150 - 400 K/uL   nRBC 0.0 0.0 - 0.2 %    Comment: Performed at [facility], [place] [address] [place], [place] [address]  Comprehensive metabolic panel     Status: Abnormal   Collection Time: 08/05/22  5:40 AM  Result Value Ref Range   Sodium 140 135 - 145 mmol/L   Potassium 2.8 (L) 3.5 - 5.1 mmol/L   Chloride 109 98 - 111 mmol/L   CO2 22 22 - 32 mmol/L   Glucose, Bld 86 70 - 99 mg/dL    Comment: Glucose reference range applies only to samples taken after fasting for at least 8 hours.   BUN 5 (L) 6 - 20 mg/dL   Creatinine, Ser 0.84 0.44 - 1.00 mg/dL   Calcium 6.8 (L) 8.9 - 10.3 mg/dL   Total Protein 5.0 (L) 6.5 - 8.1 g/dL   Albumin 2.4 (L) 3.5 - 5.0 g/dL   AST 26 15 - 41 U/L   ALT 17 0 - 44 U/L   Alkaline Phosphatase 91 38 - 126 U/L   Total Bilirubin 0.2 (L) 0.3 - 1.2 mg/dL   GFR, Estimated >60 >60 mL/min    Comment: (NOTE) Calculated using the CKD-EPI Creatinine Equation (2021)    Anion gap 9 5 - 15    Comment: Performed at [facility], [place] [address] [place] [address], [place], [place] [address]  CBC     Status: Abnormal   Collection Time: 08/06/22  5:46 AM  Result Value Ref Range   WBC 7.5 4.0 - 10.5 K/uL   RBC 2.75 (L) 3.87 - 5.11 MIL/uL   Hemoglobin 9.2 (L) 12.0 - 15.0 g/dL   HCT 26.8 (L) 36.0 - 46.0 %   MCV 97.5 80.0 - 100.0 fL   MCH 33.5 26.0 - 34.0 pg   MCHC 34.3 30.0 - 36.0 g/dL   RDW 14.6 11.5 - 15.5 %  Platelets 235 150 - 400 K/uL   nRBC 0.0 0.0 - 0.2 %    Comment: Performed at [facility], [place] [address], [place], [place] [address]  Comprehensive metabolic panel     Status: Abnormal   Collection Time: 08/06/22  5:46 AM  Result Value Ref Range   Sodium 142 135 - 145 mmol/L   Potassium 3.1 (L) 3.5 - 5.1 mmol/L   Chloride 111 98 - 111 mmol/L   CO2 23 22 - 32 mmol/L   Glucose, Bld 76 70 - 99 mg/dL    Comment: Glucose reference range applies only to samples taken after fasting for at least 8 hours.   BUN 6 6 - 20 mg/dL   Creatinine, Ser 0.99 0.44 - 1.00 mg/dL   Calcium 7.8 (L) 8.9 - 10.3 mg/dL   Total Protein 5.0 (L) 6.5 - 8.1  g/dL   Albumin 2.4 (L) 3.5 - 5.0 g/dL   AST 22 15 - 41 U/L   ALT 18 0 - 44 U/L   Alkaline Phosphatase 90 38 - 126 U/L   Total Bilirubin 0.6 0.3 - 1.2 mg/dL   GFR, Estimated >60 >60 mL/min    Comment: (NOTE) Calculated using the CKD-EPI Creatinine Equation (2021)    Anion gap 8 5 - 15    Comment: Performed at [facility], [place] [address] [place], [place] [address]  CBC     Status: Abnormal   Collection Time: 08/07/22  5:15 AM  Result Value Ref Range   WBC 7.7 4.0 - 10.5 K/uL   RBC 2.94 (L) 3.87 - 5.11 MIL/uL   Hemoglobin 9.6 (L) 12.0 - 15.0 g/dL   HCT 29.0 (L) 36.0 - 46.0 %   MCV 98.6 80.0 - 100.0 fL   MCH 32.7 26.0 - 34.0 pg   MCHC 33.1 30.0 - 36.0 g/dL   RDW 14.6 11.5 - 15.5 %   Platelets 254 150 - 400 K/uL   nRBC 0.0 0.0 - 0.2 %    Comment: Performed at [facility], [place] [address] [place], [place] [address]  Comprehensive metabolic panel     Status: Abnormal   Collection Time: 08/07/22  5:15 AM  Result Value Ref Range   Sodium 140 135 - 145 mmol/L   Potassium 3.2 (L) 3.5 - 5.1 mmol/L   Chloride 111 98 - 111 mmol/L   CO2 21 (L) 22 - 32 mmol/L   Glucose, Bld 72 70 - 99 mg/dL    Comment: Glucose reference range applies only to samples taken after fasting for at least 8 hours.   BUN <5 (L) 6 - 20 mg/dL   Creatinine, Ser 0.94 0.44 - 1.00 mg/dL   Calcium 8.2 (L) 8.9 - 10.3 mg/dL   Total Protein 5.2 (L) 6.5 - 8.1 g/dL   Albumin 2.5 (L) 3.5 - 5.0 g/dL   AST 22 15 - 41 U/L   ALT 15 0 - 44 U/L   Alkaline Phosphatase 86 38 - 126 U/L   Total Bilirubin 0.5 0.3 - 1.2 mg/dL   GFR, Estimated >60 >60 mL/min    Comment: (NOTE) Calculated using the CKD-EPI Creatinine Equation (2021)    Anion gap 8 5 - 15    Comment: Performed at [facility], [place] [address], [place], [place] [address]     A/P:  POD9 s/p pCS, readmitted for postpartum severe preeclampsia. S/p magnesium. Persistent intermittent severe range blood pressures, remains asymptomatic. S/p lasix x2  doses.  BP improved on this regimen. Labetalol '800mg'$  TID, Procardia '60mg'$  BID, Hydralazine '25mg'$  QID, HCTZ 12.'5mg'$  qday. If BP remain mild range throughout today, anticipate discharge home. CBC, CMP have remained wnl throughout admission. Fioricet prn for HA. S/p IV iron. On zoloft '100mg'$  qday.  Hurshel Party, MD

## 2022-08-08 NOTE — Discharge Summary (Signed)
Physician Discharge Summary  Patient ID: Cynthia Robertson MRN: [id number] DOB/AGE: [birth year] 31 y.o.  Admit date: 08/04/2022 Discharge date: 08/08/2022  Admission Diagnoses: Postpartum severe preeclampsia  Discharge Diagnoses:  Principal Problem:   Severe pre-eclampsia Active Problems:   Severe pre-eclampsia, postpartum condition or complication   Discharged Condition: good  Hospital Course: Patient was readmitted for postpartum severe preeclampsia (likely superimposed on chronic hypertension) due to severe range Bps requiring IV treatment and headaches. On admission, her headache improved with fioricet. She received a dose of IV iron and her Zoloft dose was increased to '100mg'$ . Her BP regimen was uptitrated to Labetalol '800mg'$  TID, Procardia '60mg'$  BID, Hydralazine '25mg'$  QID, HCTZ 12.'5mg'$  qday. Her Bps were normal to mild range on this regimen. She remained asymptomatic from a PIH standpoint. She was discharged home in stable condition.  Consults: None  Significant Diagnostic Studies: none  Treatments: antihypertensives, lasix  Discharge Exam: Blood pressure 127/80, pulse (!) 108, temperature 98.1 F (36.7 C), temperature source Oral, resp. rate 18, height '5\' 8"'$  (1.727 m), weight 120.2 kg, SpO2 99 %, unknown if currently breastfeeding. See progress note from today  Disposition: Discharge disposition: 01-Home or Self Care        Allergies as of 08/08/2022   No Known Allergies      Medication List     STOP taking these medications    oxyCODONE 5 MG immediate release tablet Commonly known as: Oxy IR/ROXICODONE       TAKE these medications    acetaminophen 325 MG tablet Commonly known as: Tylenol Take 2 tablets (650 mg total) by mouth every 6 (six) hours as needed.   ferrous sulfate 325 (65 FE) MG tablet Take 325 mg by mouth daily with breakfast.   hydrALAZINE 25 MG tablet Commonly known as: APRESOLINE Take 1 tablet (25 mg total) by mouth every 6 (six)  hours.   hydrochlorothiazide 12.5 MG tablet Commonly known as: HYDRODIURIL Take 1 tablet (12.5 mg total) by mouth daily. Start taking on: August 09, 2022   ibuprofen 600 MG tablet Commonly known as: ADVIL Take 1 tablet (600 mg total) by mouth every 6 (six) hours as needed for moderate pain.   labetalol 200 MG tablet Commonly known as: NORMODYNE Take 4 tablets (800 mg total) by mouth every 8 (eight) hours for 10 days. What changed:  how much to take when to take this   NIFEdipine 60 MG 24 hr tablet Commonly known as: ADALAT CC Take 1 tablet (60 mg total) by mouth 2 (two) times daily.   omeprazole 20 MG capsule Commonly known as: PRILOSEC Take 20 mg by mouth daily.   PRENATAL VITAMINS PO Take by mouth.   senna-docusate 8.6-50 MG tablet Commonly known as: Senokot-S Take 2 tablets by mouth at bedtime as needed for mild constipation.   sertraline 50 MG tablet Commonly known as: Zoloft Take 1 tablet (50 mg total) by mouth daily.         Signed: Charlotta Newton 08/08/2022, 3:59 PM

## 2022-08-12 ENCOUNTER — Telehealth (HOSPITAL_COMMUNITY): Payer: Self-pay | Admitting: *Deleted

## 2022-08-12 NOTE — Telephone Encounter (Signed)
Mom reports feeling good. Incision healing well per mom. No concerns regarding herself at this time. EPDS=8 (hospital score=7) Mom reports baby is well. Feeding, peeing, and pooping without difficulty. Reviewed safe sleep. Mom has no concerns about baby at present.  Odis Hollingshead, RN 08-12-2022 at 11:42am

## 2022-09-06 ENCOUNTER — Telehealth (HOSPITAL_COMMUNITY): Payer: Self-pay | Admitting: Psychiatry

## 2022-09-07 ENCOUNTER — Encounter (HOSPITAL_COMMUNITY): Payer: Self-pay | Admitting: Psychiatry

## 2022-09-07 ENCOUNTER — Telehealth (HOSPITAL_BASED_OUTPATIENT_CLINIC_OR_DEPARTMENT_OTHER): Payer: 59 | Admitting: Psychiatry

## 2022-09-07 VITALS — Wt 260.0 lb

## 2022-09-07 DIAGNOSIS — F411 Generalized anxiety disorder: Secondary | ICD-10-CM | POA: Diagnosis not present

## 2022-09-07 DIAGNOSIS — F410 Panic disorder [episodic paroxysmal anxiety] without agoraphobia: Secondary | ICD-10-CM

## 2022-09-07 MED ORDER — SERTRALINE HCL 100 MG PO TABS: 100.0000 mg | ORAL_TABLET | Freq: Every day | ORAL | 0 refills | Status: DC

## 2022-09-07 NOTE — Progress Notes (Signed)
Virtual Visit via Telephone Note  I connected with Cynthia Robertson on 09/07/22 at 11:20 AM EST by telephone and verified that I am speaking with the correct person using two identifiers.  Location: Patient: Home Provider: Home Office   I discussed the limitations, risks, security and privacy concerns of performing an evaluation and management service by telephone and the availability of in person appointments. I also discussed with the patient that there may be a patient responsible charge related to this service. The patient expressed understanding and agreed to proceed.   History of Present Illness: Patient is evaluated by phone session.  Patient deliver 6 weeks ago a baby boy.  Patient told she had complication after the delivery and require 6 days hospitalization because of increased high blood pressure.  She admitted having a lot of anxiety, nervousness and having panic attack because of high blood pressure.  In the hospital medicines were adjusted and recommended to take the Zoloft 100 mg daily.  Patient was taking 50 mg however before the pregnancy she was taking 100 mg.  She feels things are slowly and gradually getting better and her blood pressure is now normalized.  Patient had a good support from her husband, mother, aunt and best friend who also delivered the baby 2 weeks before her baby was born.  She is tolerating Zoloft 100 mg daily.  She admitted sometimes crying spells when her baby cries because she does not know why babies crying.  Usually she takes a few minutes break and put the baby in a bassinet and walk for a few minutes and come back which helps most of the time.  She had excellent support from her family.  She is not scheduled to go back to work until January 8.  She has no tremors, shakes or any EPS.  She denies any anhedonia, feeling of hopelessness or worthlessness.  She denies any suicidal thoughts.  She really enjoyed the company of the baby and this is their first child.   Patient denies any hallucination or any paranoia.  She sleeps most of the night as baby sleeps too.  She is not drinking or using any illegal substances.  Her energy level is improving.  Her appetite is okay.  Past Psychiatric History:  H/O one inpatient at [facility] for anxiety and passive suicidal thoughts. Tried Celexa, Lexapro, and Xanax from PCP. H/O panic attacks. No h/o paranoia, hallucination, mania, violence, suicidal attempt.     Psychiatric Specialty Exam: Physical Exam  Review of Systems  Weight 260 lb (117.9 kg), unknown if currently breastfeeding.There is no height or weight on file to calculate BMI.  General Appearance: NA  Eye Contact:  NA  Speech:  Clear and Coherent and Normal Rate  Volume:  Normal  Mood:  Euthymic  Affect:  NA  Thought Process:  Goal Directed  Orientation:  Full (Time, Place, and Person)  Thought Content:  Logical  Suicidal Thoughts:  No  Homicidal Thoughts:  No  Memory:  Immediate;   Good Recent;   Good Remote;   Good  Judgement:  Intact  Insight:  Good  Psychomotor Activity:  NA  Concentration:  Concentration: Good and Attention Span: Good  Recall:  Good  Fund of Knowledge:  Good  Language:  Good  Akathisia:  No  Handed:  Right  AIMS (if indicated):     Assets:  Communication Skills Desire for Improvement Housing Resilience Social Support Transportation  ADL's:  Intact  Cognition:  WNL  Sleep:  ok      Assessment and Plan: His anxiety disorder.  Panic attacks.  I reviewed blood work results from recent hospitalization.  Her RBC is 2.94 on October 28, hemoglobin 9.6 but liver enzymes were normal.  Patient is now Zoloft 100 mg daily which was actual dose before she became pregnant.  Dose was recently increased after she readmitted for complications of postpartum eclampsia.  We discussed support system and patient feel comfortable with her support system.  Her mother lives close by.  She also had a good support from her best friend, her  aunt beside her husband.  I offered therapy but at this time patient feels things are going very well.  We will continue Zoloft 100 mg daily.  Recommend to call us back if she feels worsening of the symptoms.  We will follow-up in 3 months.  Follow Up Instructions:    I discussed the assessment and treatment plan with the patient. The patient was provided an opportunity to ask questions and all were answered. The patient agreed with the plan and demonstrated an understanding of the instructions.   The patient was advised to call back or seek an in-person evaluation if the symptoms worsen or if the condition fails to improve as anticipated.  Collaboration of Care: Other provider involved in patient's care AEB notes are available in epic to review.  Patient/Guardian was advised Release of Information must be obtained prior to any record release in order to collaborate their care with an outside provider. Patient/Guardian was advised if they have not already done so to contact the registration department to sign all necessary forms in order for [place] to release information regarding their care.   Consent: Patient/Guardian gives verbal consent for treatment and assignment of benefits for services provided during this visit. Patient/Guardian expressed understanding and agreed to proceed.    I provided 25 minutes of non-face-to-face time during this encounter.   [name], MD

## 2022-11-29 ENCOUNTER — Telehealth (HOSPITAL_COMMUNITY): Payer: 59 | Admitting: Psychiatry

## 2022-11-30 ENCOUNTER — Encounter (HOSPITAL_COMMUNITY): Payer: Self-pay | Admitting: Obstetrics and Gynecology

## 2022-12-01 ENCOUNTER — Telehealth (HOSPITAL_BASED_OUTPATIENT_CLINIC_OR_DEPARTMENT_OTHER): Payer: 59 | Admitting: Psychiatry

## 2022-12-01 ENCOUNTER — Encounter (HOSPITAL_COMMUNITY): Payer: Self-pay | Admitting: Psychiatry

## 2022-12-01 DIAGNOSIS — F410 Panic disorder [episodic paroxysmal anxiety] without agoraphobia: Secondary | ICD-10-CM | POA: Diagnosis not present

## 2022-12-01 DIAGNOSIS — F411 Generalized anxiety disorder: Secondary | ICD-10-CM | POA: Diagnosis not present

## 2022-12-01 MED ORDER — SERTRALINE HCL 100 MG PO TABS: 100.0000 mg | ORAL_TABLET | Freq: Every day | ORAL | 0 refills | Status: AC

## 2022-12-01 NOTE — Progress Notes (Signed)
[facility] MD Virtual Progress Note   Patient Location: Home Provider Location: Home Office  I connect with patient by telephone and verified that I am speaking with correct person by using two identifiers. I discussed the limitations of evaluation and management by telemedicine and the availability of in person appointments. I also discussed with the patient that there may be a patient responsible charge related to this service. The patient expressed understanding and agreed to proceed.  Cynthia Robertson [id number] 32 y.o.  12/01/2022 11:05 AM    History of Present Illness:  Patient is evaluated by phone session.  Patient not ready for video but promised to have video session on her next appointment.  She is doing well on her current medication.  Her depression and anxiety is good with the Zoloft 100 mg daily.  She denies any major panic attack.  She is pleased that she was able to come out of 2 antihypertensive medications since her blood pressure is better.  She still take labetalol and nifedipine.  She is not taking hydralazine and hydrochlorothiazide.  She also lost a lot of weight as more active and motivated to do things.  She started working since January 8 but need to go to office once a week.  She had a good support from her family.  Her baby is now 10 months old.  She had a good relationship with her husband.  She denies any crying spells or any feeling of hopelessness or worthlessness.  She has no tremors, shakes or any EPS.  She sleeps good.  She does not want to change the medication since her anxiety depression is stable.  She has no major panic attack.  Past Psychiatric History: H/O one inpatient at [facility] for anxiety and passive suicidal thoughts. Tried Celexa, Lexapro, and Xanax from PCP. H/O panic attacks. No h/o paranoia, hallucination, mania, violence, suicidal attempt.      Outpatient Encounter Medications as of 12/01/2022  Medication Sig   acetaminophen  (TYLENOL) 325 MG tablet Take 2 tablets (650 mg total) by mouth every 6 (six) hours as needed.   ferrous sulfate 325 (65 FE) MG tablet Take 325 mg by mouth daily with breakfast.   hydrALAZINE (APRESOLINE) 25 MG tablet Take 1 tablet (25 mg total) by mouth every 6 (six) hours.   hydrochlorothiazide (HYDRODIURIL) 12.5 MG tablet Take 1 tablet (12.5 mg total) by mouth daily.   ibuprofen (ADVIL) 600 MG tablet Take 1 tablet (600 mg total) by mouth every 6 (six) hours as needed for moderate pain.   labetalol (NORMODYNE) 200 MG tablet Take 4 tablets (800 mg total) by mouth every 8 (eight) hours for 10 days.   NIFEdipine (ADALAT CC) 60 MG 24 hr tablet Take 1 tablet (60 mg total) by mouth 2 (two) times daily.   omeprazole (PRILOSEC) 20 MG capsule Take 20 mg by mouth daily.   Prenatal Vit-Fe Fumarate-FA (PRENATAL VITAMINS PO) Take by mouth.   senna-docusate (SENOKOT-S) 8.6-50 MG tablet Take 2 tablets by mouth at bedtime as needed for mild constipation.   sertraline (ZOLOFT) 100 MG tablet Take 1 tablet (100 mg total) by mouth daily.   No facility-administered encounter medications on file as of 12/01/2022.    No results found for this or any previous visit (from the past 2160 hour(s)).   Psychiatric Specialty Exam: Physical Exam  Review of Systems  Weight 220 lb (99.8 kg), unknown if currently breastfeeding.There is no height or weight on file to calculate BMI.  General  Appearance: NA  Eye Contact:  NA  Speech:  Clear and Coherent and Normal Rate  Volume:  Normal  Mood:  Euthymic  Affect:  NA  Thought Process:  Goal Directed  Orientation:  Full (Time, Place, and Person)  Thought Content:  WDL  Suicidal Thoughts:  No  Homicidal Thoughts:  No  Memory:  Immediate;   Good Recent;   Good Remote;   Good  Judgement:  Good  Insight:  Good  Psychomotor Activity:  NA  Concentration:  Concentration: Good and Attention Span: Good  Recall:  Good  Fund of Knowledge:  Good  Language:  Good  Akathisia:   No  Handed:  Right  AIMS (if indicated):     Assets:  Communication Skills Desire for Improvement Housing Resilience Social Support Talents/Skills Transportation  ADL's:  Intact  Cognition:  WNL  Sleep:  ok     Assessment/Plan: Panic attack - Plan: sertraline (ZOLOFT) 100 MG tablet  GAD (generalized anxiety disorder) - Plan: sertraline (ZOLOFT) 100 MG tablet  Discussed current medication.  Her symptoms are stable on Zoloft 100 mg daily.  She has no major concerns or issues.  She had a good support from her friends, family, husband and her mother who lives nearby.  Discussed medication side effects and benefits.  Recommended to call us back if she has any question or any concern.  Patient had lost significant weight as watching her calorie intake and more active.  We will follow-up in 3 months.  Patient will do video session on her next appointment.   Follow Up Instructions:     I discussed the assessment and treatment plan with the patient. The patient was provided an opportunity to ask questions and all were answered. The patient agreed with the plan and demonstrated an understanding of the instructions.   The patient was advised to call back or seek an in-person evaluation if the symptoms worsen or if the condition fails to improve as anticipated.    Collaboration of Care: Other provider involved in patient's care AEB notes are available in epic to review.  Patient/Guardian was advised Release of Information must be obtained prior to any record release in order to collaborate their care with an outside provider. Patient/Guardian was advised if they have not already done so to contact the registration department to sign all necessary forms in order for [place] to release information regarding their care.   Consent: Patient/Guardian gives verbal consent for treatment and assignment of benefits for services provided during this visit. Patient/Guardian expressed understanding and agreed  to proceed.     I provided 19 minutes of non face to face time during this encounter.  [name], MD 12/01/2022

## 2022-12-06 ENCOUNTER — Telehealth (HOSPITAL_COMMUNITY): Payer: 59 | Admitting: Psychiatry

## 2022-12-15 ENCOUNTER — Other Ambulatory Visit (HOSPITAL_COMMUNITY): Payer: Self-pay | Admitting: Psychiatry

## 2022-12-15 DIAGNOSIS — F410 Panic disorder [episodic paroxysmal anxiety] without agoraphobia: Secondary | ICD-10-CM

## 2022-12-15 DIAGNOSIS — F411 Generalized anxiety disorder: Secondary | ICD-10-CM

## 2023-01-28 ENCOUNTER — Encounter (HOSPITAL_COMMUNITY): Payer: Self-pay | Admitting: Obstetrics and Gynecology

## 2023-02-28 ENCOUNTER — Telehealth (HOSPITAL_COMMUNITY): Payer: 59 | Admitting: Psychiatry

## 2023-03-30 ENCOUNTER — Other Ambulatory Visit (HOSPITAL_COMMUNITY): Payer: Self-pay | Admitting: Psychiatry

## 2023-03-30 DIAGNOSIS — F411 Generalized anxiety disorder: Secondary | ICD-10-CM

## 2023-03-30 DIAGNOSIS — F410 Panic disorder [episodic paroxysmal anxiety] without agoraphobia: Secondary | ICD-10-CM

## 2024-04-09 ENCOUNTER — Other Ambulatory Visit: Payer: Self-pay | Admitting: Family

## 2024-04-09 DIAGNOSIS — N611 Abscess of the breast and nipple: Secondary | ICD-10-CM

## 2024-04-11 ENCOUNTER — Ambulatory Visit
Admission: RE | Admit: 2024-04-11 | Discharge: 2024-04-11 | Disposition: A | Discharge status: Home / Self Care | Source: Ambulatory Visit | Attending: Family | Admitting: Family

## 2024-04-11 ENCOUNTER — Inpatient Hospital Stay
Admission: RE | Admit: 2024-04-11 | Discharge: 2024-04-11 | Discharge status: Home / Self Care | Source: Ambulatory Visit | Attending: Family | Admitting: Family

## 2024-04-11 DIAGNOSIS — N611 Abscess of the breast and nipple: Secondary | ICD-10-CM

## 2024-04-12 ENCOUNTER — Encounter: Payer: Self-pay | Admitting: Family

## 2024-04-12 ENCOUNTER — Other Ambulatory Visit: Payer: Self-pay | Admitting: Family

## 2024-04-12 DIAGNOSIS — N611 Abscess of the breast and nipple: Secondary | ICD-10-CM

## 2024-04-18 ENCOUNTER — Ambulatory Visit
Admission: RE | Admit: 2024-04-18 | Discharge: 2024-04-18 | Disposition: A | Discharge status: Home / Self Care | Source: Ambulatory Visit | Attending: Family | Admitting: Family

## 2024-04-18 DIAGNOSIS — N611 Abscess of the breast and nipple: Secondary | ICD-10-CM
